# Patient Record
Sex: Female | Born: 1985
Health system: Southern US, Community
[De-identification: ages and names within clinical notes are randomized; demographics above are authoritative.]

## PROBLEM LIST (undated history)

## (undated) DIAGNOSIS — G932 Benign intracranial hypertension: Secondary | ICD-10-CM

## (undated) DIAGNOSIS — I10 Essential (primary) hypertension: Secondary | ICD-10-CM

## (undated) DIAGNOSIS — T7840XA Allergy, unspecified, initial encounter: Secondary | ICD-10-CM

## (undated) DIAGNOSIS — E669 Obesity, unspecified: Secondary | ICD-10-CM

## (undated) DIAGNOSIS — F419 Anxiety disorder, unspecified: Secondary | ICD-10-CM

## (undated) DIAGNOSIS — G43909 Migraine, unspecified, not intractable, without status migrainosus: Secondary | ICD-10-CM

## (undated) HISTORY — DX: Essential (primary) hypertension: I10

## (undated) HISTORY — DX: Benign intracranial hypertension: G93.2

## (undated) HISTORY — DX: Obesity, unspecified: E66.9

## (undated) HISTORY — PX: TONSILLECTOMY: SUR1361

## (undated) HISTORY — DX: Anxiety disorder, unspecified: F41.9

## (undated) HISTORY — DX: Allergy, unspecified, initial encounter: T78.40XA

## (undated) HISTORY — PX: WISDOM TOOTH EXTRACTION: SHX21

## (undated) HISTORY — DX: Migraine, unspecified, not intractable, without status migrainosus: G43.909

---

## 2013-01-02 ENCOUNTER — Other Ambulatory Visit (HOSPITAL_COMMUNITY)
Admission: RE | Admit: 2013-01-02 | Discharge: 2013-01-02 | Disposition: A | Discharge status: Home / Self Care | Payer: BC Managed Care – PPO | Source: Ambulatory Visit | Attending: Gynecology | Admitting: Gynecology

## 2013-01-02 ENCOUNTER — Encounter: Payer: Self-pay | Admitting: Gynecology

## 2013-01-02 ENCOUNTER — Ambulatory Visit (INDEPENDENT_AMBULATORY_CARE_PROVIDER_SITE_OTHER): Payer: BC Managed Care – PPO | Admitting: Gynecology

## 2013-01-02 VITALS — BP 134/90 | Ht 64.0 in | Wt 273.0 lb

## 2013-01-02 DIAGNOSIS — R635 Abnormal weight gain: Secondary | ICD-10-CM | POA: Insufficient documentation

## 2013-01-02 DIAGNOSIS — IMO0001 Reserved for inherently not codable concepts without codable children: Secondary | ICD-10-CM

## 2013-01-02 DIAGNOSIS — Z113 Encounter for screening for infections with a predominantly sexual mode of transmission: Secondary | ICD-10-CM

## 2013-01-02 DIAGNOSIS — Z01419 Encounter for gynecological examination (general) (routine) without abnormal findings: Secondary | ICD-10-CM

## 2013-01-02 DIAGNOSIS — Z309 Encounter for contraceptive management, unspecified: Secondary | ICD-10-CM

## 2013-01-02 LAB — WET PREP FOR TRICH, YEAST, CLUE
Trich, Wet Prep: NONE SEEN
Yeast Wet Prep HPF POC: NONE SEEN

## 2013-01-02 MED ORDER — METRONIDAZOLE 500 MG PO TABS: 500.0000 mg | ORAL_TABLET | Freq: Two times a day (BID) | ORAL | Status: DC

## 2013-01-02 MED ORDER — NORGESTIMATE-ETH ESTRADIOL 0.25-35 MG-MCG PO TABS: 1.0000 | ORAL_TABLET | Freq: Every day | ORAL | Status: DC

## 2013-01-02 NOTE — Patient Instructions (Addendum)
Oral Contraception Use Oral contraceptives (OCs) are medicines taken to prevent pregnancy. OCs work by preventing the ovaries from releasing eggs. The hormones in OCs also cause the cervical mucus to thicken, preventing the sperm from entering the uterus. The hormones also cause the uterine lining to become thin, not allowing a fertilized egg to attach to the inside of the uterus. OCs are highly effective when taken exactly as prescribed. However, OCs do not prevent sexually transmitted diseases (STDs). Safe sex practices, such as using condoms along with an OC, can help prevent STDs.  Before taking OCs, you may have a physical exam and Pap test. Your caregiver may also order blood tests if necessary. Your caregiver will make sure you are a good candidate for oral contraception. Discuss with your caregiver the possible side effects of the OC you may be prescribed. When starting an OC, it can take 2 to 3 months for the body to adjust to the changes in hormone levels in your body.  HOW TO TAKE ORAL CONTRACEPTIVES Your caregiver may advise you on how to start taking the first cycle of OCs. Otherwise, you can:  Start on day 1 of your menstrual period. You will not need any backup contraceptive protection with this start time.  Start on the first Sunday after your menstrual period or the day you get your prescription. In these cases, you will need to use backup contraceptive protection for the first 7-day cycle. After you have started taking OCs:  If you forget to take 1 pill, take it as soon as you remember. Take the next pill at the regular time.  If you miss 2 or more pills, use backup birth control until your next menstrual period starts.  If you use a 28-day pack that contains inactive pills and you miss 1 of the last 7 pills (pills with no hormones), it will not matter. Throw away the rest of the non-hormone pills and start a new pill pack. No matter which day you start the OC, you will always start  a new pack on that same day of the week. Have an extra pack of OCs and a backup contraceptive method available in case you miss some pills or lose your OC pack. HOME CARE INSTRUCTIONS   Do not smoke.  Always use a condom to protect against STDs. OCs do not protect against STDs.  Use a calendar to mark your menstrual period days.  Read the information and directions that come with your OC. Talk to your caregiver if you have questions. SEEK MEDICAL CARE IF:   You develop nausea and vomiting.  You have abnormal vaginal discharge or bleeding.  You develop a rash.  You miss your menstrual period.  You are losing your hair.  You need treatment for mood swings or depression.  You get dizzy when taking the OC.  You develop acne from taking the OC.  You become pregnant. SEEK IMMEDIATE MEDICAL CARE IF:   You develop chest pain.  You develop shortness of breath.  You have an uncontrolled or severe headache.  You develop numbness or slurred speech.  You develop visual problems.  You develop pain, redness, and swelling in the legs. Document Released: 10/14/2011 Document Revised: 01/17/2012 Document Reviewed: 10/14/2011 [facility] Patient Information 2013 [place], [place].  Bacterial Vaginosis Bacterial vaginosis (BV) is a vaginal infection where the normal balance of bacteria in the vagina is disrupted. The normal balance is then replaced by an overgrowth of certain bacteria. There are several different kinds  of bacteria that can cause BV. BV is the most common vaginal infection in women of childbearing age. CAUSES   The cause of BV is not fully understood. BV develops when there is an increase or imbalance of harmful bacteria.  Some activities or behaviors can upset the normal balance of bacteria in the vagina and put women at increased risk including:  Having a new sex partner or multiple sex partners.  Douching.  Using an intrauterine device (IUD) for contraception.  It  is not clear what role sexual activity plays in the development of BV. However, women that have never had sexual intercourse are rarely infected with BV. Women do not get BV from toilet seats, bedding, swimming pools or from touching objects around them.  SYMPTOMS   Grey vaginal discharge.  A fish-like odor with discharge, especially after sexual intercourse.  Itching or burning of the vagina and vulva.  Burning or pain with urination.  Some women have no signs or symptoms at all. DIAGNOSIS  Your caregiver must examine the vagina for signs of BV. Your caregiver will perform lab tests and look at the sample of vaginal fluid through a microscope. They will look for bacteria and abnormal cells (clue cells), a pH test higher than 4.5, and a positive amine test all associated with BV.  RISKS AND COMPLICATIONS   Pelvic inflammatory disease (PID).  Infections following gynecology surgery.  Developing HIV.  Developing herpes virus. TREATMENT  Sometimes BV will clear up without treatment. However, all women with symptoms of BV should be treated to avoid complications, especially if gynecology surgery is planned. Female partners generally do not need to be treated. However, BV may spread between female sex partners so treatment is helpful in preventing a recurrence of BV.   BV may be treated with antibiotics. The antibiotics come in either pill or vaginal cream forms. Either can be used with nonpregnant or pregnant women, but the recommended dosages differ. These antibiotics are not harmful to the baby.  BV can recur after treatment. If this happens, a second round of antibiotics will often be prescribed.  Treatment is important for pregnant women. If not treated, BV can cause a premature delivery, especially for a pregnant woman who had a premature birth in the past. All pregnant women who have symptoms of BV should be checked and treated.  For chronic reoccurrence of BV, treatment with a type  of prescribed gel vaginally twice a week is helpful. HOME CARE INSTRUCTIONS   Finish all medication as directed by your caregiver.  Do not have sex until treatment is completed.  Tell your sexual partner that you have a vaginal infection. They should see their caregiver and be treated if they have problems, such as a mild rash or itching.  Practice safe sex. Use condoms. Only have 1 sex partner. PREVENTION  Basic prevention steps can help reduce the risk of upsetting the natural balance of bacteria in the vagina and developing BV:  Do not have sexual intercourse (be abstinent).  Do not douche.  Use all of the medicine prescribed for treatment of BV, even if the signs and symptoms go away.  Tell your sex partner if you have BV. That way, they can be treated, if needed, to prevent reoccurrence. SEEK MEDICAL CARE IF:   Your symptoms are not improving after 3 days of treatment.  You have increased discharge, pain, or fever. MAKE SURE YOU:   Understand these instructions.  Will watch your condition.  Will get help  right away if you are not doing well or get worse. FOR MORE INFORMATION  Division of STD Prevention (DSTDP), Centers for Disease Control and Prevention: [employer] American Social Health Association (ASHA): www.ashastd.org  Document Released: 10/25/2005 Document Revised: 01/17/2012 Document Reviewed: 04/17/2009 [facility] Patient Information 2013 [place], [place].

## 2013-01-02 NOTE — Progress Notes (Signed)
Anita Hodges [birth year] [id number]   History:    27 y.o.  for annual gyn exam who is new to the practice and wanted discuss contraceptive options. She stated she had been on several oral contraceptive pills in the past. She states her last Pap smear was in 2012 which was normal. She denies any prior history of abnormal Pap smears. She's currently weighing 273 pounds with a BMI of 46.86 kg/meter square. She stated that her Tdap vaccine was administered in 2012.  Past medical history,surgical history, family history and social history were all reviewed and documented in the EPIC chart.  Gynecologic History Patient's last menstrual period was 12/15/2012. Contraception: none Last Pap: 2012. Results were: normal Last mammogram: Not indicated. Results were: Not indicated  Obstetric History OB History   Grav Para Term Preterm Abortions TAB SAB Ect Mult Living                   ROS: A ROS was performed and pertinent positives and negatives are included in the history.  GENERAL: No fevers or chills. HEENT: No change in vision, no earache, sore throat or sinus congestion. NECK: No pain or stiffness. CARDIOVASCULAR: No chest pain or pressure. No palpitations. PULMONARY: No shortness of breath, cough or wheeze. GASTROINTESTINAL: No abdominal pain, nausea, vomiting or diarrhea, melena or bright red blood per rectum. GENITOURINARY: No urinary frequency, urgency, hesitancy or dysuria. MUSCULOSKELETAL: No joint or muscle pain, no back pain, no recent trauma. DERMATOLOGIC: No rash, no itching, no lesions. ENDOCRINE: No polyuria, polydipsia, no heat or cold intolerance. No recent change in weight. HEMATOLOGICAL: No anemia or easy bruising or bleeding. NEUROLOGIC: No headache, seizures, numbness, tingling or weakness. PSYCHIATRIC: No depression, no loss of interest in normal activity or change in sleep pattern.     Exam: chaperone present  BP 134/90  Ht 5\' 4"  (1.626 m)  Wt 273 lb (123.832 kg)  BMI 46.84  kg/m2  LMP 12/15/2012  Body mass index is 46.84 kg/(m^2).  General appearance : Well developed well nourished female. No acute distress HEENT: Neck supple, trachea midline, no carotid bruits, no thyroidmegaly Lungs: Clear to auscultation, no rhonchi or wheezes, or rib retractions  Heart: Regular rate and rhythm, no murmurs or gallops Breast:Examined in sitting and supine position were symmetrical in appearance, no palpable masses or tenderness,  no skin retraction, no nipple inversion, no nipple discharge, no skin discoloration, no axillary or supraclavicular lymphadenopathy Abdomen: no palpable masses or tenderness, no rebound or guarding Extremities: no edema or skin discoloration or tenderness  Pelvic:  Bartholin, Urethra, Skene Glands: Within normal limits             Vagina: No gross lesions or discharge  Cervix: No gross lesions or discharge  Uterus  anteverted, normal size, shape and consistency, non-tender and mobile  Adnexa  Without masses or tenderness  Anus and perineum  normal   Rectovaginal  normal sphincter tone without palpated masses or tenderness             Hemoccult not indicated   Wet prep today demonstrated moderate clue cells moderate white blood cells and too numerous to count bacteria  GC and Chlamydia culture pending at time of this dictation  Assessment/Plan:  27 y.o. female for annual exam who wanted to have an STD screen today. The wet prep demonstrated evidence of bacterial vaginosis. She will be placed on Flagyl 500 mg one by mouth twice a day for 5 days. As part of her STD workup  the following was also were: Hepatitis B, C., RPR, and HIV. Also a CBC and hemoglobin A1c and TSH were obtained along with a urinalysis and Pap smear. She was prescribe Sprintec 28 day oral contraceptive pill. Patient denies any bleeding or clotting disorders in her or any member of her family and has had no problems with oral contraceptive pills in the past. She was reminded on the  importance of monthly self breast examination.    Ok Edwards MD, 10:09 PM 01/02/2013

## 2013-01-03 ENCOUNTER — Other Ambulatory Visit: Payer: Self-pay | Admitting: Gynecology

## 2013-01-03 ENCOUNTER — Telehealth: Payer: Self-pay | Admitting: Gynecology

## 2013-01-03 ENCOUNTER — Telehealth: Payer: Self-pay

## 2013-01-03 DIAGNOSIS — D72829 Elevated white blood cell count, unspecified: Secondary | ICD-10-CM

## 2013-01-03 DIAGNOSIS — R7989 Other specified abnormal findings of blood chemistry: Secondary | ICD-10-CM

## 2013-01-03 LAB — URINALYSIS W MICROSCOPIC + REFLEX CULTURE
Bacteria, UA: NONE SEEN
Bilirubin Urine: NEGATIVE
Casts: NONE SEEN
Glucose, UA: NEGATIVE mg/dL
Hgb urine dipstick: NEGATIVE
Ketones, ur: NEGATIVE mg/dL
Nitrite: NEGATIVE
Protein, ur: NEGATIVE mg/dL
Specific Gravity, Urine: >1.03 — ABNORMAL HIGH (ref 1.005–1.030)
Urobilinogen, UA: 0.2 mg/dL (ref 0.0–1.0)
pH: 5 (ref 5.0–8.0)

## 2013-01-03 LAB — URINE CULTURE

## 2013-01-03 LAB — CBC WITH DIFFERENTIAL/PLATELET
Basophils Absolute: 0 10*3/uL (ref 0.0–0.1)
Basophils Relative: 0 % (ref 0–1)
Eosinophils Absolute: 0.1 10*3/uL (ref 0.0–0.7)
Eosinophils Relative: 1 % (ref 0–5)
HCT: 41.4 % (ref 36.0–46.0)
Hemoglobin: 14.2 g/dL (ref 12.0–15.0)
Lymphocytes Relative: 26 % (ref 12–46)
Lymphs Abs: 3.1 10*3/uL (ref 0.7–4.0)
MCH: 27.9 pg (ref 26.0–34.0)
MCHC: 34.3 g/dL (ref 30.0–36.0)
MCV: 81.3 fL (ref 78.0–100.0)
Monocytes Absolute: 0.9 10*3/uL (ref 0.1–1.0)
Monocytes Relative: 8 % (ref 3–12)
Neutro Abs: 7.7 10*3/uL (ref 1.7–7.7)
Neutrophils Relative %: 65 % (ref 43–77)
Platelets: 434 10*3/uL — ABNORMAL HIGH (ref 150–400)
RBC: 5.09 MIL/uL (ref 3.87–5.11)
RDW: 13.8 % (ref 11.5–15.5)
WBC: 11.9 10*3/uL — ABNORMAL HIGH (ref 4.0–10.5)

## 2013-01-03 LAB — HEMOGLOBIN A1C
Hgb A1c MFr Bld: 5.6 % (ref <5.7)
Mean Plasma Glucose: 114 mg/dL (ref <117)

## 2013-01-03 LAB — TSH: TSH: 2.55 u[IU]/mL (ref 0.350–4.500)

## 2013-01-03 LAB — HEPATITIS C ANTIBODY: HCV Ab: NEGATIVE

## 2013-01-03 LAB — HIV ANTIBODY (ROUTINE TESTING W REFLEX): HIV: NONREACTIVE

## 2013-01-03 LAB — HEPATITIS B SURFACE ANTIGEN: Hepatitis B Surface Ag: NEGATIVE

## 2013-01-03 LAB — GC/CHLAMYDIA PROBE AMP
CT Probe RNA: NEGATIVE
GC Probe RNA: NEGATIVE

## 2013-01-03 LAB — RPR

## 2013-01-03 NOTE — Telephone Encounter (Signed)
I LM on pt cell phone that her [facility] ins will cover either the Paraguard or Mirena IUD & insertion at 100%,no copay. She was advised to call & let us know which IUD she wants and to call first day of her next cycle to insert.WL

## 2013-01-03 NOTE — Telephone Encounter (Signed)
error 

## 2013-01-29 ENCOUNTER — Other Ambulatory Visit: Payer: Self-pay

## 2013-01-29 ENCOUNTER — Encounter: Payer: Self-pay | Admitting: Gynecology

## 2013-01-29 ENCOUNTER — Ambulatory Visit (INDEPENDENT_AMBULATORY_CARE_PROVIDER_SITE_OTHER): Payer: BC Managed Care – PPO | Admitting: Gynecology

## 2013-01-29 VITALS — BP 134/86

## 2013-01-29 DIAGNOSIS — R635 Abnormal weight gain: Secondary | ICD-10-CM

## 2013-01-29 DIAGNOSIS — N912 Amenorrhea, unspecified: Secondary | ICD-10-CM

## 2013-01-29 DIAGNOSIS — R7989 Other specified abnormal findings of blood chemistry: Secondary | ICD-10-CM

## 2013-01-29 DIAGNOSIS — D473 Essential (hemorrhagic) thrombocythemia: Secondary | ICD-10-CM

## 2013-01-29 DIAGNOSIS — D72829 Elevated white blood cell count, unspecified: Secondary | ICD-10-CM

## 2013-01-29 DIAGNOSIS — E663 Overweight: Secondary | ICD-10-CM | POA: Insufficient documentation

## 2013-01-29 LAB — CBC WITH DIFFERENTIAL/PLATELET
Basophils Absolute: 0.1 10*3/uL (ref 0.0–0.1)
Basophils Relative: 1 % (ref 0–1)
Eosinophils Absolute: 0.1 10*3/uL (ref 0.0–0.7)
Eosinophils Relative: 1 % (ref 0–5)
HCT: 39.7 % (ref 36.0–46.0)
Hemoglobin: 13.4 g/dL (ref 12.0–15.0)
Lymphocytes Relative: 28 % (ref 12–46)
Lymphs Abs: 3.1 10*3/uL (ref 0.7–4.0)
MCH: 27.7 pg (ref 26.0–34.0)
MCHC: 33.8 g/dL (ref 30.0–36.0)
MCV: 82 fL (ref 78.0–100.0)
Monocytes Absolute: 0.6 10*3/uL (ref 0.1–1.0)
Monocytes Relative: 6 % (ref 3–12)
Neutro Abs: 7.2 10*3/uL (ref 1.7–7.7)
Neutrophils Relative %: 64 % (ref 43–77)
Platelets: 387 10*3/uL (ref 150–400)
RBC: 4.84 MIL/uL (ref 3.87–5.11)
RDW: 14.2 % (ref 11.5–15.5)
WBC: 11.1 10*3/uL — ABNORMAL HIGH (ref 4.0–10.5)

## 2013-01-29 LAB — PREGNANCY, URINE: Preg Test, Ur: NEGATIVE

## 2013-01-29 MED ORDER — PHENTERMINE HCL 37.5 MG PO CAPS: 37.5000 mg | ORAL_CAPSULE | ORAL | Status: DC

## 2013-01-29 NOTE — Progress Notes (Signed)
Patient presented to the office today stating that she has not had a menstrual cycle since February 7. Patient states she otherwise has had normal cycles. She was seen in the office for her annual exam on February 25 and was given a prescription for sprintec 28 day oral contraceptive pill to start on the second day of her menses. She has not had a menstrual cycle to start the oral contraceptive pill and stated that at home she had a urine pregnancy test one week ago was negative. Patient denies any nausea vomiting occasional breast tenderness. She has had unprotected intercourse. She denies any nipple discharge or any double vision occasional frontal headaches but she attributes to seasonal allergy.  Patient weighs 273 pounds with a BMI of 46.84 Today's blood pressure 134/86 Heart regular rate and rhythm no murmurs or gallops Neck: No carotid bruits Lungs: Clear to auscultation no rhonchi or wheezes  Urine pregnancy test was negative in the office today.  Exam: Pelvic: Bartholin urethra Skene was within normal limits Vagina: No lesions or discharge Cervix: No lesions or discharge Uterus: Anteverted normal size shape and consistency Adnexa: No palpable masses or tenderness Rectal exam: Not done  Patient stated that several years ago she was on phentermine 37.5 mg daily for 2 months and lost significant amount of weight. She does not exercise on a regular basis.  Assessment/plan: Patient morbidly obese may be contributing to her anovulatory cycle. Patient had a normal TSH at time of her annual exam as well as blood sugar but her CBC demonstrated slightly elevated white blood count and patient stated she had just got over an upper respiratory tract infection so we will be checking her CBC today. We'll check her prolactin level today. She is going to be referred to the [facility] nutrition center for nutritional guidance. I've also given the telephone number of the bariatric clinic for her to schedule an  appointment. I  do believe she would be an ideal candidate for gastric lap banding. Meanwhile shew ould like to go on phentermine. I will prescribe her 37.5 mg to take 1 by mouth daily for 3 months but she will return to the office monthly for cardiac and pulmonary auscultation. Patient was counseled as to the potential risk of pulmonary hypertension and cardiac valvular heart disease when taken for long periods of time. Patient fully understands and accepts.literature information was provided on the medication. We'll also obtain a qualitative beta hCG today. If it is negative she will take Provera 10 mg daily for the next 5-10 days to initiate her menstrual cycle. On the second day of her cycle she will start her Sprintec 28 day oral contraceptive pill. She was provided as well with literature formation on diet and exercise.

## 2013-01-29 NOTE — Patient Instructions (Addendum)
Exercise to Lose Weight Exercise and a healthy diet may help you lose weight. Your doctor may suggest specific exercises. EXERCISE IDEAS AND TIPS  Choose low-cost things you enjoy doing, such as walking, bicycling, or exercising to workout videos.   Take stairs instead of the elevator.   Walk during your lunch break.   Park your car further away from work or school.   Go to a gym or an exercise class.   Start with 5 to 10 minutes of exercise each day. Build up to 30 minutes of exercise 4 to 6 days a week.   Wear shoes with good support and comfortable clothes.   Stretch before and after working out.   Work out until you breathe harder and your heart beats faster.   Drink extra water when you exercise.   Do not do so much that you hurt yourself, feel dizzy, or get very short of breath.  Exercises that burn about 150 calories:  Running 1  miles in 15 minutes.   Playing volleyball for 45 to 60 minutes.   Washing and waxing a car for 45 to 60 minutes.   Playing touch football for 45 minutes.   Walking 1  miles in 35 minutes.   Pushing a stroller 1  miles in 30 minutes.   Playing basketball for 30 minutes.   Raking leaves for 30 minutes.   Bicycling 5 miles in 30 minutes.   Walking 2 miles in 30 minutes.   Dancing for 30 minutes.   Shoveling snow for 15 minutes.   Swimming laps for 20 minutes.   Walking up stairs for 15 minutes.   Bicycling 4 miles in 15 minutes.   Gardening for 30 to 45 minutes.   Jumping rope for 15 minutes.   Washing windows or floors for 45 to 60 minutes.  Document Released: 11/27/2010 Document Revised: 07/07/2011 Document Reviewed: 11/27/2010 [facility] Patient Information 2012 [place].                                          Patient information: High cholesterol (The Basics)  What is cholesterol? - Cholesterol is a substance that is found in the blood. Everyone has some. It is needed for good health. The problem is, people  sometimes have too much cholesterol. Compared with people with normal cholesterol, people with high cholesterol have a higher risk of heart attacks, strokes, and other health problems. The higher your cholesterol, the higher your risk of these problems. Cholesterol levels in your body are determined significantly by your diet. Cholesterol levels may also be related to heart disease. The following material helps to explain this relationship and discusses what you can do to help keep your heart healthy. Not all cholesterol is bad. Low-density lipoprotein (LDL) cholesterol is the "bad" cholesterol. It may cause fatty deposits to build up inside your arteries. High-density lipoprotein (HDL) cholesterol is "good." It helps to remove the "bad" LDL cholesterol from your blood. Cholesterol is a very important risk factor for heart disease. Other risk factors are high blood pressure, smoking, stress, heredity, and weight.  The heart muscle gets its supply of blood through the coronary arteries. If your LDL cholesterol is high and your HDL cholesterol is low, you are at risk for having fatty deposits build up in your coronary arteries. This leaves less room through which blood can flow. Without sufficient blood and  oxygen, the heart muscle cannot function properly and you may feel chest pains (angina pectoris). When a coronary artery closes up entirely, a part of the heart muscle may die, causing a heart attack (myocardial infarction).  CHECKING CHOLESTEROL When your caregiver sends your blood to a lab to be analyzed for cholesterol, a complete lipid (fat) profile may be done. With this test, the total amount of cholesterol and levels of LDL and HDL are determined. Triglycerides are a type of fat that circulates in the blood and can also be used to determine heart disease risk. Are there different types of cholesterol? - Yes, there are a few different types. If you get a cholesterol test, you may hear your doctor or  nurse talk about: Total cholesterol  LDL cholesterol - Some people call this the "bad" cholesterol. That's because having high LDL levels raises your risk of heart attacks, strokes, and other health problems.  HDL cholesterol - Some people call this the "good" cholesterol. That's because having high HDL levels lowers your risk of heart attacks, strokes, and other health problems.  Non-HDL cholesterol - Non-HDL cholesterol is your total cholesterol minus your HDL cholesterol.  Triglycerides - Triglycerides are not cholesterol. They are a type of fat. But they often get measured when cholesterol is measured. (Having high triglycerides also seems to increase the risk of heart attacks and strokes.)   Keep in mind, though, that many people who cannot meet these goals still have a low risk of heart attacks and strokes. What should I do if my doctor tells me I have high cholesterol? - Ask your doctor what your overall risk of heart attacks and strokes is. High cholesterol, by itself, is not always a reason to worry. Having high cholesterol is just one of many things that can increase your risk of heart attacks and strokes. Other factors that increase your risk include:  Cigarette smoking  High blood pressure  Having a parent, sister, or brother who got heart disease at a young age (Young, in this case, means younger than 55 for men and younger than 65 for women.)  Being a man (Women are at risk, too, but men have a higher risk.)  Older age  If you are at high risk of heart attacks and strokes, having high cholesterol is a problem. On the other hand, if you have are at low risk, having high cholesterol may not mean much. Should I take medicine to lower cholesterol? - Not everyone who has high cholesterol needs medicines. Your doctor or nurse will decide if you need them based on your age, family history, and other health concerns.  You should probably take a cholesterol-lowering medicine called a statin if  you: Already had a heart attack or stroke  Have known heart disease  Have diabetes  Have a condition called peripheral artery disease, which makes it painful to walk, and happens when the arteries in your legs get clogged with fatty deposits  Have an abdominal aortic aneurysm, which is a widening of the main artery in the belly  Most people with any of the conditions listed above should take a statin no matter what their cholesterol level is. If your doctor or nurse puts you on a statin, stay on it. The medicine may not make you feel any different. But it can help prevent heart attacks, strokes, and death.  Can I lower my cholesterol without medicines? - Yes, you can lower your cholesterol some by:  Avoiding red meat, butter,   fried foods, cheese, and other foods that have a lot of saturated fat  Losing weight (if you are overweight)  Being more active Even if these steps do little to change your cholesterol, they can improve your health in many ways.                                                   Cholesterol Control Diet  CONTROLLING CHOLESTEROL WITH DIET Although exercise and lifestyle factors are important, your diet is key. That is because certain foods are known to raise cholesterol and others to lower it. The goal is to balance foods for their effect on cholesterol and more importantly, to replace saturated and trans fat with other types of fat, such as monounsaturated fat, polyunsaturated fat, and omega-3 fatty acids. On average, a person should consume no more than 15 to 17 g of saturated fat daily. Saturated and trans fats are considered "bad" fats, and they will raise LDL cholesterol. Saturated fats are primarily found in animal products such as meats, butter, and cream. However, that does not mean you need to sacrifice all your favorite foods. Today, there are good tasting, low-fat, low-cholesterol substitutes for most of the things you like to eat. Choose low-fat or nonfat  alternatives. Choose round or loin cuts of red meat, since these types of cuts are lowest in fat and cholesterol. Chicken (without the skin), fish, veal, and ground [place] breast are excellent choices. Eliminate fatty meats, such as hot dogs and salami. Even shellfish have little or no saturated fat. Have a 3 oz (85 g) portion when you eat lean meat, poultry, or fish. Trans fats are also called "partially hydrogenated oils." They are oils that have been scientifically manipulated so that they are solid at room temperature resulting in a longer shelf life and improved taste and texture of foods in which they are added. Trans fats are found in stick margarine, some tub margarines, cookies, crackers, and baked goods.  When baking and cooking, oils are an excellent substitute for butter. The monounsaturated oils are especially beneficial since it is believed they lower LDL and raise HDL. The oils you should avoid entirely are saturated tropical oils, such as coconut and palm.  Remember to eat liberally from food groups that are naturally free of saturated and trans fat, including fish, fruit, vegetables, beans, grains (barley, rice, couscous, bulgur wheat), and pasta (without cream sauces).  IDENTIFYING FOODS THAT LOWER CHOLESTEROL  Soluble fiber may lower your cholesterol. This type of fiber is found in fruits such as apples, vegetables such as broccoli, potatoes, and carrots, legumes such as beans, peas, and lentils, and grains such as barley. Foods fortified with plant sterols (phytosterol) may also lower cholesterol. You should eat at least 2 g per day of these foods for a cholesterol lowering effect.  Read package labels to identify low-saturated fats, trans fats free, and low-fat foods at the supermarket. Select cheeses that have only 2 to 3 g saturated fat per ounce. Use a heart-healthy tub margarine that is free of trans fats or partially hydrogenated oil. When buying baked goods (cookies, crackers), avoid  partially hydrogenated oils. Breads and muffins should be made from whole grains (whole-wheat or whole oat flour, instead of "flour" or "enriched flour"). Buy non-creamy canned soups with reduced salt and no added fats.  FOOD PREPARATION TECHNIQUES  Never deep-fry. If you must fry, either stir-fry, which uses very little fat, or use non-stick cooking sprays. When possible, broil, bake, or roast meats, and steam vegetables. Instead of dressing vegetables with butter or margarine, use lemon and herbs, applesauce and cinnamon (for squash and sweet potatoes), nonfat yogurt, salsa, and low-fat dressings for salads.  LOW-SATURATED FAT / LOW-FAT FOOD SUBSTITUTES Meats / Saturated Fat (g)  Avoid: Steak, marbled (3 oz/85 g) / 11 g   Choose: Steak, lean (3 oz/85 g) / 4 g   Avoid: Hamburger (3 oz/85 g) / 7 g   Choose: Hamburger, lean (3 oz/85 g) / 5 g   Avoid: Ham (3 oz/85 g) / 6 g   Choose: Ham, lean cut (3 oz/85 g) / 2.4 g   Avoid: Chicken, with skin, dark meat (3 oz/85 g) / 4 g   Choose: Chicken, skin removed, dark meat (3 oz/85 g) / 2 g   Avoid: Chicken, with skin, light meat (3 oz/85 g) / 2.5 g   Choose: Chicken, skin removed, light meat (3 oz/85 g) / 1 g  Dairy / Saturated Fat (g)  Avoid: Whole milk (1 cup) / 5 g   Choose: Low-fat milk, 2% (1 cup) / 3 g   Choose: Low-fat milk, 1% (1 cup) / 1.5 g   Choose: Skim milk (1 cup) / 0.3 g   Avoid: Hard cheese (1 oz/28 g) / 6 g   Choose: Skim milk cheese (1 oz/28 g) / 2 to 3 g   Avoid: Cottage cheese, 4% fat (1 cup) / 6.5 g   Choose: Low-fat cottage cheese, 1% fat (1 cup) / 1.5 g   Avoid: Ice cream (1 cup) / 9 g   Choose: Sherbet (1 cup) / 2.5 g   Choose: Nonfat frozen yogurt (1 cup) / 0.3 g   Choose: Frozen fruit bar / trace   Avoid: Whipped cream (1 tbs) / 3.5 g   Choose: Nondairy whipped topping (1 tbs) / 1 g  Condiments / Saturated Fat (g)  Avoid: Mayonnaise (1 tbs) / 2 g   Choose: Low-fat mayonnaise (1 tbs) / 1 g    Avoid: Butter (1 tbs) / 7 g   Choose: Extra light margarine (1 tbs) / 1 g   Avoid: Coconut oil (1 tbs) / 11.8 g   Choose: Olive oil (1 tbs) / 1.8 g   Choose: Corn oil (1 tbs) / 1.7 g   Choose: Safflower oil (1 tbs) / 1.2 g   Choose: Sunflower oil (1 tbs) / 1.4 g   Choose: Soybean oil (1 tbs) / 2.4 g   Choose: Canola oil (1 tbs) / 1 g                                                                                      Phentermine tablets or capsules What is this medicine? PHENTERMINE (FEN ter meen) decreases your appetite. It is used with a reduced calorie diet and exercise to help you lose weight. This medicine may be used for other purposes; ask your health care provider or pharmacist if you have questions. What should I tell my health care  provider before I take this medicine? They need to know if you have any of these conditions: -agitation -glaucoma -heart disease -high blood pressure -history of substance abuse -lung disease called Primary Pulmonary Hypertension (PPH) -taken an MAOI like Carbex, Eldepryl, Marplan, Nardil, or Parnate in last 14 days -thyroid disease -an unusual or allergic reaction to phentermine, other medicines, foods, dyes, or preservatives -pregnant or trying to get pregnant -breast-feeding How should I use this medicine? Take this medicine by mouth with a glass of water. Follow the directions on the prescription label. This medicine is usually taken 30 minutes before or 1 to 2 hours after breakfast. Avoid taking this medicine in the evening. It may interfere with sleep. Take your doses at regular intervals. Do not take your medicine more often than directed. Talk to your pediatrician regarding the use of this medicine in children. Special care may be needed. Overdosage: If you think you have taken too much of this medicine contact a poison control center or emergency room at once. NOTE: This medicine is only for you. Do not share this medicine with  others. What if I miss a dose? If you miss a dose, take it as soon as you can. If it is almost time for your next dose, take only that dose. Do not take double or extra doses. What may interact with this medicine? Do not take this medicine with any of the following medications: -duloxetine -MAOIs like Carbex, Eldepryl, Marplan, Nardil, and Parnate -medicines for colds or breathing difficulties like pseudoephedrine or phenylephrine -procarbazine -sibutramine -SSRIs like citalopram, escitalopram, fluoxetine, fluvoxamine, paroxetine, and sertraline -stimulants like dexmethylphenidate, methylphenidate or modafinil -venlafaxine This medicine may also interact with the following medications: -medicines for diabetes This list may not describe all possible interactions. Give your health care provider a list of all the medicines, herbs, non-prescription drugs, or dietary supplements you use. Also tell them if you smoke, drink alcohol, or use illegal drugs. Some items may interact with your medicine. What should I watch for while using this medicine? Notify your physician immediately if you become short of breath while doing your normal activities. Do not take this medicine within 6 hours of bedtime. It can keep you from getting to sleep. Avoid drinks that contain caffeine and try to stick to a regular bedtime every night. This medicine was intended to be used in addition to a healthy diet and exercise. The best results are achieved this way. This medicine is only indicated for short-term use. Eventually your weight loss may level out. At that point, the drug will only help you maintain your new weight. Do not increase or in any way change your dose without consulting your doctor. You may get drowsy or dizzy. Do not drive, use machinery, or do anything that needs mental alertness until you know how this medicine affects you. Do not stand or sit up quickly, especially if you are an older patient. This  reduces the risk of dizzy or fainting spells. Alcohol may increase dizziness and drowsiness. Avoid alcoholic drinks. What side effects may I notice from receiving this medicine? Side effects that you should report to your doctor or health care professional as soon as possible: -chest pain, palpitations -depression or severe changes in mood -increased blood pressure -irritability -nervousness or restlessness -severe dizziness -shortness of breath -problems urinating -unusual swelling of the legs -vomiting Side effects that usually do not require medical attention (report to your doctor or health care professional if they continue or are bothersome): -blurred  vision or other eye problems -changes in sexual ability or desire -constipation or diarrhea -difficulty sleeping -dry mouth or unpleasant taste -headache -nausea This list may not describe all possible side effects. Call your doctor for medical advice about side effects. You may report side effects to FDA at 1-800-FDA-1088. Where should I keep my medicine? Keep out of the reach of children. This medicine can be abused. Keep your medicine in a safe place to protect it from theft. Do not share this medicine with anyone. Selling or giving away this medicine is dangerous and against the law. Store at room temperature between 20 and 25 degrees C (68 and 77 degrees F). Keep container tightly closed. Throw away any unused medicine after the expiration date. NOTE: This sheet is a summary. It may not cover all possible information. If you have questions about this medicine, talk to your doctor, pharmacist, or health care provider.  2012, [employer]. (12/09/2010 11:02:44 AM)

## 2013-01-30 LAB — HCG, SERUM, QUALITATIVE: Preg, Serum: NEGATIVE

## 2013-01-31 ENCOUNTER — Telehealth: Payer: Self-pay | Admitting: *Deleted

## 2013-01-31 MED ORDER — MEDROXYPROGESTERONE ACETATE 10 MG PO TABS: 10.0000 mg | ORAL_TABLET | Freq: Every day | ORAL | Status: DC

## 2013-01-31 NOTE — Telephone Encounter (Signed)
Pt said provera never was received at pharmacy. Per office note provera10 mg daily for the next 5-10 days to initiate her menstrual cycle then to start Sprintec on 2nd day of cycle. Pt qualitative beta hCG was negative informed okay to start medication per office note on 01/29/13.

## 2013-02-02 ENCOUNTER — Telehealth: Payer: Self-pay | Admitting: *Deleted

## 2013-02-02 ENCOUNTER — Encounter: Payer: Self-pay | Admitting: Gynecology

## 2013-02-02 DIAGNOSIS — E663 Overweight: Secondary | ICD-10-CM

## 2013-02-02 NOTE — Telephone Encounter (Signed)
Message copied by Aura Camps on Fri Feb 02, 2013 10:44 AM ------      Message from: Richardson Chiquito      Created: Fri Feb 02, 2013 10:26 AM       Victorino Dike can you see the pt request advice on this patient? She is asking about a referral to [facility] nutrition center. Did you get a message from JF?       Sherrilyn Rist ------

## 2013-02-02 NOTE — Telephone Encounter (Signed)
Referral placed for [facility] they will contact patient to schedule.

## 2013-02-06 NOTE — Telephone Encounter (Signed)
appt 02/08/13 @ 8:00 am

## 2013-02-08 ENCOUNTER — Encounter: Payer: Self-pay | Admitting: *Deleted

## 2013-02-08 ENCOUNTER — Encounter: Payer: BC Managed Care – PPO | Attending: Gynecology | Admitting: *Deleted

## 2013-02-08 VITALS — Ht 65.0 in | Wt 274.7 lb

## 2013-02-08 DIAGNOSIS — Z713 Dietary counseling and surveillance: Secondary | ICD-10-CM | POA: Insufficient documentation

## 2013-02-08 DIAGNOSIS — E663 Overweight: Secondary | ICD-10-CM

## 2013-02-08 DIAGNOSIS — E669 Obesity, unspecified: Secondary | ICD-10-CM | POA: Insufficient documentation

## 2013-02-08 NOTE — Progress Notes (Signed)
  Medical Nutrition Therapy:  Appt start time: 0800 end time:  0900.  Assessment:  Primary concerns today: patient here for obesity. Lives with parents, Mom shops and prepares meals. Works at [employer] from 8:30 to 5:30 Monday - Friday. Has an hour commute each way. Does clean up house, enjoys being with friends and boyfriend. Travels to [place] each weekend to see him.  MEDICATIONS: see list   DIETARY INTAKE:  Usual eating pattern includes 3 meals and 0 snacks per day.  Everyday foods include good variety of all food groups.  Avoided foods include fried foods and regular sodas now.    24-hr recall:  B ( AM): biscuit with sausage or chicken OR Special K Bar x 1, water  Snk ( AM): none  L (11 AM): brings from home; left overs OR frozen low calorie meal, water Snk ( PM): none D ( PM): meat, starch and occasionally a vegetable, bread and occasional salad with vinaigrette dressing  Snk ( PM): none Beverages: water, tea with sweetener like Splenda, diet [facility] very rarely  Usual physical activity: nothing right now.  Estimated energy needs: 1400 calories 158 g carbohydrates 105 g protein 39 g fat  Progress Towards Goal(s):  In progress.   Nutritional Diagnosis:  NI-1.5 Excessive energy intake As related to activity level.  As evidenced by BMI of 45.8.    Intervention:  Nutrition counseling for weight loss initiated. Discussed use of Carb Counting as method of increasing her awareness of appropriate food choices, reading food labels, and benefits of increased activity. Asked her to consider what ways she would prefer to increase her activity for 15 minutes every day by the next visit. She states she prefers exercising alone but doesn't have a preference of being inside or outside. . Plan:  Aim for 3 Carb Choices per meal (45 grams) +/- 1 either way  Aim for 0-1 Carbs per snack if hungry Aim for 3 Fat Servings per meal (15 grams) as tolerated  Consider reading food labels for Total  Carbohydrate and Fat Grams of foods Consider options for increasing your activity level   Handouts given during visit include: Carb Counting and Food Label handouts Meal Plan Card  APPs for nutrition and activity information  Monitoring/Evaluation:  Dietary intake, exercise, reading food labels, and body weight in 4 week(s).

## 2013-02-08 NOTE — Patient Instructions (Signed)
Plan:  Aim for 3 Carb Choices per meal (45 grams) +/- 1 either way  Aim for 0-1 Carbs per snack if hungry Aim for 3 Fat Servings per meal (15 grams) as tolerated  Consider reading food labels for Total Carbohydrate and Fat Grams of foods Consider options for increasing your activity level

## 2013-02-28 ENCOUNTER — Encounter: Payer: Self-pay | Admitting: Gynecology

## 2013-02-28 ENCOUNTER — Ambulatory Visit (INDEPENDENT_AMBULATORY_CARE_PROVIDER_SITE_OTHER): Payer: BC Managed Care – PPO | Admitting: Gynecology

## 2013-02-28 VITALS — BP 136/88 | Wt 276.0 lb

## 2013-02-28 DIAGNOSIS — I10 Essential (primary) hypertension: Secondary | ICD-10-CM

## 2013-02-28 DIAGNOSIS — E663 Overweight: Secondary | ICD-10-CM

## 2013-02-28 NOTE — Patient Instructions (Addendum)
Bariatric Surgery (Gastrointestinal Surgery for Severe Obesity) Severe obesity is a longstanding condition. It is difficult to treat through diet and exercise alone. Gastrointestinal surgery is the best option for people who are severely obese and cannot lose weight by traditional means, or who suffer from serious obesity-related health problems. The surgery promotes weight loss by decreasing the absorption of food and, in some operations, interrupting the digestive process. As in other treatments for obesity, the best results are achieved with healthy eating behaviors and regular physical activity.  People who may consider gastrointestinal surgery include those with a body mass index (BMI) above 40 ( yours is 45.93). This is about 100 pounds of overweight for men and 80 pounds for women. People with a BMI between 35 and 40 and who suffer from type 2 diabetes or life-threatening cardiopulmonary (heart and lung) problems, such as severe sleep apnea or obesity-related heart disease, may also be candidates for surgery. (To use the Body Mass Index chart. find your weight on the bottom of the graph. Go straight up from that point until you come to the line that matches your height. Then look to find your weight group). The idea of gastrointestinal surgery to control obesity grew out of results of operations for cancer or severe ulcers that removed large portions of the stomach or small intestine. Patients undergoing these procedures tended to lose weight after surgery. So some physicians began to use such operations to treat severe obesity. The first operation that was widely used for severe obesity was the intestinal bypass. This operation was first used 40 years ago. It produced weight loss by causing malabsorption. The idea was that patients could eat large amounts of food, which would be poorly digested or passed along too fast for the body to absorb many calories. The problem with this surgery was that it caused  a loss of essential nutrients. Also, its side effects were unpredictable and sometimes fatal. The original form of the intestinal bypass operation is no longer used. THE NORMAL DIGESTIVE PROCESS Normally, as food moves along the digestive tract, digestive juices and enzymes digest and absorb calories and nutrients. After we chew and swallow our food, it moves down the esophagus to the stomach. There a strong acid continues the digestive process. The stomach can hold about 3 pints of food at one time. When the stomach contents move to the first portion of the small intestine (duodenum ), bile and pancreatic juice speed up digestion. Most of the iron and calcium in the foods we eat is absorbed in the duodenum. The jejunum and ileum are the remaining two segments of the nearly 20 feet of small intestine. They complete the absorption of almost all calories and nutrients. The food particles that cannot be digested in the small intestine are stored in the large intestine until eliminated.  HOW DOES SURGERY PROMOTE WEIGHT LOSS? Gastrointestinal surgery for obesity is also called bariatric surgery. It alters the digestive process. The operations promote weight loss by closing off parts of the stomach. This will make it smaller. Operations that only reduce stomach size are known as "restrictive operations". They restrict the amount of food the stomach can hold. Some operations combine stomach restriction with a partial bypass of the small intestine. These procedures create a direct connection from the stomach to the lower segment of the small intestine. This causes bypassing portions of the digestive tract that absorb calories and nutrients. These are known as malabsorptive operations. WHAT ARE THE SURGICAL OPTIONS? There are several  types of restrictive and malabsorptive operations. Each one carries its own benefits and risks.  Restrictive Operations  Restrictive operations serve only to restrict food intake. They  do not interfere with the normal digestive process. To perform the surgery, doctors create a small pouch at the top of the stomach where food enters from the esophagus. At first, the pouch holds about 1 ounce of food. It later expands to 2-3 ounces. The lower outlet of the pouch usually has a diameter of only about  inch. This small outlet delays the emptying of food from the pouch and causes a feeling of fullness. As a result of this surgery, most people lose the ability to eat large amounts of food at one time. After an operation, the person usually can eat only  to 1 cup of food without discomfort or nausea. Also, food has to be well chewed. Restrictive operations for obesity include adjustable gastric banding (AGB) and vertical banded gastroplasty (VBG).  Adjustable gastric banding  In this procedure, a hollow band made of special material is placed around the stomach near its upper end. This creates a small pouch and a narrow passage into the larger remainder of the stomach. The band is then inflated with a salt solution. It can be tightened or loosened over time to change the size of the passage by increasing or decreasing the amount of salt solution.  The band is adjusted based on feelings of hunger and weight loss. Patients decide when they need an adjustment and come to their surgeons to evaluate this. The adjustment is done as an office visit. The band is fully reversible with a second surgery if the patient changes his/her mind. There is no cutting or re-routing of the intestine.  Vertical banded gastroplasty  VBG has been the most common restrictive operation for weight control. Both a band and staples are used to create a small stomach pouch. Vertical banded gastroplasty is based on the same principle of restriction as the band. But the stomach is surgically altered with the stapling. This treatment is not reversible.  Restrictive operations lead to weight loss in almost all patients. But they  are less successful than malabsorptive operations in achieving substantial, long-term weight loss. About 30 percent of those who undergo VBG achieve normal weight. About 80 percent achieve some degree of weight loss. Some patients regain weight. Others are unable to adjust their eating habits and fail to lose the desired weight. Successful results depend on the patient's willingness to adopt a long-term plan of healthy eating and regular physical activity.  A common risk of restrictive operations is vomiting. This is caused when the small stomach is overly stretched by food particles that have not been chewed well. Band slippage and saline leakage have been reported after AGB. Risks of VBG include wearing away of the band and breakdown of the staple line. In a small number of cases, stomach juices may leak into the abdomen. This requires an emergency operation. In less than 1 percent of all cases, infection or death from complications may occur. Malabsorptive Operations  Malabsorptive operations are the most common gastrointestinal surgeries for weight loss. They restrict both food intake and the amount of calories and nutrients the body absorbs.  Roux-en-Y gastric bypass (RGB)  This operation is the most common and successful malabsorptive surgery. First, a small stomach pouch is created to restrict food intake. Next, a Y-shaped section of the small intestine is attached to the pouch. This allows food to bypass the  lower stomach, the first segment of the small intestine (duodenum), and the first portion of the jejunum (the second segment of the small intestine). This bypass reduces the amount of calories and nutrients the body absorbs.  Biliopancreatic diversion (BPD)  In this more complicated malabsorptive operation, portions of the stomach are removed. The small pouch that remains is connected directly to the final segment of the small intestine, completely bypassing the duodenum and the jejunum. This  procedure successfully promotes weight loss. But it is less frequently used than other types of surgery because of the high risk for nutritional deficiencies. A variation of BPD includes a "duodenal switch". This leaves a larger portion of the stomach intact, including the pyloric valve. This valve regulates the release of stomach contents into the small intestine. It also keeps a small part of the duodenum in the digestive pathway.  Malabsorptive operations produce more weight loss than restrictive operations. And they are more effective in reversing the health problems associated with severe obesity. Patients who have malabsorptive operations generally lose two-thirds of their excess weight within 2 years.  In addition to the risks of restrictive surgeries, malabsorptive operations also carry greater risk for nutritional deficiencies. This is because the procedure causes food to bypass the duodenum and jejunum. That is where most iron and calcium are absorbed. Menstruating women may develop anemia because not enough vitamin B12 and iron are absorbed. Decreased absorption of calcium may also bring on osteoporosis and metabolic bone disease. Patients are required to take nutritional supplements that usually prevent these deficiencies. Patients who have the biliopancreatic diversion surgery must also take fat-soluble (dissolved by fat) vitamins A, D, E, and K supplements.  RGB and BPD operations may also cause "dumping syndrome". This means that stomach contents move too rapidly through the small intestine. Symptoms include nausea, weakness, sweating, faintness, and sometimes diarrhea after eating. The duodenal switch operation keeps the pyloric valve intact. So it may reduce the likelihood of dumping syndrome.  The more extensive the bypass, the greater the risk is for complications and nutritional deficiencies. Patients with extensive bypasses of the normal digestive process require close monitoring. They  also need life-long use of special foods, supplements, and medications. EXPLORE BENEFITS AND RISKS Surgery to produce weight loss is a serious undertaking. Anyone thinking about surgery should understand what the operation involves. Patients and physicians should carefully consider the following benefits and risks.  Benefits  Right after surgery, most patients lose weight quickly. They continue to lose for 18 to 24 months after the procedure. Most patients regain 5 to 10 percent of the weight they lost. But many maintain a long-term weight loss of about 100 pounds.  Surgery improves most obesity-related conditions. For example, in one study blood sugar levels of 83 percent of obese patients with diabetes returned to normal after surgery. Nearly all patients whose blood sugar levels did not return to normal were older. Or they had lived with diabetes for a long time. Risks  Ten to 20 percent of patients who have weight-loss surgery require follow-up operations to correct complications. Abdominal hernia was the most common complication requiring follow-up surgery. But laparoscopic techniques seem to have solved this problem. In laparoscopy, the surgeon makes one or more small incisions. Slender surgical instruments are passed them. This technique eliminates the need for a large incision. And it creates less tissue damage. Patients who are super obese (greater than 350 pounds) or have had previous abdominal surgery, may not be good candidates for laparoscopy. Less  common complications include breakdown of the staple line and stretched stomach outlets.  Some obese patients who have weight-loss surgery develop gallstones. These are clumps of cholesterol and other matter that form in the gallbladder. During quick or substantial weight loss, one's risk of developing gallstones increases. Taking supplemental bile salts for the first 6 months after surgery can prevent them.  Nearly 30 percent of patients who  have weight-loss surgery develop nutritional deficiencies. These include anemia, osteoporosis, and metabolic bone disease. These usually can be avoided if vitamin and mineral intakes are high enough.  Women of childbearing age should avoid pregnancy until their weight becomes stable. Quick weight loss and nutritional deficiencies can harm a growing fetus.  Other risks of restrictive surgeries include:  Band slippage.  Stomach prolapse.  Band erosion into the lumen of the stomach.  Port infection.  The main risk with malabsorption operations is life threatening. It is the risk of leak from any of the anastomosis. The more involved the operation, the more risk involved.  There is one other risk of having the surgery. If people do not follow a strict diet, they will stretch out their stomach pouches. Then they will not lose weight. MEDICAL COSTS Gastrointestinal surgery costs vary. They depend on the procedure. Medical insurance coverage varies by state and insurance provider. If you are considering gastrointestinal surgery, contact your r egional Medicare or Medicaid office or your insurance plan. Find out from them if the procedure is covered. IS THE SURGERY FOR YOU?  Gastrointestinal surgery may be the next step for people who remain severely obese after trying nonsurgical approaches or have an obesity-related disease. Candidates for surgery have:  A BMI of 40 or more.  A BMI of 35 or more and a life-threatening obesity-related health problem such as:  Diabetes.  Severe sleep apnea.  Heart disease.  Obesity-related physical problems that interfere with:  Employment.  Walking.  Family function. If you fit the profile for surgery, answers to these questions may help you decide whether weight-loss surgery is appropriate for you. Are you:  Unlikely to lose weight successfully without surgery?  Well informed about the surgical procedure? The effects of treatment?  Determined  to lose weight? Improve your health?  Aware of how your life may change after the operation? Adjustment to the side effects of the surgery include the need to chew well and being unable to eat large meals.  Aware of the potential for serious complications? Dietary restrictions? Occasional failures?  Committed to lifelong medical follow-up?  Restrictive operations are very successful with patients who follow a diet created by a dietician. Support groups and follow up with caregivers is important. Remember: There are no guarantees for any method to produce and maintain weight loss. This includes surgery. Success is possible only with:  Maximum cooperation.  Commitment to behavioral change.  Medical follow-up. This cooperation and commitment must be carried out for the rest of your life.  ADDITIONAL RESOURCES [employer] [address] [place], [place] [address] www.asmbs.org  Weight-control Information Network (WIN) [address] [place] [address] [employer] Document Released: 10/25/2005 Document Revised: 01/17/2012 Document Reviewed: 01/18/2007 [facility] Patient Information 2013 [place], [place].

## 2013-02-28 NOTE — Progress Notes (Signed)
Patient presented to the office today for followup one month after initiating phentermine as a result of her obesity. Patient has a BMI of 45. Patient also is a result of this has had oligomenorrhea and was recently started on Sprintec 28 day oral contraceptive pill and is having normal cycle. When she arrived her blood pressure was 136/88. Patient is dealing with seasonal allergies.  Exam:lungs clear to auscultation Rogers or wheezes Heart: Regular rate and rhythm no murmurs or gallops Neck: No carotid bruits  Assessment/plan: On repeat blood pressure patient had a systolic of 150 and diastolic of 90. I'm going to ask the patient to discontinue the phentermine. I will and secured to take her blood pressure 3 times a week for the next 2 weeks and send [place] the readings. Her blood pressure continues to be elevated we will need to discontinue her oral contraceptive pill and look at other alternatives. Once again we discussed importance of regular exercise which she has not begun. She did see the nutritionist for guidance. I've given her information and will so again today on bariatric surgery and recommend that she make an appointment to discuss with a general surgeon since she would be an ideal candidate.

## 2013-03-08 ENCOUNTER — Ambulatory Visit: Payer: BC Managed Care – PPO | Admitting: *Deleted

## 2013-03-12 ENCOUNTER — Ambulatory Visit (INDEPENDENT_AMBULATORY_CARE_PROVIDER_SITE_OTHER): Payer: BC Managed Care – PPO | Admitting: Family Medicine

## 2013-03-12 VITALS — BP 146/92 | HR 102 | Temp 98.7°F | Resp 20 | Ht 65.0 in | Wt 271.0 lb

## 2013-03-12 DIAGNOSIS — F4323 Adjustment disorder with mixed anxiety and depressed mood: Secondary | ICD-10-CM

## 2013-03-12 DIAGNOSIS — F32A Depression, unspecified: Secondary | ICD-10-CM

## 2013-03-12 DIAGNOSIS — F329 Major depressive disorder, single episode, unspecified: Secondary | ICD-10-CM

## 2013-03-12 DIAGNOSIS — F411 Generalized anxiety disorder: Secondary | ICD-10-CM

## 2013-03-12 MED ORDER — SERTRALINE HCL 50 MG PO TABS: 50.0000 mg | ORAL_TABLET | Freq: Every day | ORAL | Status: DC

## 2013-03-12 NOTE — Progress Notes (Signed)
  Subjective:    Patient ID: Mathis Bud, female    DOB: 1986-01-13, 27 y.o.   MRN: [id number]  HPI 27 year old tearful female presents with depression; possible family history on mother's side but mother doesn't say.  Not sleeping well; difficult time falling asleep, wakes up several times during night, wakes up early Started feeling this way after high school Depressed about weight; weight became a problem freshman year in college; no traumatic experiences except "bad breakup" No thoughts of suicide or hurting self Feels agitated all the time over "little stuff"; lacks motivation.  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 Lives at home with parents- ok with this.  No significant relationship at this point Review of Systems Not suicidal or to hurt himself    Objective:   Physical Exam Tearful, good eye contact, able to express herself well 53 on Anxiety Scale- moderate 43 on Depression Scale- extreme      Assessment & Plan:   Depression  Zoloft 50 qhs.   Call if not sleeping better by Thursday.  He also went into detail about diet and the importance of not eating after 8:00, not drinking sodas, and avoiding diet supplements and diet pills.  Signed, Sheila Oats.D.

## 2013-03-12 NOTE — Patient Instructions (Addendum)
Anxiety and Panic Attacks Your caregiver has informed you that you are having an anxiety or panic attack. There may be many forms of this. Most of the time these attacks come suddenly and without warning. They come at any time of day, including periods of sleep, and at any time of life. They may be strong and unexplained. Although panic attacks are very scary, they are physically harmless. Sometimes the cause of your anxiety is not known. Anxiety is a protective mechanism of the body in its fight or flight mechanism. Most of these perceived danger situations are actually nonphysical situations (such as anxiety over losing a job). CAUSES  The causes of an anxiety or panic attack are many. Panic attacks may occur in otherwise healthy people given a certain set of circumstances. There may be a genetic cause for panic attacks. Some medications may also have anxiety as a side effect. SYMPTOMS  Some of the most common feelings are:  Intense terror.  Dizziness, feeling faint.  Hot and cold flashes.  Fear of going crazy.  Feelings that nothing is real.  Sweating.  Shaking.  Chest pain or a fast heartbeat (palpitations).  Smothering, choking sensations.  Feelings of impending doom and that death is near.  Tingling of extremities, this may be from over-breathing.  Altered reality (derealization).  Being detached from yourself (depersonalization). Several symptoms can be present to make up anxiety or panic attacks. DIAGNOSIS  The evaluation by your caregiver will depend on the type of symptoms you are experiencing. The diagnosis of anxiety or panic attack is made when no physical illness can be determined to be a cause of the symptoms. TREATMENT  Treatment to prevent anxiety and panic attacks may include:  Avoidance of circumstances that cause anxiety.  Reassurance and relaxation.  Regular exercise.  Relaxation therapies, such as yoga.  Psychotherapy with a psychiatrist or  therapist.  Avoidance of caffeine, alcohol and illegal drugs.  Prescribed medication. SEEK IMMEDIATE MEDICAL CARE IF:   You experience panic attack symptoms that are different than your usual symptoms.  You have any worsening or concerning symptoms. Document Released: 10/25/2005 Document Revised: 01/17/2012 Document Reviewed: 02/26/2010 [facility] Patient Information 2013 [place], [place]. Depression, Adult Depression refers to feeling sad, low, down in the dumps, blue, gloomy, or empty. In general, there are two kinds of depression: 1. Depression that we all experience from time to time because of upsetting life experiences, including the loss of a job or the ending of a relationship (normal sadness or normal grief). This kind of depression is considered normal, is short lived, and resolves within a few days to 2 weeks. (Depression experienced after the loss of a loved one is called bereavement. Bereavement often lasts longer than 2 weeks but normally gets better with time.) 2. Clinical depression, which lasts longer than normal sadness or normal grief or interferes with your ability to function at home, at work, and in school. It also interferes with your personal relationships. It affects almost every aspect of your life. Clinical depression is an illness. Symptoms of depression also can be caused by conditions other than normal sadness and grief or clinical depression. Examples of these conditions are listed as follows:  Physical illness Some physical illnesses, including underactive thyroid gland (hypothyroidism), severe anemia, specific types of cancer, diabetes, uncontrolled seizures, heart and lung problems, strokes, and chronic pain are commonly associated with symptoms of depression.  Side effects of some prescription medicine In some people, certain types of prescription medicine can cause symptoms  of depression.  Substance abuse Abuse of alcohol and illicit drugs can cause symptoms of  depression. SYMPTOMS Symptoms of normal sadness and normal grief include the following:  Feeling sad or crying for short periods of time.  Not caring about anything (apathy).  Difficulty sleeping or sleeping too much.  No longer able to enjoy the things you used to enjoy.  Desire to be by oneself all the time (social isolation).  Lack of energy or motivation.  Difficulty concentrating or remembering.  Change in appetite or weight.  Restlessness or agitation. Symptoms of clinical depression include the same symptoms of normal sadness or normal grief and also the following symptoms:  Feeling sad or crying all the time.  Feelings of guilt or worthlessness.  Feelings of hopelessness or helplessness.  Thoughts of suicide or the desire to harm yourself (suicidal ideation).  Loss of touch with reality (psychotic symptoms). Seeing or hearing things that are not real (hallucinations) or having false beliefs about your life or the people around you (delusions and paranoia). DIAGNOSIS  The diagnosis of clinical depression usually is based on the severity and duration of the symptoms. Your caregiver also will ask you questions about your medical history and substance use to find out if physical illness, use of prescription medicine, or substance abuse is causing your depression. Your caregiver also may order blood tests. TREATMENT  Typically, normal sadness and normal grief do not require treatment. However, sometimes antidepressant medicine is prescribed for bereavement to ease the depressive symptoms until they resolve. The treatment for clinical depression depends on the severity of your symptoms but typically includes antidepressant medicine, counseling with a mental health professional, or a combination of both. Your caregiver will help to determine what treatment is best for you. Depression caused by physical illness usually goes away with appropriate medical treatment of the illness.  If prescription medicine is causing depression, talk with your caregiver about stopping the medicine, decreasing the dose, or substituting another medicine. Depression caused by abuse of alcohol or illicit drugs abuse goes away with abstinence from these substances. Some adults need professional help in order to stop drinking or using drugs. SEEK IMMEDIATE CARE IF:  You have thoughts about hurting yourself or others.  You lose touch with reality (have psychotic symptoms).  You are taking medicine for depression and have a serious side effect. FOR MORE INFORMATION National Alliance on Mental Illness: www.nami.[employer]: http://www.maynard.net/ Document Released: 10/22/2000 Document Revised: 04/25/2012 Document Reviewed: 01/24/2012 [facility] Patient Information 2013 [place], [place].

## 2013-06-07 ENCOUNTER — Ambulatory Visit (INDEPENDENT_AMBULATORY_CARE_PROVIDER_SITE_OTHER): Payer: BC Managed Care – PPO | Admitting: Emergency Medicine

## 2013-06-07 VITALS — BP 124/84 | HR 90 | Temp 98.6°F | Resp 16 | Ht 65.0 in | Wt 283.6 lb

## 2013-06-07 DIAGNOSIS — J039 Acute tonsillitis, unspecified: Secondary | ICD-10-CM

## 2013-06-07 DIAGNOSIS — R609 Edema, unspecified: Secondary | ICD-10-CM

## 2013-06-07 LAB — POCT CBC
Granulocyte percent: 68.6 %G (ref 37–80)
HCT, POC: 44.1 % (ref 37.7–47.9)
Hemoglobin: 14.3 g/dL (ref 12.2–16.2)
Lymph, poc: 3.3 (ref 0.6–3.4)
MCH, POC: 28.2 pg (ref 27–31.2)
MCHC: 32.4 g/dL (ref 31.8–35.4)
MCV: 86.9 fL (ref 80–97)
MID (cbc): 0.6 (ref 0–0.9)
MPV: 8.4 fL (ref 0–99.8)
POC Granulocyte: 8.5 — AB (ref 2–6.9)
POC LYMPH PERCENT: 26.5 %L (ref 10–50)
POC MID %: 4.9 %M (ref 0–12)
Platelet Count, POC: 442 10*3/uL — AB (ref 142–424)
RBC: 5.07 M/uL (ref 4.04–5.48)
RDW, POC: 13.3 %
WBC: 12.4 10*3/uL — AB (ref 4.6–10.2)

## 2013-06-07 MED ORDER — HYDROCHLOROTHIAZIDE 25 MG PO TABS: 25.0000 mg | ORAL_TABLET | Freq: Every day | ORAL | Status: DC

## 2013-06-07 MED ORDER — PENICILLIN V POTASSIUM 500 MG PO TABS: 500.0000 mg | ORAL_TABLET | Freq: Four times a day (QID) | ORAL | Status: DC

## 2013-06-07 NOTE — Patient Instructions (Addendum)
Edema  Edema is an abnormal build-up of fluids in tissues. Because this is partly dependent on gravity (water flows to the lowest place), it is more common in the legs and thighs (lower extremities). It is also common in the looser tissues, like around the eyes. Painless swelling of the feet and ankles is common and increases as a person ages. It may affect both legs and may include the calves or even thighs. When squeezed, the fluid may move out of the affected area and may leave a dent for a few moments.  CAUSES    Prolonged standing or sitting in one place for extended periods of time. Movement helps pump tissue fluid into the veins, and absence of movement prevents this, resulting in edema.   Varicose veins. The valves in the veins do not work as well as they should. This causes fluid to leak into the tissues.   Fluid and salt overload.   Injury, burn, or surgery to the leg, ankle, or foot, may damage veins and allow fluid to leak out.   Sunburn damages vessels. Leaky vessels allow fluid to go out into the sunburned tissues.   Allergies (from insect bites or stings, medications or chemicals) cause swelling by allowing vessels to become leaky.   Protein in the blood helps keep fluid in your vessels. Low protein, as in malnutrition, allows fluid to leak out.   Hormonal changes, including pregnancy and menstruation, cause fluid retention. This fluid may leak out of vessels and cause edema.   Medications that cause fluid retention. Examples are sex hormones, blood pressure medications, steroid treatment, or anti-depressants.   Some illnesses cause edema, especially heart failure, kidney disease, or liver disease.   Surgery that cuts veins or lymph nodes, such as surgery done for the heart or for breast cancer, may result in edema.  DIAGNOSIS   Your caregiver is usually easily able to determine what is causing your swelling (edema) by simply asking what is wrong (getting a history) and examining you (doing  a physical). Sometimes x-rays, EKG (electrocardiogram or heart tracing), and blood work may be done to evaluate for underlying medical illness.  TREATMENT   General treatment includes:   Leg elevation (or elevation of the affected body part).   Restriction of fluid intake.   Prevention of fluid overload.   Compression of the affected body part. Compression with elastic bandages or support stockings squeezes the tissues, preventing fluid from entering and forcing it back into the blood vessels.   Diuretics (also called water pills or fluid pills) pull fluid out of your body in the form of increased urination. These are effective in reducing the swelling, but can have side effects and must be used only under your caregiver's supervision. Diuretics are appropriate only for some types of edema.  The specific treatment can be directed at any underlying causes discovered. Heart, liver, or kidney disease should be treated appropriately.  HOME CARE INSTRUCTIONS    Elevate the legs (or affected body part) above the level of the heart, while lying down.   Avoid sitting or standing still for prolonged periods of time.   Avoid putting anything directly under the knees when lying down, and do not wear constricting clothing or garters on the upper legs.   Exercising the legs causes the fluid to work back into the veins and lymphatic channels. This may help the swelling go down.   The pressure applied by elastic bandages or support stockings can help reduce ankle swelling.     A low-salt diet may help reduce fluid retention and decrease the ankle swelling.   Take any medications exactly as prescribed.  SEEK MEDICAL CARE IF:   Your edema is not responding to recommended treatments.  SEEK IMMEDIATE MEDICAL CARE IF:    You develop shortness of breath or chest pain.   You cannot breathe when you lay down; or if, while lying down, you have to get up and go to the window to get your breath.   You are having increasing  swelling without relief from treatment.   You develop a fever over 102 F (38.9 C).   You develop pain or redness in the areas that are swollen.   Tell your caregiver right away if you have gained 3 lb/1.4 kg in 1 day or 5 lb/2.3 kg in a week.  MAKE SURE YOU:    Understand these instructions.   Will watch your condition.   Will get help right away if you are not doing well or get worse.  Document Released: 10/25/2005 Document Revised: 04/25/2012 Document Reviewed: 06/12/2008  [employer] Patient Information 2014 [employer].

## 2013-06-07 NOTE — Progress Notes (Signed)
[facility] [address] [place] [address] [phone]  Date:  06/07/2013   Name:  Anita Hodges   DOB:  Feb 19, 1986   MRN:  [id number]  PCP:  No PCP Per Patient    Chief Complaint: Sore Throat and Cough   History of Present Illness:  Anita Hodges is a 27 y.o. very pleasant female patient who presents with the following:  Ill since Sunday with a sore throat and tonsils swollen.  Says difficulty talking. Says that she has difficulty swallowing due to pain.  No fever or chills.  Occasional cough scant sputum.  Pain with coughing.  No improvement with over the counter medications or other home remedies.  Has swelling in both feet for months intermittently now worse.  denies other complaint or health concern today.   Patient Active Problem List   Diagnosis Date Noted  . Overweight 01/29/2013    Past Medical History  Diagnosis Date  . Obesity   . Hypertension     Past Surgical History  Procedure Laterality Date  . Wisdom tooth extraction      History  Substance Use Topics  . Smoking status: Never Smoker   . Smokeless tobacco: Never Used  . Alcohol Use: Yes     Comment: OCC    Family History  Problem Relation Age of Onset  . Hypertension Mother   . Diabetes Father   . Cancer Maternal Grandfather     ?  Marland Kitchen Cancer Paternal Grandmother     ?    No Known Allergies  Medication list has been reviewed and updated.  Current Outpatient Prescriptions on File Prior to Visit  Medication Sig Dispense Refill  . sertraline (ZOLOFT) 50 MG tablet Take 1 tablet (50 mg total) by mouth daily.  30 tablet  3  . medroxyPROGESTERone (PROVERA) 10 MG tablet Take 1 tablet (10 mg total) by mouth daily.  10 tablet  0  . norgestimate-ethinyl estradiol (ORTHO-CYCLEN,SPRINTEC,PREVIFEM) 0.25-35 MG-MCG tablet Take 1 tablet by mouth daily.  1 Package  11   No current facility-administered medications on file prior to visit.    Review of Systems:  As per HPI,  otherwise negative.    Physical Examination: Filed Vitals:   06/07/13 1841  BP: 124/84  Pulse: 90  Temp: 98.6 F (37 C)  Resp: 16   Filed Vitals:   06/07/13 1841  Height: 5\' 5"  (1.651 m)  Weight: 283 lb 9.6 oz (128.64 kg)   Body mass index is 47.19 kg/(m^2). Ideal Body Weight: Weight in (lb) to have BMI = 25: 149.9  GEN: WDWN, NAD, Non-toxic, A & O x 3 HEENT: Atraumatic, Normocephalic. Neck supple. No masses, anterior cervical LAD.  Tonsils enormous with exudate. Ears and Nose: No external deformity. CV: RRR, No M/G/R. No JVD. No thrill. No extra heart sounds. PULM: CTA B, no wheezes, crackles, rhonchi. No retractions. No resp. distress. No accessory muscle use. ABD: S, NT, ND, +BS. No rebound. No HSM. EXTR: No c/c.  +3 edema both lowers to proximal lower leg. No calf tenderness NEURO Normal gait.  PSYCH: Normally interactive. Conversant. Not depressed or anxious appearing.  Calm demeanor.    Assessment and Plan: Tonsillitis Pen v k Peripheral edema Labs HCTZ   Signed,  Phillips Odor, MD

## 2013-06-08 LAB — COMPREHENSIVE METABOLIC PANEL
ALT: 13 U/L (ref 0–35)
AST: 14 U/L (ref 0–37)
Albumin: 4.2 g/dL (ref 3.5–5.2)
Alkaline Phosphatase: 86 U/L (ref 39–117)
BUN: 11 mg/dL (ref 6–23)
CO2: 30 mEq/L (ref 19–32)
Calcium: 9.3 mg/dL (ref 8.4–10.5)
Chloride: 99 mEq/L (ref 96–112)
Creat: 0.82 mg/dL (ref 0.50–1.10)
Glucose, Bld: 89 mg/dL (ref 70–99)
Potassium: 4.3 mEq/L (ref 3.5–5.3)
Sodium: 135 mEq/L (ref 135–145)
Total Bilirubin: 0.4 mg/dL (ref 0.3–1.2)
Total Protein: 7.3 g/dL (ref 6.0–8.3)

## 2013-06-08 LAB — TSH: TSH: 2.738 u[IU]/mL (ref 0.350–4.500)

## 2013-07-10 ENCOUNTER — Telehealth: Payer: Self-pay

## 2013-07-10 NOTE — Telephone Encounter (Signed)
PT STATES HER THROAT IS STILL BOTHERING HER AND WOULD LIKE TO BE REFERRED TO AN ENT PLEASE CALL [phone]

## 2013-07-11 NOTE — Telephone Encounter (Signed)
Tell her a referral would be inappropriate at this point. She should come in and have a mono done.

## 2013-07-13 NOTE — Telephone Encounter (Signed)
Pt states that she has scheduled her own referral and disregard the previous request.

## 2013-09-13 ENCOUNTER — Other Ambulatory Visit: Payer: Self-pay

## 2014-01-12 ENCOUNTER — Ambulatory Visit (INDEPENDENT_AMBULATORY_CARE_PROVIDER_SITE_OTHER): Payer: BC Managed Care – PPO | Admitting: Family Medicine

## 2014-01-12 VITALS — BP 122/70 | HR 92 | Temp 98.2°F | Resp 18 | Ht 65.0 in | Wt 311.8 lb

## 2014-01-12 DIAGNOSIS — R600 Localized edema: Secondary | ICD-10-CM

## 2014-01-12 DIAGNOSIS — R41840 Attention and concentration deficit: Secondary | ICD-10-CM

## 2014-01-12 DIAGNOSIS — R635 Abnormal weight gain: Secondary | ICD-10-CM

## 2014-01-12 DIAGNOSIS — F988 Other specified behavioral and emotional disorders with onset usually occurring in childhood and adolescence: Secondary | ICD-10-CM

## 2014-01-12 LAB — POCT GLYCOSYLATED HEMOGLOBIN (HGB A1C): Hemoglobin A1C: 5

## 2014-01-12 NOTE — Progress Notes (Signed)
Chief Complaint:  Chief Complaint  Patient presents with  . Edema    x7 months, B- Leg, ankle, feet "painful"  . ADD evaluation    x3 months? worsening    HPI: Anita Hodges is a 28 y.o. female who is here for :  1. Bilateral leg edema, she is having more leg swelling and feet swelling for over 6 months, She used to be more active in her job but now she sits a lot for her job 8-5:30 , comutes 1 hour each way, and is not active and feels like the swelling has gotten worse. She denies having any heart or kidney problems. She has gained weight because she has not been as active in her job. She also does not eat a very healthy diet, likes greasy foods. She is here with her boyfriend who confirms this. She denies any thyroid issues. Chart review shows that she has gained about 28.6 lbs n the last 8 months, she saw a nutritionist in 02/2013 but never went back because did not feel it was helpful. The nutritionist asked her to increase her exercise and also to carb count. She wants to know if she can get a diet pill. She was given a  water pill for this HCTZ and that did not work. Sometimes her feet hurt because of the swelling.   2. She wants to know if she can get ADD medicine, her boyfriend told her she does not focus very well. 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 She is not focused at job and her boss sometimes ask here to do things and she would forget, in high school she had to doodle in order to not be distracted. No one in her family has ADD/ADHD   Past Medical History  Diagnosis Date  . Obesity   . Hypertension   . Allergy   . Anxiety    Past Surgical History  Procedure Laterality Date  . Wisdom tooth extraction    . Tonsillectomy     History   Social History  . Marital Status: Single    Spouse Name: N/A    Number of Children: N/A  . Years of Education: N/A   Social History Main Topics  .  Smoking status: Never Smoker   . Smokeless tobacco: Never Used  . Alcohol Use: No     Comment: OCC  . Drug Use: No  . Sexual Activity: Yes    Birth Control/ Protection: None, Pill   Other Topics Concern  . None   Social History Narrative  . None   Family History  Problem Relation Age of Onset  . Hypertension Mother   . Diabetes Father   . Cancer Maternal Grandfather     ?  Marland Kitchen Cancer Paternal Grandmother     ?   No Known Allergies Prior to Admission medications   Medication Sig Start Date End Date Taking? Authorizing Provider  hydrochlorothiazide (HYDRODIURIL) 25 MG tablet Take 1 tablet (25 mg total) by mouth daily. 06/07/13  Yes Ellison Carwin, MD  medroxyPROGESTERone (PROVERA) 10 MG tablet Take 1 tablet (10 mg total) by mouth daily. 01/31/13   Terrance Mass, MD  norgestimate-ethinyl estradiol (ORTHO-CYCLEN,SPRINTEC,PREVIFEM) 0.25-35 MG-MCG tablet Take 1 tablet by mouth daily. 01/02/13   Terrance Mass, MD  penicillin v potassium (VEETID) 500 MG tablet Take 1 tablet (500 mg total) by  mouth 4 (four) times daily. 06/07/13   Ellison Carwin, MD  sertraline (ZOLOFT) 50 MG tablet Take 1 tablet (50 mg total) by mouth daily. 03/12/13   Robyn Haber, MD     ROS: The patient denies fevers, chills, night sweats, unintentional weight loss, chest pain, palpitations, wheezing, dyspnea on exertion, nausea, vomiting, abdominal pain, dysuria, hematuria, melena, numbness, weakness, or tingling.   All other systems have been reviewed and were otherwise negative with the exception of those mentioned in the HPI and as above.    PHYSICAL EXAM: Filed Vitals:   01/12/14 1703  BP: 122/70  Pulse: 92  Temp: 98.2 F (36.8 C)  Resp: 18   Filed Vitals:   01/12/14 1703  Height: 5\' 5"  (1.651 m)  Weight: 311 lb 12.8 oz (141.432 kg)   Body mass index is 51.89 kg/(m^2).  General: Alert, no acute distress. Morbidly obese female HEENT:  Normocephalic, atraumatic, oropharynx patent. EOMI,  PERRLA Cardiovascular:  Regular rate and rhythm, no rubs murmurs or gallops.  No Carotid bruits, radial pulse intact. No pedal edema.  Respiratory: Clear to auscultation bilaterally.  No wheezes, rales, or rhonchi.  No cyanosis, no use of accessory musculature GI: No organomegaly, abdomen is soft and non-tender, positive bowel sounds.  No masses. Skin: No rashes. Neurologic: Facial musculature symmetric. Psychiatric: Patient is appropriate throughout our interaction until she became emotional and kept complaining about wasting her time waiting for [place] to eval her when she was not going to get a diet pill Lymphatic: No cervical lymphadenopathy Musculoskeletal: Gait intact.   LABS: Results for orders placed in visit on 01/12/14  TSH      Result Value Ref Range   TSH 2.720  0.350 - 4.500 uIU/mL  COMPREHENSIVE METABOLIC PANEL      Result Value Ref Range   Sodium 140  135 - 145 mEq/L   Potassium 4.0  3.5 - 5.3 mEq/L   Chloride 104  96 - 112 mEq/L   CO2 25  19 - 32 mEq/L   Glucose, Bld 74  70 - 99 mg/dL   BUN 10  6 - 23 mg/dL   Creat 0.66  0.50 - 1.10 mg/dL   Total Bilirubin 0.5  0.2 - 1.2 mg/dL   Alkaline Phosphatase 86  39 - 117 U/L   AST 16  0 - 37 U/L   ALT 18  0 - 35 U/L   Total Protein 7.1  6.0 - 8.3 g/dL   Albumin 4.1  3.5 - 5.2 g/dL   Calcium 9.1  8.4 - 10.5 mg/dL  LIPID PANEL      Result Value Ref Range   Cholesterol 132  0 - 200 mg/dL   Triglycerides 54  <150 mg/dL   HDL 36 (*) >39 mg/dL   Total CHOL/HDL Ratio 3.7     VLDL 11  0 - 40 mg/dL   LDL Cholesterol 85  0 - 99 mg/dL  POCT GLYCOSYLATED HEMOGLOBIN (HGB A1C)      Result Value Ref Range   Hemoglobin A1C 5.0       EKG/XRAY:   Primary read interpreted by Dr. Marin Comment at [facility].   ASSESSMENT/PLAN: Encounter Diagnoses  Name Primary?  . Edema, lower extremity Yes  . Weight gain   . Difficulty concentrating      28 year old morbidly obese female who is here for chronic bilateral lower extremity edema and feet for  the last 7 months , a  quick fix to her weight issues that  includes  a "diet pill " and also a pill to help her focus. She was complaining that she wasted her time waiting to be evaluated since I was not going to give her a diet pill, she did not understand why we could not prescribe her a diet pill when we do everything else here, it did not make sense even when an OB/GYn can rx her the pill. Since she did not seem to be happy with the fact that I was not going to prescribe her a diet pill, I explained that the risks outweigh the benefits in my opinion and I do not feel comfortable rx these meds and I do not feel that they are safe. I gave her some options while in the office - 1. Seeing another provider in our office 2. Getting referred to a bariatric clinic ie [facility] where they do nonsurgical options or   [facility] where they do rx diet pills 3. Seeing the nutritionist again 4. Leaving and not getting charged 4. Go see her ob/gyn who rx the phentermine as she claims  She asked me to see if another provider who would rx diet pills for her, I asked Drs Nyoka Cowden and Linna Darner and also Christell Faith PA-C who all stated that they did not. I then pursued to ask her if she wanted to just leave and we will not charge her for this visit, she declined. I think much of the swelling in her leg is the sedentary lifestyle she leads and her recent 28 lb weight gain in 8 months, I encouraged her to get a stationary bike or do water aerobics. Her boyfriend states that she does not like getting into a swimsuit.  We discussed what options we can do. I can get basic blood work to see if she has any major medical illnesses that is causing her leg swelling, weight gain including thyroid , kidney, metabolic issues. According to my assistant she was in tears after I left. IF her labs are abnormal then we will treat In the meantime I think after our discussion she may be willing to be referred to [facility] or [place] or [facility]. I will call her with labs and ask her what she wants to do  She would qualify since her BMI is 51 Additionally I will send her to get tested for ADD/ADHD Labs pending F/u prn   Gross sideeffects, risk and benefits, and alternatives of medications d/w patient. Patient is aware that all medications have potential sideeffects and we are unable to predict every sideeffect or drug-drug interaction that may occur.  Lachelle Rissler, [place], DO 01/14/2014 5:32 PM  Spoke to patient about normal labs. She will get back to me about where she wants to be referred to once she reviews the services offered online. I gave her the names of 3 choices: [facility], [facility], [facility] She will call back with her choice

## 2014-01-13 LAB — COMPREHENSIVE METABOLIC PANEL
ALT: 18 U/L (ref 0–35)
AST: 16 U/L (ref 0–37)
Albumin: 4.1 g/dL (ref 3.5–5.2)
Alkaline Phosphatase: 86 U/L (ref 39–117)
BUN: 10 mg/dL (ref 6–23)
CO2: 25 mEq/L (ref 19–32)
Calcium: 9.1 mg/dL (ref 8.4–10.5)
Chloride: 104 mEq/L (ref 96–112)
Creat: 0.66 mg/dL (ref 0.50–1.10)
Glucose, Bld: 74 mg/dL (ref 70–99)
Potassium: 4 mEq/L (ref 3.5–5.3)
Sodium: 140 mEq/L (ref 135–145)
Total Bilirubin: 0.5 mg/dL (ref 0.2–1.2)
Total Protein: 7.1 g/dL (ref 6.0–8.3)

## 2014-01-13 LAB — LIPID PANEL
Cholesterol: 132 mg/dL (ref 0–200)
HDL: 36 mg/dL — ABNORMAL LOW (ref >39)
LDL Cholesterol: 85 mg/dL (ref 0–99)
Total CHOL/HDL Ratio: 3.7 Ratio
Triglycerides: 54 mg/dL (ref <150)
VLDL: 11 mg/dL (ref 0–40)

## 2014-01-13 LAB — TSH: TSH: 2.72 u[IU]/mL (ref 0.350–4.500)

## 2014-01-15 ENCOUNTER — Telehealth: Payer: Self-pay

## 2014-01-15 NOTE — Telephone Encounter (Signed)
Dr. Marin Comment - please advise on changing pt's diuretic

## 2014-01-15 NOTE — Telephone Encounter (Signed)
Patient wants to know if there is a better water pill.   She is bloated.   [facility]   [phone]

## 2014-01-15 NOTE — Telephone Encounter (Signed)
Pt is already on HCTZ. Do you recommend an increase?

## 2014-01-16 NOTE — Telephone Encounter (Signed)
Pt is needing to talk with dr Marin Comment about the counseling referral the office at [facility] does not take patient insurance and would like to know where she suggest next   Best number [phone]

## 2014-01-16 NOTE — Telephone Encounter (Signed)
Dr. Marin Comment, Please advise.

## 2014-01-21 ENCOUNTER — Encounter: Payer: Self-pay | Admitting: Emergency Medicine

## 2014-01-21 ENCOUNTER — Telehealth: Payer: Self-pay | Admitting: *Deleted

## 2014-01-21 NOTE — Telephone Encounter (Signed)
Message copied by Doylene Canning on Mon Jan 21, 2014  8:47 AM ------      Message from: Rikki Spearing P      Created: Thu Jan 17, 2014 10:45 AM       Can you call her and give her a list of names and numbers of psychologists or psychiatrist who does ADD/ADHD testing in town, and who takes insurance. It is listed in the behavioral health section of our referrals booklet at the TL desk. Thanks TLE ------

## 2014-01-22 MED ORDER — FUROSEMIDE 20 MG PO TABS: ORAL_TABLET | ORAL | Status: DC

## 2014-01-22 NOTE — Telephone Encounter (Signed)
Dr. Marin Comment, Can you please review her chart and the emails? Pt has been experiencing the same edema since you saw her 01/12/14. She is questioning an alternative "water pill" from the one prescribed.

## 2014-01-22 NOTE — Telephone Encounter (Signed)
Advised pt of Dr. Gus Puma message. Pt is going to call her insurance company to find out where she can be sent for the ADD evaluation. Pt will call us with that information for a referral. Advised pt to RTC in 25 days for blood work and follow up. Pt understands.

## 2014-01-22 NOTE — Telephone Encounter (Signed)
Lm for rtn call to go over Dr. Gus Puma note.

## 2014-03-21 ENCOUNTER — Other Ambulatory Visit: Payer: Self-pay

## 2014-03-21 NOTE — Telephone Encounter (Signed)
Patient refused to come into the walk in center for her office visit. Stated she did not want to wait. She stated she was told to come in for an office visit in order to get a refill on Lasix 20 mg. She request for Dr. Joseph Art to be her primary care doctor. She request to set an appointment with him. I transferred her over to the appointment center to set an appointment with Dr. Joseph Art. Patient still request for an refill on Lasix 20 mg. [place], [place]

## 2014-03-21 NOTE — Telephone Encounter (Signed)
Pt has appt sch w/Dr L for 04/04/14. Dr Marin Comment, do you want to RF Lasix until then?

## 2014-03-25 MED ORDER — FUROSEMIDE 20 MG PO TABS: ORAL_TABLET | ORAL | Status: DC

## 2014-04-04 ENCOUNTER — Encounter: Payer: Self-pay | Admitting: Family Medicine

## 2014-04-04 ENCOUNTER — Ambulatory Visit (INDEPENDENT_AMBULATORY_CARE_PROVIDER_SITE_OTHER): Payer: BC Managed Care – PPO | Admitting: Family Medicine

## 2014-04-04 VITALS — BP 140/88 | HR 83 | Temp 98.3°F | Resp 16 | Ht 65.0 in | Wt 307.0 lb

## 2014-04-04 DIAGNOSIS — R600 Localized edema: Secondary | ICD-10-CM

## 2014-04-04 DIAGNOSIS — R609 Edema, unspecified: Secondary | ICD-10-CM

## 2014-04-04 MED ORDER — PHENTERMINE HCL 37.5 MG PO CAPS: 37.5000 mg | ORAL_CAPSULE | ORAL | Status: DC

## 2014-04-04 MED ORDER — FUROSEMIDE 20 MG PO TABS: ORAL_TABLET | ORAL | Status: DC

## 2014-04-04 NOTE — Progress Notes (Signed)
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 She comes in because of concerns about her weight. She's very unhappy about her weight gain which she attributes to recent stress. It began with a tonsillectomy last September and now with problems at work as well she is been stressed and gained 30 pounds.  Patient cries openly as she describes it as dull she feels about the weight gain. She says she has some hip pain which prevents her from walking much. She's embarrassed to get into pool.  Patient has been working in [place] and has an hour commute to and from [place], [place]. This leaves her very little time for any other activities and she often eats after 8:00 at night.  Patient continues to have swelling in her feet but says that the Lasix does help some.  Objective: Patient is cheerful alert and articulate Results for orders placed in visit on 01/12/14  TSH      Result Value Ref Range   TSH 2.720  0.350 - 4.500 uIU/mL  COMPREHENSIVE METABOLIC PANEL      Result Value Ref Range   Sodium 140  135 - 145 mEq/L   Potassium 4.0  3.5 - 5.3 mEq/L   Chloride 104  96 - 112 mEq/L   CO2 25  19 - 32 mEq/L   Glucose, Bld 74  70 - 99 mg/dL   BUN 10  6 - 23 mg/dL   Creat 0.66  0.50 - 1.10 mg/dL   Total Bilirubin 0.5  0.2 - 1.2 mg/dL   Alkaline Phosphatase 86  39 - 117 U/L   AST 16  0 - 37 U/L   ALT 18  0 - 35 U/L   Total Protein 7.1  6.0 - 8.3 g/dL   Albumin 4.1  3.5 - 5.2 g/dL   Calcium 9.1  8.4 - 10.5 mg/dL  LIPID PANEL      Result Value Ref Range   Cholesterol 132  0 - 200 mg/dL   Triglycerides 54  <150 mg/dL   HDL 36 (*) >39 mg/dL   Total CHOL/HDL Ratio 3.7     VLDL 11  0 - 40 mg/dL   LDL Cholesterol 85  0 - 99 mg/dL  POCT GLYCOSYLATED HEMOGLOBIN (HGB A1C)      Result Value Ref Range   Hemoglobin A1C 5.0     We spent 25 minutes face-to-face discussing the weight problem.  Plan: Will give patient one month of phentermine to significant jump start the weight loss  process. I suggested she get some counseling regarding her poor self-image. In addition I suggested she try going to one of the local pools or people don't or and where other people are just as heavy.  Followup one month  Signed, Carola Frost.D.

## 2014-04-23 ENCOUNTER — Other Ambulatory Visit: Payer: Self-pay

## 2014-04-23 DIAGNOSIS — R600 Localized edema: Secondary | ICD-10-CM

## 2014-04-23 NOTE — Telephone Encounter (Signed)
Pt cancelled appt b/c she states she lost her rx for weight loss from Dr L and the appt was based on weight loss. Asks for rx. Will call back to reschedule based on taking rx.  Best: [phone] Pharmacy: [facility] [place] [facility]

## 2014-04-24 NOTE — Telephone Encounter (Signed)
Dr L, OK to RF pt's phentermine?

## 2014-04-25 ENCOUNTER — Ambulatory Visit: Payer: BC Managed Care – PPO | Admitting: Family Medicine

## 2014-04-25 NOTE — Telephone Encounter (Signed)
LMOM that pt needs to RTC bf any RFs.

## 2014-06-03 ENCOUNTER — Telehealth: Payer: Self-pay

## 2014-06-03 ENCOUNTER — Telehealth: Payer: Self-pay | Admitting: Cardiovascular Disease

## 2014-06-03 NOTE — Telephone Encounter (Signed)
Pt would like to have a referral to a cardiologist, she states that she has an enlarged heart and has been having trouble with swelling even while on the water pill prescribed to her by Dr.Lauenstein. She states that there is a new cardiologist in [place] that she would like to see, however she does not know the name of the practice at the moment, she states that it is apart of [facility]. Best# [phone]

## 2014-06-03 NOTE — Telephone Encounter (Signed)
Please use same protocol as before for new patient appointments.

## 2014-06-03 NOTE — Telephone Encounter (Signed)
Anita Hodges called today stating that she would like a 2nd opinion of why she continues to have swelling in her feet, legs and ankles. She has never seen our doctors but was told that She might to get a Cardiology work up. No other symptoms noted per patient. She works across the street from our office. She requested to see [facility] vs other practices Outside the [facility] system.

## 2014-06-04 NOTE — Telephone Encounter (Signed)
Left detailed message on machine for pt to rtc for eval as we have not seen her for this problem recently, and this is not is her problem list.

## 2014-06-05 ENCOUNTER — Ambulatory Visit (INDEPENDENT_AMBULATORY_CARE_PROVIDER_SITE_OTHER): Payer: BC Managed Care – PPO | Admitting: Family Medicine

## 2014-06-05 ENCOUNTER — Ambulatory Visit (INDEPENDENT_AMBULATORY_CARE_PROVIDER_SITE_OTHER): Payer: BC Managed Care – PPO

## 2014-06-05 VITALS — BP 128/80 | HR 89 | Temp 98.3°F | Resp 18 | Ht 65.0 in | Wt 321.0 lb

## 2014-06-05 DIAGNOSIS — J019 Acute sinusitis, unspecified: Secondary | ICD-10-CM

## 2014-06-05 DIAGNOSIS — I517 Cardiomegaly: Secondary | ICD-10-CM

## 2014-06-05 DIAGNOSIS — R079 Chest pain, unspecified: Secondary | ICD-10-CM

## 2014-06-05 DIAGNOSIS — R609 Edema, unspecified: Secondary | ICD-10-CM

## 2014-06-05 DIAGNOSIS — N926 Irregular menstruation, unspecified: Secondary | ICD-10-CM

## 2014-06-05 DIAGNOSIS — R600 Localized edema: Secondary | ICD-10-CM

## 2014-06-05 DIAGNOSIS — J3081 Allergic rhinitis due to animal (cat) (dog) hair and dander: Secondary | ICD-10-CM

## 2014-06-05 MED ORDER — FUROSEMIDE 20 MG PO TABS: ORAL_TABLET | ORAL | Status: DC

## 2014-06-05 MED ORDER — MONTELUKAST SODIUM 10 MG PO TABS: 10.0000 mg | ORAL_TABLET | Freq: Every day | ORAL | Status: DC

## 2014-06-05 MED ORDER — AMOXICILLIN 875 MG PO TABS: 875.0000 mg | ORAL_TABLET | Freq: Two times a day (BID) | ORAL | Status: DC

## 2014-06-05 NOTE — Progress Notes (Addendum)
Subjective:    Patient ID: Anita Hodges, female    DOB: 12-03-1985, 28 y.o.   MRN: [id number]  Sinus Problem Associated symptoms include congestion and sinus pressure. Pertinent negatives include no chills or coughing.  Chest Pain  Pertinent negatives include no cough or fever.   Chief Complaint  Patient presents with  . Edema    in feet and legs over a year   . Sinus Problem    sneezing mostly has been going on for while now   . Chest Pain    comes and goes x3 weeks or less   . rx refills    lasix but states medication does not seem to be working    This chart was scribed for Robyn Haber, MD by Thea Alken, ED Scribe. This patient was seen in room 12 and the patient's care was started at 7:21 PM.  HPI Comments: Anita Hodges is a 28 y.o. female who presents to the [facility] with multiple complaints.   1) pt is c/o worsening bilateral foot swelling that began 1 year ago. Pt reports taking lasix without relief. She reports this medication causes frequent urination. Pt reports she only took phentermine 2 days before losing the medication. Pt reports spotting and irregular periods within the last year or 2. Pt has a gynecologist but has not been seen since last year. Pt denies constant standing.   2) pt also complains of constant sinus pressure with associated congestion. Pt has pressure behind bilateral eyes. Pt reports that she has 2 dogs that sleep with her during the night. Pt denies cough, fever and chills.  3) pt has sporadic, spontaneous, sharp CP that she's had for a few weeks. Pt reports possible anxiety.  Patient works as a Secretary/administrator.  Past Medical History  Diagnosis Date  . Obesity   . Hypertension   . Allergy   . Anxiety    Past Surgical History  Procedure Laterality Date  . Wisdom tooth extraction    . Tonsillectomy     Prior to Admission medications   Medication Sig Start Date End Date Taking? Authorizing Provider    furosemide (LASIX) 20 MG tablet Take 1 pill PO daily prn for leg swelling. 04/04/14  Yes Robyn Haber, MD  phentermine 37.5 MG capsule Take 1 capsule (37.5 mg total) by mouth every morning. 04/04/14   Robyn Haber, MD   Review of Systems  Constitutional: Negative for fever and chills.  HENT: Positive for congestion and sinus pressure.   Respiratory: Positive for wheezing. Negative for cough.   Cardiovascular: Positive for chest pain.  Genitourinary: Positive for menstrual problem.   Objective:   Physical Exam Filed Vitals:   06/05/14 1912  BP: 128/80  Pulse: 89  Temp: 98.3 F (36.8 C)  TempSrc: Oral  Resp: 18  Height: 5\' 5"  (1.651 m)  Weight: 321 lb (145.605 kg)  SpO2: 98%   Filed Weights   06/05/14 1912  Weight: 321 lb (145.605 kg)   patient appears to be a morbidly obese 28 year old woman appearing stated age in no acute distress HEENT: Unremarkable Neck: Supple no adenopathy or thyromegaly Chest: Clear Heart: Regular no murmur Extremities: Diffuse nonpitting edema bilaterally with good pedal pulses and tenderness in both feet. Color is normal with no rashes EKG:  NSR [facility] reading (PRIMARY) by  Dr. Joseph Art:  CXR->.poor inspiration, suggestive of cardiomegaly   Assessment & Plan:    Left sided chest pain - Plan: EKG 12-Lead,  DG Chest 2 View, Ambulatory referral to Cardiology  Bilateral edema of lower extremity - Plan: Ambulatory referral to Endocrinology  Irregular periods  Morbid obesity - Plan: furosemide (LASIX) 20 MG tablet, DISCONTINUED: furosemide (LASIX) 20 MG tablet  Pedal edema - Plan: furosemide (LASIX) 20 MG tablet, DISCONTINUED: furosemide (LASIX) 20 MG tablet  Cardiomegaly - Plan: Ambulatory referral to Cardiology  Acute sinusitis, recurrence not specified, unspecified location - Plan: amoxicillin (AMOXIL) 875 MG tablet  Allergic rhinitis due to animal hair and dander - Plan: montelukast (SINGULAIR) 10 MG tablet  Signed, Robyn Haber,  MD

## 2014-06-05 NOTE — Telephone Encounter (Signed)
lmom for pt to cb

## 2014-06-07 ENCOUNTER — Ambulatory Visit: Payer: BC Managed Care – PPO | Admitting: Cardiovascular Disease

## 2014-06-11 ENCOUNTER — Ambulatory Visit (INDEPENDENT_AMBULATORY_CARE_PROVIDER_SITE_OTHER): Payer: BC Managed Care – PPO | Admitting: Cardiology

## 2014-06-11 VITALS — BP 136/86 | HR 85 | Wt 320.0 lb

## 2014-06-11 DIAGNOSIS — R0989 Other specified symptoms and signs involving the circulatory and respiratory systems: Secondary | ICD-10-CM

## 2014-06-11 DIAGNOSIS — R0609 Other forms of dyspnea: Secondary | ICD-10-CM

## 2014-06-11 DIAGNOSIS — R609 Edema, unspecified: Secondary | ICD-10-CM

## 2014-06-11 DIAGNOSIS — R0600 Dyspnea, unspecified: Secondary | ICD-10-CM

## 2014-06-11 DIAGNOSIS — R0789 Other chest pain: Secondary | ICD-10-CM

## 2014-06-11 DIAGNOSIS — R600 Localized edema: Secondary | ICD-10-CM

## 2014-06-11 MED ORDER — FUROSEMIDE 40 MG PO TABS: 40.0000 mg | ORAL_TABLET | Freq: Every day | ORAL | Status: DC

## 2014-06-11 NOTE — Progress Notes (Signed)
Clinical Summary Anita Hodges is a 28 y.o.female seen today as a new patient for the following medical conditions.  1. LE edema - increased LE x 1 year. Increased SOB and DOE over that time. DOE at short distances, though also limited by chronic ankle pain. - no orthopnea, no PND - reports recent significant weight gain - 01/2014 labs: TSH 2.72, Cr 0.66, BUN 10 06/05/14 CXR: no acute findings. EKG NSR no ischemic changes - started on lasix previously with originally some improvement, but swelling has once again progressed  2. Chest pain - started a few weeks ago. Sharp shooting pain left chest, 8/10. No other associated symptoms. Worst with deep breath. Pain lasted for just a few minutes. Typically brought on by stress. Last episode yesterday during stress at work.   CAD risk factors: None  Past Medical History  Diagnosis Date  . Obesity   . Hypertension   . Allergy   . Anxiety      Allergies  Allergen Reactions  . Codeine Other (See Comments)    DIZZINESS AND COLD (SICK)     Current Outpatient Prescriptions  Medication Sig Dispense Refill  . amoxicillin (AMOXIL) 875 MG tablet Take 1 tablet (875 mg total) by mouth 2 (two) times daily.  20 tablet  0  . furosemide (LASIX) 20 MG tablet Take 1 pill PO daily prn for leg swelling.  30 tablet  5  . montelukast (SINGULAIR) 10 MG tablet Take 1 tablet (10 mg total) by mouth at bedtime.  30 tablet  3   No current facility-administered medications for this visit.     Past Surgical History  Procedure Laterality Date  . Wisdom tooth extraction    . Tonsillectomy       Allergies  Allergen Reactions  . Codeine Other (See Comments)    DIZZINESS AND COLD (SICK)      Family History  Problem Relation Age of Onset  . Hypertension Mother   . Diabetes Father   . Cancer Maternal Grandfather     ?  Marland Kitchen Cancer Paternal Grandmother     ?     Social History Anita Hodges reports that she has never smoked. She has never used  smokeless tobacco. Anita Hodges reports that she does not drink alcohol.   Review of Systems CONSTITUTIONAL: No weight loss, fever, chills, weakness or fatigue.  HEENT: Eyes: No visual loss, blurred vision, double vision or yellow sclerae.No hearing loss, sneezing, congestion, runny nose or sore throat.  SKIN: No rash or itching.  CARDIOVASCULAR: per HPI RESPIRATORY: No shortness of breath, cough or sputum.  GASTROINTESTINAL: No anorexia, nausea, vomiting or diarrhea. No abdominal pain or blood.  GENITOURINARY: No burning on urination, no polyuria NEUROLOGICAL: No headache, dizziness, syncope, paralysis, ataxia, numbness or tingling in the extremities. No change in bowel or bladder control.  MUSCULOSKELETAL: Leg swelling LYMPHATICS: No enlarged nodes. No history of splenectomy.  PSYCHIATRIC: No history of depression or anxiety.  ENDOCRINOLOGIC: No reports of sweating, cold or heat intolerance. No polyuria or polydipsia.  Marland Kitchen   Physical Examination p 85 bp 136/86 Wt 320 lbs Gen: resting comfortably, no acute distress HEENT: no scleral icterus, pupils equal round and reactive, no palptable cervical adenopathy,  CV: RRR, no m/r/g, no JVD, no carotid bruits Resp: Clear to auscultation bilaterally GI: abdomen is soft, non-tender, non-distended, normal bowel sounds, no hepatosplenomegaly MSK: extremities are warm, no edema.  Skin: warm, no rash Neuro:  no focal deficits Psych: appropriate affect  Diagnostic Studies  EKG 06/06/14: NSR, no ischemic changes   Assessment and Plan   1. Lower extremity edema - unclear cause. Could be potential cardiac component given her worsening DOE over the time period. Also could be related to her obesity - in setting of progressive DOE and LE edema, will obtain echo. Due to her body habitus will order with contrast - increase lasix to 40mg  daily  2. Chest pain - atypical, mainly associated with stress. She has no CAD risk factors - follow up echo  results.      Arnoldo Lenis, M.D., F.A.C.C.

## 2014-06-11 NOTE — Patient Instructions (Addendum)
Your physician has requested that you have an echocardiogram with contrast. Echocardiography is a painless test that uses sound waves to create images of your heart. It provides your doctor with information about the size and shape of your heart and how well your heart's chambers and valves are working. This procedure takes approximately one hour. There are no restrictions for this procedure. Office will contact with results via phone or letter.   Increase Lasix to 40mg  daily - new sent to pharm Lab for BMET  Continue all other medications.   Follow up pending test results

## 2014-06-12 DIAGNOSIS — R079 Chest pain, unspecified: Secondary | ICD-10-CM | POA: Insufficient documentation

## 2014-06-12 DIAGNOSIS — R600 Localized edema: Secondary | ICD-10-CM | POA: Insufficient documentation

## 2014-06-18 ENCOUNTER — Ambulatory Visit (HOSPITAL_COMMUNITY)
Admission: RE | Admit: 2014-06-18 | Discharge: 2014-06-18 | Disposition: A | Discharge status: Home / Self Care | Payer: BC Managed Care – PPO | Source: Ambulatory Visit | Attending: Cardiology | Admitting: Cardiology

## 2014-06-18 DIAGNOSIS — R0989 Other specified symptoms and signs involving the circulatory and respiratory systems: Secondary | ICD-10-CM | POA: Insufficient documentation

## 2014-06-18 DIAGNOSIS — I10 Essential (primary) hypertension: Secondary | ICD-10-CM | POA: Insufficient documentation

## 2014-06-18 DIAGNOSIS — R0600 Dyspnea, unspecified: Secondary | ICD-10-CM

## 2014-06-18 DIAGNOSIS — R609 Edema, unspecified: Secondary | ICD-10-CM | POA: Insufficient documentation

## 2014-06-18 DIAGNOSIS — R0609 Other forms of dyspnea: Secondary | ICD-10-CM | POA: Insufficient documentation

## 2014-06-18 NOTE — Progress Notes (Signed)
  Echocardiogram 2D Echocardiogram has been performed.  Sig[place], [place] 06/18/2014, 9:02 AM

## 2014-06-20 ENCOUNTER — Telehealth: Payer: Self-pay | Admitting: *Deleted

## 2014-06-20 NOTE — Telephone Encounter (Signed)
Patient informed of results. Pt does not feel the need to schedule a follow up at this time.

## 2014-06-20 NOTE — Telephone Encounter (Signed)
Left message to return call 

## 2014-06-20 NOTE — Telephone Encounter (Signed)
Message copied by Orion Modest on Thu Jun 20, 2014 10:31 AM ------      Message from: [place] F      Created: Thu Jun 20, 2014 10:28 AM       Her echo was normal. Her heart function is strong and normal, does not appear to be causing her swelling or her chest pain. She needs to follow up with her primary for consideration of other causes. Can follow up with [place] in 6 months.            Zandra Abts MD ------

## 2014-06-20 NOTE — Telephone Encounter (Signed)
Message copied by Orion Modest on Thu Jun 20, 2014  1:07 PM ------      Message from: [place] F      Created: Thu Jun 20, 2014 10:28 AM       Her echo was normal. Her heart function is strong and normal, does not appear to be causing her swelling or her chest pain. She needs to follow up with her primary for consideration of other causes. Can follow up with [place] in 6 months.            Zandra Abts MD ------

## 2014-07-10 ENCOUNTER — Ambulatory Visit: Payer: BC Managed Care – PPO | Admitting: Cardiology

## 2014-08-06 ENCOUNTER — Encounter: Payer: Self-pay | Admitting: Cardiology

## 2014-12-09 ENCOUNTER — Encounter: Payer: Self-pay | Admitting: Surgery

## 2014-12-09 ENCOUNTER — Encounter (HOSPITAL_COMMUNITY): Payer: Self-pay

## 2014-12-12 ENCOUNTER — Other Ambulatory Visit: Payer: Self-pay | Admitting: *Deleted

## 2014-12-12 DIAGNOSIS — I83893 Varicose veins of bilateral lower extremities with other complications: Secondary | ICD-10-CM

## 2014-12-13 ENCOUNTER — Encounter: Payer: Self-pay | Admitting: Surgery

## 2014-12-16 ENCOUNTER — Encounter: Payer: Self-pay | Admitting: Surgery

## 2014-12-16 ENCOUNTER — Ambulatory Visit (INDEPENDENT_AMBULATORY_CARE_PROVIDER_SITE_OTHER): Payer: BLUE CROSS/BLUE SHIELD | Admitting: Surgery

## 2014-12-16 ENCOUNTER — Ambulatory Visit (HOSPITAL_COMMUNITY)
Admission: RE | Admit: 2014-12-16 | Discharge: 2014-12-16 | Disposition: A | Discharge status: Home / Self Care | Payer: BLUE CROSS/BLUE SHIELD | Source: Ambulatory Visit | Attending: Surgery | Admitting: Surgery

## 2014-12-16 VITALS — BP 136/91 | HR 82 | Temp 98.4°F | Resp 24 | Ht 65.0 in | Wt 338.0 lb

## 2014-12-16 DIAGNOSIS — I890 Lymphedema, not elsewhere classified: Secondary | ICD-10-CM

## 2014-12-16 DIAGNOSIS — I83893 Varicose veins of bilateral lower extremities with other complications: Secondary | ICD-10-CM | POA: Diagnosis present

## 2014-12-16 NOTE — Progress Notes (Signed)
Patient name: Anita Hodges MRN: [id number] DOB: [birth year] Sex: female   Referred by: Self  Reason for referral:  Chief Complaint  Patient presents with  . New Evaluation    BLE pain, heaviness increasing over the past 18 months. No injury.       HISTORY OF PRESENT ILLNESS: This is a very pleasant 29 year old female who comes in today for evaluation of leg swelling.  She states this has been going on for approximately 1.5 years and has been getting worse.  Both legs are affected equally.  She complains of pain in her thighs as well as in the back of her legs.  She has been evaluated from a cardiac standpoint and told that this workup was normal.  She has also attempted the use of fluid pills which did not help her swelling.  Her swelling has limited her activity level and hence she has begun increasing weight.  She denies any open wounds.  Past Medical History  Diagnosis Date  . Obesity   . Hypertension   . Allergy   . Anxiety     Past Surgical History  Procedure Laterality Date  . Wisdom tooth extraction    . Tonsillectomy      History   Social History  . Marital Status: Single    Spouse Name: N/A    Number of Children: N/A  . Years of Education: N/A   Occupational History  . Not on file.   Social History Main Topics  . Smoking status: Never Smoker   . Smokeless tobacco: Never Used  . Alcohol Use: No     Comment: OCC  . Drug Use: No  . Sexual Activity: Yes    Birth Control/ Protection: None, Pill   Other Topics Concern  . Not on file   Social History Narrative    Family History  Problem Relation Age of Onset  . Hypertension Mother   . Diabetes Father   . Cancer Maternal Grandfather     ?  Marland Kitchen Cancer Paternal Grandmother     ?    Allergies as of 12/16/2014 - Review Complete 12/16/2014  Allergen Reaction Noted  . Codeine Other (See Comments) 04/04/2014    Current Outpatient Prescriptions on File Prior to Visit  Medication Sig Dispense  Refill  . montelukast (SINGULAIR) 10 MG tablet Take 1 tablet (10 mg total) by mouth at bedtime. 30 tablet 3  . furosemide (LASIX) 40 MG tablet Take 1 tablet (40 mg total) by mouth daily. (Patient not taking: Reported on 12/16/2014) 30 tablet 6   No current facility-administered medications on file prior to visit.     REVIEW OF SYSTEMS: Cardiovascular: No chest pain, chest pressure, palpitations, orthopnea, or dyspnea on exertion. Positive for pain in legs when walking and swelling. Pulmonary: No productive cough, asthma or wheezing. Neurologic: positive for leg weakness. No dizziness. Hematologic: No bleeding problems or clotting disorders. Musculoskeletal: No joint pain or joint swelling. Gastrointestinal: No blood in stool or hematemesis Genitourinary: No dysuria or hematuria. Psychiatric:: No history of major depression. Integumentary: No rashes or ulcers. Constitutional: No fever or chills.  PHYSICAL EXAMINATION: General: The patient appears their stated age.  Vital signs are BP 136/91 mmHg  Pulse 82  Temp(Src) 98.4 F (36.9 C) (Oral)  Resp 24  Ht 5\' 5"  (1.651 m)  Wt 338 lb (153.316 kg)  BMI 56.25 kg/m2  SpO2 100%  LMP 12/15/2014 (Exact Date) HEENT:  No gross abnormalities Pulmonary: Respirations are non-labored Abdomen:  Soft and non-tender  Musculoskeletal: There are no major deformities.   Neurologic: No focal weakness or paresthesias are detected, Skin: There are no ulcer or rashes noted. Psychiatric: The patient has normal affect. Cardiovascular: There is a regular rate and rhythm without significant murmur appreciated.nonpitting lower extremity edema.  Palpable pedal pulses  Diagnostic Studies: Venous reflux testing was performed today.  Reflux is identified within the right common femoral vein and right saphenofemoral junction as well as the left common femoral vein   Assessment:  lymphedema Plan: Venous testing today did not reveal venous reflux that would  explain her symptoms.  I suspect that she is suffering from lymphedema.  I discussed the treatment strategies for this.  She is getting fitted for compression stockings.  I encouraged leg elevation as well as referral to a lymphedema therapist.  She will contact me on an as-needed basis.     Eldridge Abrahams, M.D. [facility] Office: [phone] Pager:  [phone]

## 2015-01-17 ENCOUNTER — Telehealth: Payer: Self-pay | Admitting: *Deleted

## 2015-01-17 ENCOUNTER — Encounter: Payer: Self-pay | Admitting: Gynecology

## 2015-01-17 NOTE — Telephone Encounter (Signed)
Pt was called her annual exam was rescheduled last minute. When apts called her she stated she was having some residual UTI sx's from being treated last weekend and through early part of week. We discussed her symptoms and both felt she was ok to wait unitl Monday morning apt. She will call the dr on call if needs something before then. KW CMA

## 2015-01-20 ENCOUNTER — Other Ambulatory Visit (HOSPITAL_COMMUNITY)
Admission: RE | Admit: 2015-01-20 | Discharge: 2015-01-20 | Disposition: A | Discharge status: Home / Self Care | Payer: BLUE CROSS/BLUE SHIELD | Source: Ambulatory Visit | Attending: Gynecology | Admitting: Gynecology

## 2015-01-20 ENCOUNTER — Ambulatory Visit (INDEPENDENT_AMBULATORY_CARE_PROVIDER_SITE_OTHER): Payer: BLUE CROSS/BLUE SHIELD | Admitting: Gynecology

## 2015-01-20 ENCOUNTER — Other Ambulatory Visit: Payer: Self-pay | Admitting: Gynecology

## 2015-01-20 ENCOUNTER — Encounter: Payer: Self-pay | Admitting: Gynecology

## 2015-01-20 VITALS — BP 130/86 | Ht 65.0 in | Wt 346.0 lb

## 2015-01-20 DIAGNOSIS — N938 Other specified abnormal uterine and vaginal bleeding: Secondary | ICD-10-CM | POA: Diagnosis not present

## 2015-01-20 DIAGNOSIS — R635 Abnormal weight gain: Secondary | ICD-10-CM | POA: Diagnosis not present

## 2015-01-20 DIAGNOSIS — Z113 Encounter for screening for infections with a predominantly sexual mode of transmission: Secondary | ICD-10-CM

## 2015-01-20 DIAGNOSIS — Z01419 Encounter for gynecological examination (general) (routine) without abnormal findings: Secondary | ICD-10-CM | POA: Diagnosis not present

## 2015-01-20 DIAGNOSIS — N920 Excessive and frequent menstruation with regular cycle: Secondary | ICD-10-CM

## 2015-01-20 DIAGNOSIS — N939 Abnormal uterine and vaginal bleeding, unspecified: Secondary | ICD-10-CM

## 2015-01-20 LAB — COMPREHENSIVE METABOLIC PANEL
ALT: 17 U/L (ref 0–35)
AST: 15 U/L (ref 0–37)
Albumin: 3.8 g/dL (ref 3.5–5.2)
Alkaline Phosphatase: 91 U/L (ref 39–117)
BUN: 9 mg/dL (ref 6–23)
CO2: 25 mEq/L (ref 19–32)
Calcium: 9 mg/dL (ref 8.4–10.5)
Chloride: 103 mEq/L (ref 96–112)
Creat: 0.63 mg/dL (ref 0.50–1.10)
Glucose, Bld: 95 mg/dL (ref 70–99)
Potassium: 4.6 mEq/L (ref 3.5–5.3)
Sodium: 139 mEq/L (ref 135–145)
Total Bilirubin: 0.3 mg/dL (ref 0.2–1.2)
Total Protein: 6.3 g/dL (ref 6.0–8.3)

## 2015-01-20 LAB — CBC WITH DIFFERENTIAL/PLATELET
Basophils Absolute: 0 10*3/uL (ref 0.0–0.1)
Basophils Relative: 0 % (ref 0–1)
Eosinophils Absolute: 0.1 10*3/uL (ref 0.0–0.7)
Eosinophils Relative: 1 % (ref 0–5)
HCT: 38.4 % (ref 36.0–46.0)
Hemoglobin: 12.6 g/dL (ref 12.0–15.0)
Lymphocytes Relative: 26 % (ref 12–46)
Lymphs Abs: 2.7 10*3/uL (ref 0.7–4.0)
MCH: 26.6 pg (ref 26.0–34.0)
MCHC: 32.8 g/dL (ref 30.0–36.0)
MCV: 81.2 fL (ref 78.0–100.0)
MPV: 8.9 fL (ref 8.6–12.4)
Monocytes Absolute: 0.5 10*3/uL (ref 0.1–1.0)
Monocytes Relative: 5 % (ref 3–12)
Neutro Abs: 7.1 10*3/uL (ref 1.7–7.7)
Neutrophils Relative %: 68 % (ref 43–77)
Platelets: 383 10*3/uL (ref 150–400)
RBC: 4.73 MIL/uL (ref 3.87–5.11)
RDW: 14.6 % (ref 11.5–15.5)
WBC: 10.4 10*3/uL (ref 4.0–10.5)

## 2015-01-20 LAB — HEPATITIS C ANTIBODY: HCV Ab: NEGATIVE

## 2015-01-20 LAB — LIPID PANEL
Cholesterol: 138 mg/dL (ref 0–200)
HDL: 38 mg/dL — ABNORMAL LOW (ref >=46)
LDL Cholesterol: 89 mg/dL (ref 0–99)
Total CHOL/HDL Ratio: 3.6 Ratio
Triglycerides: 54 mg/dL (ref <150)
VLDL: 11 mg/dL (ref 0–40)

## 2015-01-20 LAB — RPR

## 2015-01-20 LAB — TSH: TSH: 6.304 u[IU]/mL — ABNORMAL HIGH (ref 0.350–4.500)

## 2015-01-20 LAB — HEPATITIS B SURFACE ANTIGEN: Hepatitis B Surface Ag: NEGATIVE

## 2015-01-20 NOTE — Progress Notes (Signed)
Anita Hodges [birth year] [id number]   History:    29 y.o.  for annual gyn exam who has not been seen in the office in over 2 years. Patient reports irregular cycle she is very infrequently having intercourse and not using form of contraception. Several years ago she was started oral contraceptive pill to regulate her cycles. She states her cycles will start for lasting for 3-4 days stop for 2 days and then very heavy for 3 or 4 days afterwards sometimes she may have 2 periods per month. She denies any nipple discharge. No unusual headaches and no visual disturbances. Patient's father is prediabetic and her mother has history of hypertension. Patient declined flu vaccine. Patient with no past history of any abnormal Pap smears. She has had a new partner since last time she was in the office   Past medical history,surgical history, family history and social history were all reviewed and documented in the EPIC chart.  Gynecologic History Patient's last menstrual period was 12/23/2014. Contraception: none Last Pap: 2014. Results were: normal Last mammogram: Not indicated. Results were: Not indicated  Obstetric History OB History  Gravida Para Term Preterm AB SAB TAB Ectopic Multiple Living  0 0 0 0 0 0 0 0 0 0          ROS: A ROS was performed and pertinent positives and negatives are included in the history.  GENERAL: No fevers or chills. HEENT: No change in vision, no earache, sore throat or sinus congestion. NECK: No pain or stiffness. CARDIOVASCULAR: No chest pain or pressure. No palpitations. PULMONARY: No shortness of breath, cough or wheeze. GASTROINTESTINAL: No abdominal pain, nausea, vomiting or diarrhea, melena or bright red blood per rectum. GENITOURINARY: No urinary frequency, urgency, hesitancy or dysuria. MUSCULOSKELETAL: No joint or muscle pain, no back pain, no recent trauma. DERMATOLOGIC: No rash, no itching, no lesions. ENDOCRINE: No polyuria, polydipsia, no heat or cold  intolerance. No recent change in weight. HEMATOLOGICAL: No anemia or easy bruising or bleeding. NEUROLOGIC: No headache, seizures, numbness, tingling or weakness. PSYCHIATRIC: No depression, no loss of interest in normal activity or change in sleep pattern.     Exam: chaperone present  BP 130/86 mmHg  Ht 5\' 5"  (1.651 m)  Wt 346 lb (156.945 kg)  BMI 57.58 kg/m2  LMP 12/23/2014  Body mass index is 57.58 kg/(m^2).  General appearance : Well developed well nourished female. No acute distress HEENT: Eyes: no retinal hemorrhage or exudates,  Neck supple, trachea midline, no carotid bruits, no thyroidmegaly Lungs: Clear to auscultation, no rhonchi or wheezes, or rib retractions  Heart: Regular rate and rhythm, no murmurs or gallops Breast:Examined in sitting and supine position were symmetrical in appearance, no palpable masses or tenderness,  no skin retraction, no nipple inversion, no nipple discharge, no skin discoloration, no axillary or supraclavicular lymphadenopathy Abdomen: no palpable masses or tenderness, no rebound or guarding Extremities: no edema or skin discoloration or tenderness  Pelvic:  Bartholin, Urethra, Skene Glands: Within normal limits             Vagina: No gross lesions or discharge  Cervix: No gross lesions or discharge, friable  Uterus  anteverted, normal size, shape and consistency, non-tender and mobile  Adnexa  Without masses or tenderness  Anus and perineum  normal   Rectovaginal  normal sphincter tone without palpated masses or tenderness             Hemoccult not indicated     Assessment/Plan:  28  y.o. female for annual exam who is morbidly obese. She has no primary care physician. She would like to have her blood work done today. Screening labs as follows: CBC, compresses a metabolic panel, TSH, fasting lipid profile, urinalysis. A Pap smear was done today. STD screening consisting of: GC and Chlamydia culture, HIV, RPR, hepatitis B and C. Patient will be  scheduled for sonohysterogram in the next few weeks here in the office for better assessment of her uterine cavity secondary to her dysfunctional uterine bleeding. We'll need to discuss referring her to the bariatric surgery for consideration of weight reduction surgery as well as referral to a nutritionist.   Terrance Mass MD, 8:45 AM 01/20/2015

## 2015-01-20 NOTE — Addendum Note (Signed)
Addended by: Thurnell Garbe A on: 01/20/2015 08:54 AM   Modules accepted: Orders, SmartSet

## 2015-01-21 ENCOUNTER — Emergency Department (HOSPITAL_COMMUNITY)
Admission: EM | Admit: 2015-01-21 | Discharge: 2015-01-21 | Disposition: A | Discharge status: Home / Self Care | Payer: BLUE CROSS/BLUE SHIELD | Attending: Emergency Medicine | Admitting: Emergency Medicine

## 2015-01-21 ENCOUNTER — Other Ambulatory Visit: Payer: Self-pay | Admitting: Gynecology

## 2015-01-21 ENCOUNTER — Emergency Department (HOSPITAL_COMMUNITY): Payer: BLUE CROSS/BLUE SHIELD

## 2015-01-21 ENCOUNTER — Encounter (HOSPITAL_COMMUNITY): Payer: Self-pay | Admitting: *Deleted

## 2015-01-21 DIAGNOSIS — I10 Essential (primary) hypertension: Secondary | ICD-10-CM | POA: Diagnosis not present

## 2015-01-21 DIAGNOSIS — E669 Obesity, unspecified: Secondary | ICD-10-CM | POA: Diagnosis not present

## 2015-01-21 DIAGNOSIS — Z3202 Encounter for pregnancy test, result negative: Secondary | ICD-10-CM | POA: Insufficient documentation

## 2015-01-21 DIAGNOSIS — Z8659 Personal history of other mental and behavioral disorders: Secondary | ICD-10-CM | POA: Diagnosis not present

## 2015-01-21 DIAGNOSIS — Z79899 Other long term (current) drug therapy: Secondary | ICD-10-CM | POA: Insufficient documentation

## 2015-01-21 DIAGNOSIS — R109 Unspecified abdominal pain: Secondary | ICD-10-CM | POA: Diagnosis present

## 2015-01-21 DIAGNOSIS — R7989 Other specified abnormal findings of blood chemistry: Secondary | ICD-10-CM

## 2015-01-21 DIAGNOSIS — N390 Urinary tract infection, site not specified: Secondary | ICD-10-CM | POA: Diagnosis not present

## 2015-01-21 LAB — CBC WITH DIFFERENTIAL/PLATELET
Basophils Absolute: 0 10*3/uL (ref 0.0–0.1)
Basophils Relative: 0 % (ref 0–1)
Eosinophils Absolute: 0.2 10*3/uL (ref 0.0–0.7)
Eosinophils Relative: 2 % (ref 0–5)
HCT: 39.8 % (ref 36.0–46.0)
Hemoglobin: 13 g/dL (ref 12.0–15.0)
Lymphocytes Relative: 29 % (ref 12–46)
Lymphs Abs: 3.2 10*3/uL (ref 0.7–4.0)
MCH: 26.8 pg (ref 26.0–34.0)
MCHC: 32.7 g/dL (ref 30.0–36.0)
MCV: 82.1 fL (ref 78.0–100.0)
Monocytes Absolute: 0.7 10*3/uL (ref 0.1–1.0)
Monocytes Relative: 6 % (ref 3–12)
Neutro Abs: 6.7 10*3/uL (ref 1.7–7.7)
Neutrophils Relative %: 63 % (ref 43–77)
Platelets: 389 10*3/uL (ref 150–400)
RBC: 4.85 MIL/uL (ref 3.87–5.11)
RDW: 14.2 % (ref 11.5–15.5)
WBC: 10.7 10*3/uL — ABNORMAL HIGH (ref 4.0–10.5)

## 2015-01-21 LAB — URINE MICROSCOPIC-ADD ON

## 2015-01-21 LAB — URINALYSIS W MICROSCOPIC + REFLEX CULTURE
Bilirubin Urine: NEGATIVE
Casts: NONE SEEN
Glucose, UA: NEGATIVE mg/dL
Hgb urine dipstick: NEGATIVE
Ketones, ur: NEGATIVE mg/dL
Nitrite: NEGATIVE
Protein, ur: NEGATIVE mg/dL
Specific Gravity, Urine: 1.022 (ref 1.005–1.030)
Urobilinogen, UA: 0.2 mg/dL (ref 0.0–1.0)
pH: 5 (ref 5.0–8.0)

## 2015-01-21 LAB — PREGNANCY, URINE: Preg Test, Ur: NEGATIVE

## 2015-01-21 LAB — URINALYSIS, ROUTINE W REFLEX MICROSCOPIC
Bilirubin Urine: NEGATIVE
Glucose, UA: NEGATIVE mg/dL
Hgb urine dipstick: NEGATIVE
Ketones, ur: NEGATIVE mg/dL
Nitrite: NEGATIVE
Protein, ur: NEGATIVE mg/dL
Specific Gravity, Urine: 1.019 (ref 1.005–1.030)
Urobilinogen, UA: 0.2 mg/dL (ref 0.0–1.0)
pH: 5.5 (ref 5.0–8.0)

## 2015-01-21 LAB — COMPREHENSIVE METABOLIC PANEL
ALT: 23 U/L (ref 0–35)
AST: 22 U/L (ref 0–37)
Albumin: 3.5 g/dL (ref 3.5–5.2)
Alkaline Phosphatase: 95 U/L (ref 39–117)
Anion gap: 5 (ref 5–15)
BUN: 8 mg/dL (ref 6–23)
CO2: 27 mmol/L (ref 19–32)
Calcium: 9 mg/dL (ref 8.4–10.5)
Chloride: 107 mmol/L (ref 96–112)
Creatinine, Ser: 0.64 mg/dL (ref 0.50–1.10)
GFR calc Af Amer: >90 mL/min (ref >90)
GFR calc non Af Amer: >90 mL/min (ref >90)
Glucose, Bld: 96 mg/dL (ref 70–99)
Potassium: 3.7 mmol/L (ref 3.5–5.1)
Sodium: 139 mmol/L (ref 135–145)
Total Bilirubin: 0.4 mg/dL (ref 0.3–1.2)
Total Protein: 6.7 g/dL (ref 6.0–8.3)

## 2015-01-21 LAB — CYTOLOGY - PAP

## 2015-01-21 LAB — LIPASE, BLOOD: Lipase: 37 U/L (ref 11–59)

## 2015-01-21 LAB — HIV ANTIBODY (ROUTINE TESTING W REFLEX): HIV 1&2 Ab, 4th Generation: NONREACTIVE

## 2015-01-21 MED ORDER — CEPHALEXIN 500 MG PO CAPS: 500.0000 mg | ORAL_CAPSULE | Freq: Three times a day (TID) | ORAL | Status: DC

## 2015-01-21 MED ORDER — KETOROLAC TROMETHAMINE 60 MG/2ML IM SOLN
60.0000 mg | Freq: Once | INTRAMUSCULAR | Status: AC
  Administered 2015-01-21: 60 mg via INTRAMUSCULAR
  Filled 2015-01-21: qty 2

## 2015-01-21 NOTE — ED Notes (Signed)
Pt states that she has rt sided abdominal pain and tenderness to palpation on rt flank. Pt states that she was seen at [facility] and told that she had a UTI. Pt completed antibiotics and continues to have pain.

## 2015-01-21 NOTE — Discharge Instructions (Signed)
Take the prescribed medication as directed.  Your urine has been sent for culture and you'll be notified if bacteria is resistant to the antibiotic and medication will be changed. Follow-up with primary care physician. Return to the ED for new or worsening symptoms.

## 2015-01-21 NOTE — ED Notes (Signed)
Patient returned from CT

## 2015-01-21 NOTE — ED Provider Notes (Signed)
CSN: [id number]     Arrival date & time 01/21/15  1013 History   First MD Initiated Contact with Patient 01/21/15 1515     Chief Complaint  Patient presents with  . Abdominal Pain     (Consider location/radiation/quality/duration/timing/severity/associated sxs/prior Treatment) The history is provided by the patient and medical records.    29 y.o F presenting today with right-sided abdominal and right-sided flank pain x 2 weeks. She was seen at [facility] ED at symptom onset 2 weeks ago where she was treated for a UTI with Levaquin. She finished the antibiotic on March 10th but continues to have abdominal and back pain. She states that two weeks ago she had fever, chills, nausea, and vomiting but that has since resolved and she is currently only having nausea. Her husband states that she has had a decreased appetite with no associated weight loss. She is able to tolerate food and fluids. Laying down makes the pain worse. Eating does not affect the pain. No known relieving factors. Rates the pain as 9/10.  Denies hematemesis, diarrhea, constipation, cough, chest pain, shortness of breath, dysuria, urinary frequency, abnormal vaginal discharge, hematuria, or history of kidney stones. She was seen by her gynecologist yesterday where a pelvic exam was done. No recent abdominal surgeries.   Past Medical History  Diagnosis Date  . Obesity   . Hypertension   . Allergy   . Anxiety    Past Surgical History  Procedure Laterality Date  . Wisdom tooth extraction    . Tonsillectomy     Family History  Problem Relation Age of Onset  . Hypertension Mother   . Diabetes Father   . Cancer Maternal Grandfather     ?  Marland Kitchen Cancer Paternal Grandmother     ?   History  Substance Use Topics  . Smoking status: Never Smoker   . Smokeless tobacco: Never Used  . Alcohol Use: No   OB History    Gravida Para Term Preterm AB TAB SAB Ectopic Multiple Living   0 0 0 0 0 0 0 0 0 0      Review of  Systems  Gastrointestinal: Positive for abdominal pain.  Genitourinary: Positive for flank pain.  All other systems reviewed and are negative.     Allergies  Codeine  Home Medications   Prior to Admission medications   Medication Sig Start Date End Date Taking? Authorizing Provider  furosemide (LASIX) 40 MG tablet Take 1 tablet (40 mg total) by mouth daily. Patient not taking: Reported on 12/16/2014 06/11/14   Arnoldo Lenis, MD  oxyCODONE-acetaminophen (PERCOCET) 7.5-325 MG per tablet Take 1 tablet by mouth every 4 (four) hours as needed for pain.    Historical Provider, MD   BP 168/89 mmHg  Pulse 98  Temp(Src) 98.2 F (36.8 C) (Oral)  Resp 16  SpO2 100%  LMP 12/23/2014   Physical Exam  Constitutional: She is oriented to person, place, and time. She appears well-developed and well-nourished.  HENT:  Head: Normocephalic and atraumatic.  Cardiovascular: Normal rate, regular rhythm and normal heart sounds.  Exam reveals no gallop and no friction rub.   No murmur heard. Pulmonary/Chest: Effort normal and breath sounds normal.  Abdominal: Soft. She exhibits no distension and no mass. There is tenderness. There is CVA tenderness. There is no rebound and no guarding.  Obese abdomen, tenderness of right CVA and lateral abdomen, no peritonitis  Musculoskeletal: She exhibits tenderness.  Neurological: She is alert and oriented to person, place,  and time.  Skin: Skin is warm and dry.    ED Course  Procedures (including critical care time) Labs Review Labs Reviewed  CBC WITH DIFFERENTIAL/PLATELET - Abnormal; Notable for the following:    WBC 10.7 (*)    All other components within normal limits  URINALYSIS, ROUTINE W REFLEX MICROSCOPIC - Abnormal; Notable for the following:    APPearance CLOUDY (*)    Leukocytes, UA MODERATE (*)    All other components within normal limits  URINE MICROSCOPIC-ADD ON - Abnormal; Notable for the following:    Squamous Epithelial / LPF MANY (*)     Bacteria, UA MANY (*)    All other components within normal limits  COMPREHENSIVE METABOLIC PANEL  LIPASE, BLOOD  PREGNANCY, URINE    Imaging Review Ct Renal Stone Study  01/21/2015   CLINICAL DATA:  29 year old female with right-sided abdominal pain and tenderness to palpation the right flank. Recent history of urinary tract infection, treated with antibiotics, with no resolution of pain.  EXAM: CT ABDOMEN AND PELVIS WITHOUT CONTRAST  TECHNIQUE: Multidetector CT imaging of the abdomen and pelvis was performed following the standard protocol without IV contrast.  COMPARISON:  No priors.  FINDINGS: Lower chest: Small amount of scarring or subsegmental atelectasis in the periphery of the left lower lobe.  Hepatobiliary: No discrete cystic or solid hepatic lesions are identified on today's noncontrast CT examination. The appearance of the gallbladder is normal.  Pancreas: Unremarkable.  Spleen: Unremarkable.  Adrenals/Urinary Tract: There are no abnormal calcifications within the collecting system of either kidney, along the course of either ureter, or within the lumen of the urinary bladder. No hydroureteronephrosis or perinephric stranding to suggest urinary tract obstruction at this time. The unenhanced appearance of the kidneys is unremarkable bilaterally. The unenhanced appearance of the urinary bladder is normal. Bilateral adrenal glands are normal in appearance.  Stomach/Bowel: The unenhanced appearance of the stomach is normal. No pathologic dilatation of small bowel or colon. Normal appendix.  Vascular/Lymphatic: No significant atherosclerotic disease or definite aneurysm identified in the abdominal or pelvic vasculature. No lymphadenopathy noted in the abdomen or pelvis on today's noncontrast CT examination.  Reproductive: Uterus and ovaries are unremarkable in appearance.  Other: No significant volume of ascites.  No pneumoperitoneum.  Musculoskeletal: There are no aggressive appearing lytic or  blastic lesions noted in the visualized portions of the skeleton.  IMPRESSION: 1. No explanation for the patient's right-sided abdominal and flank pain. Specifically, no urinary tract calculi and no findings of urinary tract obstruction are noted at this time. 2. Additionally, the appendix is normal.   Electronically Signed   By: Vinnie Langton M.D.   On: 01/21/2015 18:43     EKG Interpretation None      MDM   Final diagnoses:  Right flank pain  UTI (lower urinary tract infection)   29 year old female with right-sided abdominal pain and flank pain for the past 2 weeks. She was already treated for UTI with Levaquin but continues having symptoms. Evaluated by GYN yesterday with pelvic, normal exam.  On exam, patient is afebrile and nontoxic in appearance. She has noted tenderness of her right flank as well as right lateral abdomen. She has no peritoneal signs.  Labwork is overall reassuring. Urine pregnancy test negative. UA remains infectious, culture pending. CT scan was obtained as she has not had any prior imaging without any noted acute findings, specifically no evidence of renal stone, masses, abscess, or findings concerning for pyelonephritis.  Patient's pain was treated  with toradol here in ED, she is feeling better.  Will treat with keflex pending urine culture. Patient to FU with PCP.  Discussed plan with patient, he/she acknowledged understanding and agreed with plan of care.  Return precautions given for new or worsening symptoms.  Larene Pickett, PA-C 01/21/15 2012  Alfonzo Beers, MD 01/21/15 2027

## 2015-01-22 ENCOUNTER — Telehealth: Payer: Self-pay | Admitting: *Deleted

## 2015-01-22 ENCOUNTER — Telehealth: Payer: Self-pay

## 2015-01-22 LAB — URINE CULTURE: Colony Count: >=100000

## 2015-01-22 LAB — GC/CHLAMYDIA PROBE AMP
CT Probe RNA: NEGATIVE
GC Probe RNA: NEGATIVE

## 2015-01-22 NOTE — Telephone Encounter (Signed)
I called and left detailed msg on voice mail per DPR access provided by patient. I explained why she still needs SH[facility] and to keep her scheduled appt on 02/06/15.

## 2015-01-22 NOTE — Telephone Encounter (Signed)
Pt called stating that she thought she was supposed to receive a pain prescription.  NCM advised her to go to her PCP or Urgent Care facility to obtain pain medication.

## 2015-01-22 NOTE — Telephone Encounter (Signed)
Patient said she was in Monday and you recommended SHGM.  She was in [facility] ER yesterday and they did a CT scan.  She asked if you could reference that and not have to do the [facility]?

## 2015-01-22 NOTE — Telephone Encounter (Signed)
Please inform her that I reviewed the CT scan there were trying to see if she had kidney stones. They did report a normal uterus and ovaries. But this does not tell me inside of the uterus Y she's been having this dysfunctional uterine bleeding and that is the reason why the sonohysterogram and endometrial biopsy

## 2015-02-06 ENCOUNTER — Encounter: Payer: Self-pay | Admitting: Gynecology

## 2015-02-06 ENCOUNTER — Ambulatory Visit (INDEPENDENT_AMBULATORY_CARE_PROVIDER_SITE_OTHER): Payer: BLUE CROSS/BLUE SHIELD

## 2015-02-06 ENCOUNTER — Ambulatory Visit (INDEPENDENT_AMBULATORY_CARE_PROVIDER_SITE_OTHER): Payer: BLUE CROSS/BLUE SHIELD | Admitting: Gynecology

## 2015-02-06 ENCOUNTER — Other Ambulatory Visit: Payer: Self-pay | Admitting: Gynecology

## 2015-02-06 DIAGNOSIS — N938 Other specified abnormal uterine and vaginal bleeding: Secondary | ICD-10-CM | POA: Diagnosis not present

## 2015-02-06 DIAGNOSIS — R198 Other specified symptoms and signs involving the digestive system and abdomen: Secondary | ICD-10-CM

## 2015-02-06 DIAGNOSIS — N920 Excessive and frequent menstruation with regular cycle: Secondary | ICD-10-CM

## 2015-02-06 DIAGNOSIS — N939 Abnormal uterine and vaginal bleeding, unspecified: Secondary | ICD-10-CM

## 2015-02-06 DIAGNOSIS — R1900 Intra-abdominal and pelvic swelling, mass and lump, unspecified site: Secondary | ICD-10-CM

## 2015-02-06 DIAGNOSIS — N915 Oligomenorrhea, unspecified: Secondary | ICD-10-CM

## 2015-02-06 DIAGNOSIS — R946 Abnormal results of thyroid function studies: Secondary | ICD-10-CM

## 2015-02-06 DIAGNOSIS — E038 Other specified hypothyroidism: Secondary | ICD-10-CM

## 2015-02-06 DIAGNOSIS — R7989 Other specified abnormal findings of blood chemistry: Secondary | ICD-10-CM

## 2015-02-06 MED ORDER — MEDROXYPROGESTERONE ACETATE 10 MG PO TABS: ORAL_TABLET | ORAL | Status: DC

## 2015-02-06 NOTE — Progress Notes (Signed)
   Patient presented to the office today for [facility] hysterogram as a result of patient's history of irregular menstrual cycles. She was seen for her annual exam on March 14 when she brought this up for discussion. History is as follows:   Patient reports irregular cycle she is very infrequently having intercourse and not using form of contraception. Several years ago she was started oral contraceptive pill to regulate her cycles. She states her cycles will start for lasting for 3-4 days stop for 2 days and then very heavy for 3 or 4 days afterwards sometimes she may have 2 periods per month. She denies any nipple discharge. No unusual headaches and no visual disturbances. Patient's father is prediabetic and her mother has history of hypertension.  Patient on the last office visit had a CBC, comprehensive metabolic panel, TSH, fasting lipid profile, urinalysis, Pap smear along with GC and Chlamydia culture. STD screen was negative. Her blood work was all normal with the exception of her TSH that was elevated at 6.304.  Ultrasound/sono hysterogram today: Uterus measured 8.8 x 4.6 x 3.4 cm with endometrial stripe 10.6 mm. Patient reported last menstrual period 12/23/2014. Right ovary normal. Left ovary normal. No apparent adnexal masses. The cervix was then cleansed with Betadine solution and a sterile catheter was introduced into the uterine cavity. Normal saline was instilled and there was no intracavitary defect noted. Endometrial stripe was 8.2 mm excluding fluid.  Assessment/plan: Oligomenorrhea/DUB probably due to her morbid obesity and chronic anovulation. Also was noted that she may be hypothyroid with an elevated TSH. A full thyroid panel will be drawn today. If indeed she is hypothyroid she will be started on medication accordingly with follow-up TSH to assess euthyroid. She will be prescribed Provera 10 mg to take 1 by mouth daily for 10 days. She may repeat this every 30 days if she does  not have a spontaneous menses. If she is not using for protection during intercourse she should do a urine principal test first. We are going to check also prolactin level today. She will be referred to the bariatric surgery clinic as well as to the nutritionist.

## 2015-02-06 NOTE — Patient Instructions (Signed)
Hypothyroidism The thyroid is a large gland located in the lower front of your neck. The thyroid gland helps control metabolism. Metabolism is how your body handles food. It controls metabolism with the hormone thyroxine. When this gland is underactive (hypothyroid), it produces too little hormone.  CAUSES These include:   Absence or destruction of thyroid tissue.  Goiter due to iodine deficiency.  Goiter due to medications.  Congenital defects (since birth).  Problems with the pituitary. This causes a lack of TSH (thyroid stimulating hormone). This hormone tells the thyroid to turn out more hormone. SYMPTOMS  Lethargy (feeling as though you have no energy)  Cold intolerance  Weight gain (in spite of normal food intake)  Dry skin  Coarse hair  Menstrual irregularity (if severe, may lead to infertility)  Slowing of thought processes Cardiac problems are also caused by insufficient amounts of thyroid hormone. Hypothyroidism in the newborn is cretinism, and is an extreme form. It is important that this form be treated adequately and immediately or it will lead rapidly to retarded physical and mental development. DIAGNOSIS  To prove hypothyroidism, your caregiver may do blood tests and ultrasound tests. Sometimes the signs are hidden. It may be necessary for your caregiver to watch this illness with blood tests either before or after diagnosis and treatment. TREATMENT  Low levels of thyroid hormone are increased by using synthetic thyroid hormone. This is a safe, effective treatment. It usually takes about four weeks to gain the full effects of the medication. After you have the full effect of the medication, it will generally take another four weeks for problems to leave. Your caregiver may start you on low doses. If you have had heart problems the dose may be gradually increased. It is generally not an emergency to get rapidly to normal. HOME CARE INSTRUCTIONS   Take your  medications as your caregiver suggests. Let your caregiver know of any medications you are taking or start taking. Your caregiver will help you with dosage schedules.  As your condition improves, your dosage needs may increase. It will be necessary to have continuing blood tests as suggested by your caregiver.  Report all suspected medication side effects to your caregiver. SEEK MEDICAL CARE IF: Seek medical care if you develop:  Sweating.  Tremulousness (tremors).  Anxiety.  Rapid weight loss.  Heat intolerance.  Emotional swings.  Diarrhea.  Weakness. SEEK IMMEDIATE MEDICAL CARE IF:  You develop chest pain, an irregular heart beat (palpitations), or a rapid heart beat. MAKE SURE YOU:   Understand these instructions.  Will watch your condition.  Will get help right away if you are not doing well or get worse. Document Released: 10/25/2005 Document Revised: 01/17/2012 Document Reviewed: 06/14/2008 Exit[employer] Patient Information 2015 [employer]. This information is not intended to replace advice given to you by your health care provider. Make sure you discuss any questions you have with your health care provider.  

## 2015-02-07 LAB — THYROID PANEL WITH TSH
Free Thyroxine Index: 1.8 (ref 1.4–3.8)
T3 Uptake: 25 % (ref 22–35)
T4, Total: 7.1 ug/dL (ref 4.5–12.0)
TSH: 3.647 u[IU]/mL (ref 0.350–4.500)

## 2015-02-07 LAB — PROLACTIN: Prolactin: 14 ng/mL

## 2015-06-17 ENCOUNTER — Telehealth (HOSPITAL_COMMUNITY): Payer: Self-pay | Admitting: *Deleted

## 2015-07-01 ENCOUNTER — Telehealth: Payer: Self-pay | Admitting: *Deleted

## 2015-07-01 MED ORDER — NITROFURANTOIN MONOHYD MACRO 100 MG PO CAPS: 100.0000 mg | ORAL_CAPSULE | Freq: Two times a day (BID) | ORAL | Status: DC

## 2015-07-01 NOTE — Telephone Encounter (Signed)
Pt aware, Rx sent. 

## 2015-07-01 NOTE — Telephone Encounter (Signed)
This is not considered a normal medical protocol she should be seen. This just one  time only . This needs to be stressed on all patient's that they need to come and not to call and request prescription when they have a medical problem. Call in [place] one by mouth twice a day for 7 days. If no improvement she needs to be seen somewhere.

## 2015-07-01 NOTE — Telephone Encounter (Signed)
Pt lives in [place] 1 hour away c/o UTI, sharp burning with urination, urine dark in color. Pt is requesting Rx for UTI, I explained to pt OV best for treatment, pt said she lives to far away, declines to go to urgent care and no PCP. Pt aware you are in surgery, pt is willing to wait for response. Please advise

## 2016-03-11 ENCOUNTER — Encounter: Payer: Self-pay | Admitting: Gynecology

## 2016-03-11 ENCOUNTER — Ambulatory Visit (INDEPENDENT_AMBULATORY_CARE_PROVIDER_SITE_OTHER): Payer: BLUE CROSS/BLUE SHIELD | Admitting: Gynecology

## 2016-03-11 VITALS — BP 128/78

## 2016-03-11 DIAGNOSIS — Z113 Encounter for screening for infections with a predominantly sexual mode of transmission: Secondary | ICD-10-CM | POA: Diagnosis not present

## 2016-03-11 DIAGNOSIS — T192XXA Foreign body in vulva and vagina, initial encounter: Secondary | ICD-10-CM | POA: Diagnosis not present

## 2016-03-11 DIAGNOSIS — Z7251 High risk heterosexual behavior: Secondary | ICD-10-CM | POA: Diagnosis not present

## 2016-03-11 MED ORDER — LEVONORGESTREL 0.75 MG PO TABS: 0.7500 mg | ORAL_TABLET | Freq: Two times a day (BID) | ORAL | Status: DC

## 2016-03-11 NOTE — Patient Instructions (Addendum)
Levonorgestrel emergency contraceptive kit What is this medicine? LEVONORGESTREL (LEE voe nor jes trel) is an emergency contraceptive (birth control pill). It prevents pregnancy if taken within the 72 hours after unprotected sex. This medicine will not work if you are already pregnant. This medicine may be used for other purposes; ask your health care provider or pharmacist if you have questions. What should I tell my health care provider before I take this medicine? They need to know if you have or ever had any of these conditions: -blood sugar problems, like diabetes -cancer of the breast, cervix, ovary, uterus, vagina, or unusual vaginal bleeding -fibroids -liver disease -menstrual problems -migraine headaches -an unusual or allergic reaction to levonorgestrel, other medicines, foods, dyes, or preservatives -pregnant or trying to get pregnant -breast-feeding How should I use this medicine? Take this medicine by mouth. Your doctor may want you to use a quick-response pregnancy test prior to using the tablets. Take your medicine as soon as you can after having unprotected sex, preferably in the first 24 hours, but no later than 72 hours (3 days) after the event. Follow the dose instructions of your health care provider exactly. Do not take any extra pills. Extra pills will not decrease your risk of pregnancy, but may increase your risk of side effects. A patient package insert for the product will be given with each prescription and refill. Read this sheet carefully each time. The sheet may change frequently. Contact your pediatrician regarding the use of this medicine in children. Special care may be needed. This medicine has been used in female children who have started having menstrual periods. Overdosage: If you think you have taken too much of this medicine contact a poison control center or emergency room at once. NOTE: This medicine is only for you. Do not share this medicine with  others. What if I miss a dose? If you miss a dose or vomit within 1 hour of taking your dose, you MUST contact your health care professional for instructions. What may interact with this medicine? Do not take this medicine with any of the following medications: -amprenavir -bosentan -fosamprenavir This medicine may also interact with the following medications: -aprepitant -barbiturate medicines for inducing sleep or treating seizures -bexarotene -griseofulvin -medicines to treat seizures like carbamazepine, ethotoin, felbamate, oxcarbazepine, phenytoin, topiramate -modafinil -pioglitazone -rifabutin -rifampin -rifapentine -some medicines to treat HIV infection like atazanavir, indinavir, lopinavir, nelfinavir, tipranavir, ritonavir -St. John's wort -warfarin This list may not describe all possible interactions. Give your health care provider a list of all the medicines, herbs, non-prescription drugs, or dietary supplements you use. Also tell them if you smoke, drink alcohol, or use illegal drugs. Some items may interact with your medicine. What should I watch for while using this medicine? Emergency birth control is not to be used routinely to prevent pregnancy. Discuss birth control options with your health care provider. Make a follow-up appointment to see your health care provider in 3 to 4 weeks after using this medicine. It is common to have spotting after using this medicine. If you miss your next period, the possibility of pregnancy must be considered. See your health care professional as soon as you can and get a pregnancy test. Smoking increases the risk of getting a blood clot or having a stroke while you are taking birth control pills, especially if you are more than 31 years old. You are strongly advised not to smoke. This medicine does not protect you against HIV infection (AIDS) or any other sexually  transmitted diseases. What side effects may I notice from receiving this  medicine? Side effects that you should report to your doctor or health care professional as soon as possible: -Severe side effects are not common. However, the potential for severe side effects may exist and you may want to discuss these with your health care provider. Side effects that usually do not require medical attention (report to your doctor or health care professional if they continue or are bothersome): -abdominal pain or cramping -breast tenderness -dizziness -nausea -spotting This list may not describe all possible side effects. Call your doctor for medical advice about side effects. You may report side effects to FDA at 1-800-FDA-1088. Where should I keep my medicine? Keep out of the reach of children. Store at room temperature between 15 and 30 degrees C (59 and 86 degrees F). Throw away any unused medicine after the expiration date. NOTE: This sheet is a summary. It may not cover all possible information. If you have questions about this medicine, talk to your doctor, pharmacist, or health care provider.    2016, Elsevier/Gold Standard. (2008-10-10 13:51:24)  Etonogestrel implant What is this medicine? ETONOGESTREL (et oh noe JES trel) is a contraceptive (birth control) device. It is used to prevent pregnancy. It can be used for up to 3 years. This medicine may be used for other purposes; ask your health care provider or pharmacist if you have questions. What should I tell my health care provider before I take this medicine? They need to know if you have any of these conditions: -abnormal vaginal bleeding -blood vessel disease or blood clots -cancer of the breast, cervix, or liver -depression -diabetes -gallbladder disease -headaches -heart disease or recent heart attack -high blood pressure -high cholesterol -kidney disease -liver disease -renal disease -seizures -tobacco smoker -an unusual or allergic reaction to etonogestrel, other hormones, anesthetics or  antiseptics, medicines, foods, dyes, or preservatives -pregnant or trying to get pregnant -breast-feeding How should I use this medicine? This device is inserted just under the skin on the inner side of your upper arm by a health care professional. Talk to your pediatrician regarding the use of this medicine in children. Special care may be needed. Overdosage: If you think you have taken too much of this medicine contact a poison control center or emergency room at once. NOTE: This medicine is only for you. Do not share this medicine with others. What if I miss a dose? This does not apply. What may interact with this medicine? Do not take this medicine with any of the following medications: -amprenavir -bosentan -fosamprenavir This medicine may also interact with the following medications: -barbiturate medicines for inducing sleep or treating seizures -certain medicines for fungal infections like ketoconazole and itraconazole -griseofulvin -medicines to treat seizures like carbamazepine, felbamate, oxcarbazepine, phenytoin, topiramate -modafinil -phenylbutazone -rifampin -some medicines to treat HIV infection like atazanavir, indinavir, lopinavir, nelfinavir, tipranavir, ritonavir -St. John's wort This list may not describe all possible interactions. Give your health care provider a list of all the medicines, herbs, non-prescription drugs, or dietary supplements you use. Also tell them if you smoke, drink alcohol, or use illegal drugs. Some items may interact with your medicine. What should I watch for while using this medicine? This product does not protect you against HIV infection (AIDS) or other sexually transmitted diseases. You should be able to feel the implant by pressing your fingertips over the skin where it was inserted. Contact your doctor if you cannot feel the implant, and use a  non-hormonal birth control method (such as condoms) until your doctor confirms that the implant  is in place. If you feel that the implant may have broken or become bent while in your arm, contact your healthcare provider. What side effects may I notice from receiving this medicine? Side effects that you should report to your doctor or health care professional as soon as possible: -allergic reactions like skin rash, itching or hives, swelling of the face, lips, or tongue -breast lumps -changes in emotions or moods -depressed mood -heavy or prolonged menstrual bleeding -pain, irritation, swelling, or bruising at the insertion site -scar at site of insertion -signs of infection at the insertion site such as fever, and skin redness, pain or discharge -signs of pregnancy -signs and symptoms of a blood clot such as breathing problems; changes in vision; chest pain; severe, sudden headache; pain, swelling, warmth in the leg; trouble speaking; sudden numbness or weakness of the face, arm or leg -signs and symptoms of liver injury like dark yellow or brown urine; general ill feeling or flu-like symptoms; light-colored stools; loss of appetite; nausea; right upper belly pain; unusually weak or tired; yellowing of the eyes or skin -unusual vaginal bleeding, discharge -signs and symptoms of a stroke like changes in vision; confusion; trouble speaking or understanding; severe headaches; sudden numbness or weakness of the face, arm or leg; trouble walking; dizziness; loss of balance or coordination Side effects that usually do not require medical attention (Report these to your doctor or health care professional if they continue or are bothersome.): -acne -back pain -breast pain -changes in weight -dizziness -general ill feeling or flu-like symptoms -headache -irregular menstrual bleeding -nausea -sore throat -vaginal irritation or inflammation This list may not describe all possible side effects. Call your doctor for medical advice about side effects. You may report side effects to FDA at  1-800-FDA-1088. Where should I keep my medicine? This drug is given in a hospital or clinic and will not be stored at home. NOTE: This sheet is a summary. It may not cover all possible information. If you have questions about this medicine, talk to your doctor, pharmacist, or health care provider.    2016, Elsevier/Gold Standard. (2014-08-09 14:07:06)

## 2016-03-11 NOTE — Progress Notes (Signed)
HPI: 30 year old who presented to the office today with several concerns. The first one being that she had intercourse last night and could not retrieve the condom. She also wanted to have an STD screen. Review of her record indicated that she has a history of oligomenorrhea. She had been prescribed in the past Provera 10 mg to take every 30 days if she did not have a spontaneous menses. She had an ultrasound and sonohysterogram last year which demonstrated the following: Uterus measured 8.8 x 4.6 x 3.4 cm with endometrial stripe 10.6 mm. Patient reported last menstrual period 12/23/2014. Right ovary normal. Left ovary normal. No apparent adnexal masses. The cervix was then cleansed with Betadine solution and a sterile catheter was introduced into the uterine cavity. Normal saline was instilled and there was no intracavitary defect noted. Endometrial stripe was 8.2 mm excluding fluid.  ROS: A ROS was performed and pertinent positives and negatives are included in the history.  GENERAL: No fevers or chills. HEENT: No change in vision, no earache, sore throat or sinus congestion. NECK: No pain or stiffness. CARDIOVASCULAR: No chest pain or pressure. No palpitations. PULMONARY: No shortness of breath, cough or wheeze. GASTROINTESTINAL: No abdominal pain, nausea, vomiting or diarrhea, melena or bright red blood per rectum. GENITOURINARY: No urinary frequency, urgency, hesitancy or dysuria. MUSCULOSKELETAL: No joint or muscle pain, no back pain, no recent trauma. DERMATOLOGIC: No rash, no itching, no lesions. ENDOCRINE: No polyuria, polydipsia, no heat or cold intolerance. No recent change in weight. HEMATOLOGICAL: No anemia or easy bruising or bleeding. NEUROLOGIC: No headache, seizures, numbness, tingling or weakness. PSYCHIATRIC: No depression, no loss of interest in normal activity or change in sleep pattern.   PE: Obese white female with above-mentioned complaint Pelvic: Bartholin urethra Skene was within  normal limits A speculum was introduced in the vagina and a red condom was noted and retrieved. Cervix: GC and Chlamydia culture was obtained Bimanual exam not done Rectal exam: Not done   Assessment Plan: #1 form body retrieve the vagina condom. Patient currently on day 13 of her cycle she will be prescribed Plan B for emergency contraception. #2 STD screening consisting of GC and Chlamydia culture along with HIV, RPR, hepatitis B and C #3 patient was given literature information and counseling on the [place] contraception which she is interested in having placed. She will return back during her menses to have the Nexplanon inserted.    Greater than 50% of time was spent in counseling and coordinating care of this patient. Time of consultation: 15   Minutes.

## 2016-03-12 ENCOUNTER — Telehealth: Payer: Self-pay

## 2016-03-12 LAB — HEPATITIS B SURFACE ANTIGEN: Hepatitis B Surface Ag: NEGATIVE

## 2016-03-12 LAB — RPR

## 2016-03-12 LAB — GC/CHLAMYDIA PROBE AMP
CT Probe RNA: NOT DETECTED
GC Probe RNA: NOT DETECTED

## 2016-03-12 LAB — HIV ANTIBODY (ROUTINE TESTING W REFLEX): HIV 1&2 Ab, 4th Generation: NONREACTIVE

## 2016-03-12 LAB — HEPATITIS C ANTIBODY: HCV Ab: NEGATIVE

## 2016-03-12 MED ORDER — LEVONORGESTREL 1.5 MG PO TABS: ORAL_TABLET | ORAL | Status: DC

## 2016-03-12 NOTE — Telephone Encounter (Signed)
Note faxed from pharmacy that "onley the Plan B One Step 1.5 mg is available in the market.". Dr. Moshe Salisbury wrote back on note to prescribed the Plan B 1.5 #2  q 12 hours. Rx sent.

## 2016-03-16 ENCOUNTER — Telehealth: Payer: Self-pay | Admitting: Gynecology

## 2016-03-16 NOTE — Telephone Encounter (Signed)
03/16/16-Pt was advised today that her [facility] ins will cover the Nexplanon for contraception at 100%, no copay. Reminded need to be inserted while on cycle with JF. Per Roy@BC -[phone]

## 2016-04-22 ENCOUNTER — Encounter: Payer: BLUE CROSS/BLUE SHIELD | Admitting: Gynecology

## 2016-05-03 ENCOUNTER — Encounter: Payer: Self-pay | Admitting: Gynecology

## 2016-05-03 ENCOUNTER — Ambulatory Visit (INDEPENDENT_AMBULATORY_CARE_PROVIDER_SITE_OTHER): Payer: BLUE CROSS/BLUE SHIELD | Admitting: Gynecology

## 2016-05-03 VITALS — BP 136/80 | Ht 65.0 in | Wt 346.0 lb

## 2016-05-03 DIAGNOSIS — Z30017 Encounter for initial prescription of implantable subdermal contraceptive: Secondary | ICD-10-CM | POA: Diagnosis not present

## 2016-05-03 DIAGNOSIS — Z975 Presence of (intrauterine) contraceptive device: Secondary | ICD-10-CM | POA: Insufficient documentation

## 2016-05-03 NOTE — Progress Notes (Signed)
   Patient is a 30 year old presented to the office today for placement of Nexplanon for contraception. She was seen in the office on May 4 who was requesting an STD screen and had unprotected intercourse and had been given Plan B. Her STD screen demonstrated that she had a negative HIV, RPR, hepatitis B and C. She presented in provided with literature information on the Nexplanon. She's currently menstruating.                                              Nexplanon Procedure Note (insertion)     The patient was laying on her back with her nondominant left arm flexed at the elbow and externally rotated. The insertion site was identified as the underside of the nondominant upper arm approximately 8 cm from the medial epicondyle of the humerus. 2 marks were made with a sterile marker: The first marked the spot where the Nexplanon  implant was to be inserted, and a second, marked a spot a few centimeters proximal to the first marke to guide the direction of the insertion. The area was cleansed with Betadine solution. The area was anesthetized with 1% lidocaine  (1 cc)  at the area the injection site and underneath the skin along the planned insertion tunnel. The preloaded disposable Nexplanon was removed from its sterile casing.  The applicator was held above the needle at the textured surface area. The transparent protector was removed. With a freehand, the skin was stretched around the insertion site with a thumb and index finger. The skin was then punctured with the tip of the needle angled at 30. The Nexplanon applicator was lowered to a horizontal position. While lifting the skin with the tip of the needle the needle was then slid to its full length. The applicator was kept in sitting position with a needle inserted to its full length. The purple slider was unlocked by pushing it slightly downward. The slider was fully moved back until it stopped. This allowed the implant to be in the final subdermal  position and the needle was locked inside the body of the applicator. The applicator was then removed. A Steri-Strip was made over the incision and a Kerlix wrap was placed which patient is to remove tomorrow. No complications patient tolerated procedure well and was released home with instructions.   [facility] HMD5:11 PMTD@

## 2016-05-03 NOTE — Patient Instructions (Signed)
Etonogestrel implant What is this medicine? ETONOGESTREL (et oh noe JES trel) is a contraceptive (birth control) device. It is used to prevent pregnancy. It can be used for up to 3 years. This medicine may be used for other purposes; ask your health care provider or pharmacist if you have questions. What should I tell my health care provider before I take this medicine? They need to know if you have any of these conditions: -abnormal vaginal bleeding -blood vessel disease or blood clots -cancer of the breast, cervix, or liver -depression -diabetes -gallbladder disease -headaches -heart disease or recent heart attack -high blood pressure -high cholesterol -kidney disease -liver disease -renal disease -seizures -tobacco smoker -an unusual or allergic reaction to etonogestrel, other hormones, anesthetics or antiseptics, medicines, foods, dyes, or preservatives -pregnant or trying to get pregnant -breast-feeding How should I use this medicine? This device is inserted just under the skin on the inner side of your upper arm by a health care professional. Talk to your pediatrician regarding the use of this medicine in children. Special care may be needed. Overdosage: If you think you have taken too much of this medicine contact a poison control center or emergency room at once. NOTE: This medicine is only for you. Do not share this medicine with others. What if I miss a dose? This does not apply. What may interact with this medicine? Do not take this medicine with any of the following medications: -amprenavir -bosentan -fosamprenavir This medicine may also interact with the following medications: -barbiturate medicines for inducing sleep or treating seizures -certain medicines for fungal infections like ketoconazole and itraconazole -griseofulvin -medicines to treat seizures like carbamazepine, felbamate, oxcarbazepine, phenytoin,  topiramate -modafinil -phenylbutazone -rifampin -some medicines to treat HIV infection like atazanavir, indinavir, lopinavir, nelfinavir, tipranavir, ritonavir -St. John's wort This list may not describe all possible interactions. Give your health care provider a list of all the medicines, herbs, non-prescription drugs, or dietary supplements you use. Also tell them if you smoke, drink alcohol, or use illegal drugs. Some items may interact with your medicine. What should I watch for while using this medicine? This product does not protect you against HIV infection (AIDS) or other sexually transmitted diseases. You should be able to feel the implant by pressing your fingertips over the skin where it was inserted. Contact your doctor if you cannot feel the implant, and use a non-hormonal birth control method (such as condoms) until your doctor confirms that the implant is in place. If you feel that the implant may have broken or become bent while in your arm, contact your healthcare provider. What side effects may I notice from receiving this medicine? Side effects that you should report to your doctor or health care professional as soon as possible: -allergic reactions like skin rash, itching or hives, swelling of the face, lips, or tongue -breast lumps -changes in emotions or moods -depressed mood -heavy or prolonged menstrual bleeding -pain, irritation, swelling, or bruising at the insertion site -scar at site of insertion -signs of infection at the insertion site such as fever, and skin redness, pain or discharge -signs of pregnancy -signs and symptoms of a blood clot such as breathing problems; changes in vision; chest pain; severe, sudden headache; pain, swelling, warmth in the leg; trouble speaking; sudden numbness or weakness of the face, arm or leg -signs and symptoms of liver injury like dark yellow or brown urine; general ill feeling or flu-like symptoms; light-colored stools; loss of  appetite; nausea; right upper belly   pain; unusually weak or tired; yellowing of the eyes or skin -unusual vaginal bleeding, discharge -signs and symptoms of a stroke like changes in vision; confusion; trouble speaking or understanding; severe headaches; sudden numbness or weakness of the face, arm or leg; trouble walking; dizziness; loss of balance or coordination Side effects that usually do not require medical attention (Report these to your doctor or health care professional if they continue or are bothersome.): -acne -back pain -breast pain -changes in weight -dizziness -general ill feeling or flu-like symptoms -headache -irregular menstrual bleeding -nausea -sore throat -vaginal irritation or inflammation This list may not describe all possible side effects. Call your doctor for medical advice about side effects. You may report side effects to FDA at 1-800-FDA-1088. Where should I keep my medicine? This drug is given in a hospital or clinic and will not be stored at home. NOTE: This sheet is a summary. It may not cover all possible information. If you have questions about this medicine, talk to your doctor, pharmacist, or health care provider.    2016, Elsevier/Gold Standard. (2014-08-09 14:07:06)  

## 2016-05-04 ENCOUNTER — Encounter: Payer: Self-pay | Admitting: Anesthesiology

## 2016-05-18 ENCOUNTER — Ambulatory Visit (INDEPENDENT_AMBULATORY_CARE_PROVIDER_SITE_OTHER): Payer: BLUE CROSS/BLUE SHIELD | Admitting: Gynecology

## 2016-05-18 ENCOUNTER — Encounter: Payer: Self-pay | Admitting: Gynecology

## 2016-05-18 VITALS — BP 134/86 | Ht 65.0 in | Wt 294.0 lb

## 2016-05-18 DIAGNOSIS — Z01419 Encounter for gynecological examination (general) (routine) without abnormal findings: Secondary | ICD-10-CM | POA: Diagnosis not present

## 2016-05-18 NOTE — Progress Notes (Signed)
[employer] Aug 25, 1986 [id number]   History:    30 y.o.  for annual gyn exam with no complaints today. In June of this year she had the Nexplanon placed on her left arm and has done well. Patient in the past never received the HPV vaccine series. Patient with no past history of any abnormal Pap smear. Last year she was weighing 346 pounds and has lost 52 pounds and currently weighing 292 pounds. She will return back next week for fasting blood work.  Past medical history,surgical history, family history and social history were all reviewed and documented in the EPIC chart.  Gynecologic History Patient's last menstrual period was 04/29/2016. Contraception: Nexplanon Last Pap: 2016. Results were: normal Last mammogram: Not indicated. Results were: Not indicated  Obstetric History OB History  Gravida Para Term Preterm AB SAB TAB Ectopic Multiple Living  0 0 0 0 0 0 0 0 0 0          ROS: A ROS was performed and pertinent positives and negatives are included in the history.  GENERAL: No fevers or chills. HEENT: No change in vision, no earache, sore throat or sinus congestion. NECK: No pain or stiffness. CARDIOVASCULAR: No chest pain or pressure. No palpitations. PULMONARY: No shortness of breath, cough or wheeze. GASTROINTESTINAL: No abdominal pain, nausea, vomiting or diarrhea, melena or bright red blood per rectum. GENITOURINARY: No urinary frequency, urgency, hesitancy or dysuria. MUSCULOSKELETAL: No joint or muscle pain, no back pain, no recent trauma. DERMATOLOGIC: No rash, no itching, no lesions. ENDOCRINE: No polyuria, polydipsia, no heat or cold intolerance. No recent change in weight. HEMATOLOGICAL: No anemia or easy bruising or bleeding. NEUROLOGIC: No headache, seizures, numbness, tingling or weakness. PSYCHIATRIC: No depression, no loss of interest in normal activity or change in sleep pattern.     Exam: chaperone present  BP 134/86 mmHg  Ht 5\' 5"  (1.651 m)  Wt 294 lb  (133.358 kg)  BMI 48.92 kg/m2  LMP 04/29/2016  Body mass index is 48.92 kg/(m^2).  General appearance : Well developed well nourished female. No acute distress HEENT: Eyes: no retinal hemorrhage or exudates,  Neck supple, trachea midline, no carotid bruits, no thyroidmegaly Lungs: Clear to auscultation, no rhonchi or wheezes, or rib retractions  Heart: Regular rate and rhythm, no murmurs or gallops Breast:Examined in sitting and supine position were symmetrical in appearance, no palpable masses or tenderness,  no skin retraction, no nipple inversion, no nipple discharge, no skin discoloration, no axillary or supraclavicular lymphadenopathy Abdomen: no palpable masses or tenderness, no rebound or guarding Extremities: no edema or skin discoloration or tenderness  Pelvic:  Bartholin, Urethra, Skene Glands: Within normal limits             Vagina: No gross lesions or discharge  Cervix: No gross lesions or discharge  Uterus  anteverted, normal size, shape and consistency, non-tender and mobile  Adnexa  Without masses or tenderness  Anus and perineum  normal   Rectovaginal  normal sphincter tone without palpated masses or tenderness             Hemoccult not indicated     Assessment/Plan:  30 y.o. female for annual exam doing well since she went on the ketogenic diet has lost 52 pounds. She is also exercising as well. She will return back to the office sometime next week in a fasting state for the following screening blood work: Comprehensive metabolic panel, fasting lipid profile, TSH, CBC, and urinalysis.   Terrance Mass  MD, 4:02 PM 05/18/2016

## 2016-05-25 ENCOUNTER — Other Ambulatory Visit: Payer: BLUE CROSS/BLUE SHIELD

## 2016-05-26 ENCOUNTER — Other Ambulatory Visit: Payer: BLUE CROSS/BLUE SHIELD

## 2016-05-26 DIAGNOSIS — Z01419 Encounter for gynecological examination (general) (routine) without abnormal findings: Secondary | ICD-10-CM

## 2016-05-26 LAB — CBC WITH DIFFERENTIAL/PLATELET
Basophils Absolute: 0 cells/uL (ref 0–200)
Basophils Relative: 0 %
Eosinophils Absolute: 264 cells/uL (ref 15–500)
Eosinophils Relative: 3 %
HCT: 43.4 % (ref 35.0–45.0)
Hemoglobin: 14.6 g/dL (ref 11.7–15.5)
Lymphocytes Relative: 31 %
Lymphs Abs: 2728 cells/uL (ref 850–3900)
MCH: 28.1 pg (ref 27.0–33.0)
MCHC: 33.6 g/dL (ref 32.0–36.0)
MCV: 83.6 fL (ref 80.0–100.0)
MPV: 9.7 fL (ref 7.5–12.5)
Monocytes Absolute: 528 cells/uL (ref 200–950)
Monocytes Relative: 6 %
Neutro Abs: 5280 cells/uL (ref 1500–7800)
Neutrophils Relative %: 60 %
Platelets: 362 10*3/uL (ref 140–400)
RBC: 5.19 MIL/uL — ABNORMAL HIGH (ref 3.80–5.10)
RDW: 14.3 % (ref 11.0–15.0)
WBC: 8.8 10*3/uL (ref 3.8–10.8)

## 2016-05-26 LAB — TSH: TSH: 1.69 mIU/L

## 2016-05-27 LAB — COMPREHENSIVE METABOLIC PANEL
ALT: 21 U/L (ref 6–29)
AST: 17 U/L (ref 10–30)
Albumin: 4.3 g/dL (ref 3.6–5.1)
Alkaline Phosphatase: 82 U/L (ref 33–115)
BUN: 11 mg/dL (ref 7–25)
CO2: 15 mmol/L — ABNORMAL LOW (ref 20–31)
Calcium: 9.3 mg/dL (ref 8.6–10.2)
Chloride: 106 mmol/L (ref 98–110)
Creat: 0.82 mg/dL (ref 0.50–1.10)
Glucose, Bld: 84 mg/dL (ref 65–99)
Potassium: 4.9 mmol/L (ref 3.5–5.3)
Sodium: 138 mmol/L (ref 135–146)
Total Bilirubin: 1 mg/dL (ref 0.2–1.2)
Total Protein: 7.2 g/dL (ref 6.1–8.1)

## 2016-05-27 LAB — URINALYSIS W MICROSCOPIC + REFLEX CULTURE
Bilirubin Urine: NEGATIVE
Crystals: NONE SEEN [HPF]
Glucose, UA: NEGATIVE
Ketones, ur: NEGATIVE
Nitrite: NEGATIVE
Specific Gravity, Urine: 1.026 (ref 1.001–1.035)
Yeast: NONE SEEN [HPF]
pH: 6 (ref 5.0–8.0)

## 2016-05-27 LAB — LIPID PANEL
Cholesterol: 145 mg/dL (ref 125–200)
HDL: 35 mg/dL — ABNORMAL LOW (ref >=46)
LDL Cholesterol: 95 mg/dL (ref <130)
Total CHOL/HDL Ratio: 4.1 Ratio (ref <=5.0)
Triglycerides: 77 mg/dL (ref <150)
VLDL: 15 mg/dL (ref <30)

## 2016-05-28 ENCOUNTER — Other Ambulatory Visit: Payer: Self-pay | Admitting: Gynecology

## 2016-05-28 DIAGNOSIS — R3129 Other microscopic hematuria: Secondary | ICD-10-CM

## 2016-05-28 LAB — URINE CULTURE: Organism ID, Bacteria: 10000

## 2016-06-11 ENCOUNTER — Telehealth: Payer: Self-pay | Admitting: *Deleted

## 2016-06-11 NOTE — Telephone Encounter (Signed)
Office visit

## 2016-06-11 NOTE — Telephone Encounter (Signed)
Pt had Nexplanon placed in June, called today c/o spotting/ bleeding x 1 month now. Pt said the bleeding varies period flow (wearing at thick panty liner) to spotting, was told to come back to repeat urine due to blood in her urine but she hasn't stopped bleeding to do so. States the bleeding is effecting her diet, trying to lost more weight. She asked if this is normal? When should bleeding regulate? Please advise

## 2016-06-11 NOTE — Telephone Encounter (Signed)
Left on pt voicemail the below.

## 2016-06-16 ENCOUNTER — Ambulatory Visit (INDEPENDENT_AMBULATORY_CARE_PROVIDER_SITE_OTHER): Payer: BLUE CROSS/BLUE SHIELD | Admitting: Gynecology

## 2016-06-16 ENCOUNTER — Encounter: Payer: Self-pay | Admitting: Gynecology

## 2016-06-16 VITALS — BP 130/80 | Ht 65.0 in | Wt 294.0 lb

## 2016-06-16 DIAGNOSIS — N921 Excessive and frequent menstruation with irregular cycle: Secondary | ICD-10-CM

## 2016-06-16 DIAGNOSIS — Z975 Presence of (intrauterine) contraceptive device: Secondary | ICD-10-CM | POA: Diagnosis not present

## 2016-06-16 MED ORDER — ESTRADIOL 0.5 MG PO TABS: 0.5000 mg | ORAL_TABLET | Freq: Every day | ORAL | 0 refills | Status: DC

## 2016-06-16 NOTE — Patient Instructions (Signed)
Levonorgestrel intrauterine device (IUD) What is this medicine? LEVONORGESTREL IUD (LEE voe nor jes trel) is a contraceptive (birth control) device. The device is placed inside the uterus by a healthcare professional. It is used to prevent pregnancy and can also be used to treat heavy bleeding that occurs during your period. Depending on the device, it can be used for 3 to 5 years. This medicine may be used for other purposes; ask your health care provider or pharmacist if you have questions. What should I tell my health care provider before I take this medicine? They need to know if you have any of these conditions: -abnormal Pap smear -cancer of the breast, uterus, or cervix -diabetes -endometritis -genital or pelvic infection now or in the past -have more than one sexual partner or your partner has more than one partner -heart disease -history of an ectopic or tubal pregnancy -immune system problems -IUD in place -liver disease or tumor -problems with blood clots or take blood-thinners -use intravenous drugs -uterus of unusual shape -vaginal bleeding that has not been explained -an unusual or allergic reaction to levonorgestrel, other hormones, silicone, or polyethylene, medicines, foods, dyes, or preservatives -pregnant or trying to get pregnant -breast-feeding How should I use this medicine? This device is placed inside the uterus by a health care professional. Talk to your pediatrician regarding the use of this medicine in children. Special care may be needed. Overdosage: If you think you have taken too much of this medicine contact a poison control center or emergency room at once. NOTE: This medicine is only for you. Do not share this medicine with others. What if I miss a dose? This does not apply. What may interact with this medicine? Do not take this medicine with any of the following medications: -amprenavir -bosentan -fosamprenavir This medicine may also interact with  the following medications: -aprepitant -barbiturate medicines for inducing sleep or treating seizures -bexarotene -griseofulvin -medicines to treat seizures like carbamazepine, ethotoin, felbamate, oxcarbazepine, phenytoin, topiramate -modafinil -pioglitazone -rifabutin -rifampin -rifapentine -some medicines to treat HIV infection like atazanavir, indinavir, lopinavir, nelfinavir, tipranavir, ritonavir -St. John's wort -warfarin This list may not describe all possible interactions. Give your health care provider a list of all the medicines, herbs, non-prescription drugs, or dietary supplements you use. Also tell them if you smoke, drink alcohol, or use illegal drugs. Some items may interact with your medicine. What should I watch for while using this medicine? Visit your doctor or health care professional for regular check ups. See your doctor if you or your partner has sexual contact with others, becomes HIV positive, or gets a sexual transmitted disease. This product does not protect you against HIV infection (AIDS) or other sexually transmitted diseases. You can check the placement of the IUD yourself by reaching up to the top of your vagina with clean fingers to feel the threads. Do not pull on the threads. It is a good habit to check placement after each menstrual period. Call your doctor right away if you feel more of the IUD than just the threads or if you cannot feel the threads at all. The IUD may come out by itself. You may become pregnant if the device comes out. If you notice that the IUD has come out use a backup birth control method like condoms and call your health care provider. Using tampons will not change the position of the IUD and are okay to use during your period. What side effects may I notice from receiving this medicine?   Side effects that you should report to your doctor or health care professional as soon as possible: -allergic reactions like skin rash, itching or  hives, swelling of the face, lips, or tongue -fever, flu-like symptoms -genital sores -high blood pressure -no menstrual period for 6 weeks during use -pain, swelling, warmth in the leg -pelvic pain or tenderness -severe or sudden headache -signs of pregnancy -stomach cramping -sudden shortness of breath -trouble with balance, talking, or walking -unusual vaginal bleeding, discharge -yellowing of the eyes or skin Side effects that usually do not require medical attention (report to your doctor or health care professional if they continue or are bothersome): -acne -breast pain -change in sex drive or performance -changes in weight -cramping, dizziness, or faintness while the device is being inserted -headache -irregular menstrual bleeding within first 3 to 6 months of use -nausea This list may not describe all possible side effects. Call your doctor for medical advice about side effects. You may report side effects to FDA at 1-800-FDA-1088. Where should I keep my medicine? This does not apply. NOTE: This sheet is a summary. It may not cover all possible information. If you have questions about this medicine, talk to your doctor, pharmacist, or health care provider.    2016, Elsevier/Gold Standard. (2011-11-25 13:54:04)  

## 2016-06-16 NOTE — Progress Notes (Signed)
   Patient is a 30 year old presented to the office today stating this is June that she had the Nexplanon placement or left arm she has had breakthrough bleeding some days more heavier some days just light spotting. She was seen for her annual gynecological examination July of this year and did not have any major complaints at that time. She does weigh 346 pounds and states that she lost 52 pounds and is currently weighing 292 pounds. Her blood work July 19 of this year demonstrated a normal comprehensive metabolic panel, lipid profile, and CBC, as well as TSH.  Exam: Weight 294 pounds   blood pressure 130/80 Gen. appearance: Patient no acute distress Abdomen: Soft nontender no rebound or guarding Pelvic: Bartholin urethra Skene was within normal limits Vagina: No lesions or discharge Cervix: No lesions or discharge Uterus: Anteverted normal size shape and consistency Adnexa: No palpable mass or tenderness  Nexplanon was palpated on the upper medial side of left arm.  Assessment/plan morbidly obese Caucasian female with Nexplanon placed in June 2017 with breakthrough bleeding. We are going to place her on Estrace 0.5 mg take 1 by mouth daily for 21 days to build up her endometrium and then monitor her cycles. I've also asked her to take ibuprofen 600 mg 3 times a day with meals for 5-7 days. I've also given her literature information the Mirena IUD and she continues to have breakthrough bleeding with the Nexplanon. The risks benefits and pros and cons of the above treatment plan was discussed and all questions were answered. Greater than 50% time was spent counseling quantity care of for this patient.

## 2016-07-14 ENCOUNTER — Telehealth: Payer: Self-pay

## 2016-07-14 NOTE — Telephone Encounter (Signed)
Patient informed. She wants to schedule to have Nexplanon removed. She said she is tired of bleeding. She wants [place] to check benefits for Mirena and she wants to read about it. We will call her with benefits.  Rosemarie Ax will call her to schedule removal of Nexplanon.

## 2016-07-14 NOTE — Telephone Encounter (Signed)
She was recently seen on do believe I prescribed her Estrace 0.5 mg take 1 by mouth daily for 21 days. If she continues to bleed and she needs to come in to the office we'll take the Nexplanon out and we had talked about placing a Mirena IUD

## 2016-07-14 NOTE — Telephone Encounter (Signed)
Spotting daily with Nexplanon continues. Has to wear a pantyliner every day. Some days a little more than others but spotting.

## 2016-07-15 ENCOUNTER — Ambulatory Visit (INDEPENDENT_AMBULATORY_CARE_PROVIDER_SITE_OTHER): Payer: BLUE CROSS/BLUE SHIELD | Admitting: Gynecology

## 2016-07-15 ENCOUNTER — Encounter: Payer: Self-pay | Admitting: Gynecology

## 2016-07-15 ENCOUNTER — Encounter: Payer: Self-pay | Admitting: *Deleted

## 2016-07-15 VITALS — BP 130/78 | Ht 65.0 in | Wt 294.0 lb

## 2016-07-15 DIAGNOSIS — Z3046 Encounter for surveillance of implantable subdermal contraceptive: Secondary | ICD-10-CM

## 2016-07-15 DIAGNOSIS — Z3049 Encounter for surveillance of other contraceptives: Secondary | ICD-10-CM

## 2016-07-15 DIAGNOSIS — N938 Other specified abnormal uterine and vaginal bleeding: Secondary | ICD-10-CM

## 2016-07-15 NOTE — Progress Notes (Signed)
I called [employer] and spoke to [place] for benefits for Mirena and insertion.  These are covered at 100%. Pt informed. KW CMA

## 2016-07-15 NOTE — Progress Notes (Signed)
   Patient presented to the office today once again because of her breakthrough bleeding on the Nexplanon. She was seen in the office on August 9 and the following history:  "Patient stated that since this June when she had the Nexplanon placed on her left arm has had breakthrough bleeding some days more heavier some days just light spotting. She was seen for her annual gynecological examination July of this year and did not have any major complaints at that time. She does weigh 346 pounds and states that she lost 52 pounds and is currently weighing 292 pounds. Her blood work July 19 of this year demonstrated a normal comprehensive metabolic panel, lipid profile, and CBC, as well as TSH."  She was placed on Estrace 0.5 mg daily and effort to stabilize her endometrium for 21 days S continue to bleed and wanted to have the Nexplanon removed and have the Mirena IUD placed the following week. She had also been given ibuprofen 600 mg to take 1 by mouth 3 times a day for 5-7 days as well but this has not helped.                                                              Nexplanon procedure note (removal)  The patient presented to the office today requesting for removal of her Nexplanon that was placed in the year June 2017 on her left arm.   On examination the nexplanon implant was palpated and the distal end  (end  closest to the elbow) was marked. The area was sterilized with Betadine solution. 1% lidocaine was used for local anesthesia and approximately 1 cc  was injected into the site that was marked where  the incision was to be made. The local anesthetic was injected under the implant in an effort to keep it  close to the skin surface. Slight pressure pushing downward was made at the proximal end  of the implant in an effort to stabilize it. A bulge appeared indicating the distal end of the implant. Starting at the distal tip of the implant, a small longitudinal incision of 2 mm was made towards the  elbow. By gently pushing the implant toward the incision the tip became visible. Grasping the implant with a curved forcep facilitated in gently removing the implant. Full confirmation of the entire implant which is 4 cm long was inspected and was intact and was shown to the patient and discarded. After removing the implant, the incision was reapproximated with 3 interrupted sutures of 4-0 Vicryl suture. Patient will be instructed to remove the pressure bandage in 24 hours and return to the office next week to remove the suture and for placement of Mirena IUD. Literature information was provided.

## 2016-07-19 ENCOUNTER — Ambulatory Visit (INDEPENDENT_AMBULATORY_CARE_PROVIDER_SITE_OTHER): Payer: BLUE CROSS/BLUE SHIELD | Admitting: Gynecology

## 2016-07-19 ENCOUNTER — Encounter: Payer: Self-pay | Admitting: Gynecology

## 2016-07-19 VITALS — BP 132/86

## 2016-07-19 DIAGNOSIS — Z975 Presence of (intrauterine) contraceptive device: Secondary | ICD-10-CM | POA: Insufficient documentation

## 2016-07-19 DIAGNOSIS — N921 Excessive and frequent menstruation with irregular cycle: Secondary | ICD-10-CM | POA: Diagnosis not present

## 2016-07-19 DIAGNOSIS — Z30430 Encounter for insertion of intrauterine contraceptive device: Secondary | ICD-10-CM | POA: Diagnosis not present

## 2016-07-19 DIAGNOSIS — R1030 Lower abdominal pain, unspecified: Secondary | ICD-10-CM

## 2016-07-19 MED ORDER — MEGESTROL ACETATE 40 MG PO TABS: 40.0000 mg | ORAL_TABLET | Freq: Two times a day (BID) | ORAL | 1 refills | Status: DC

## 2016-07-19 MED ORDER — KETOROLAC TROMETHAMINE 30 MG/ML IJ SOLN
30.0000 mg | Freq: Once | INTRAMUSCULAR | Status: AC
  Administered 2016-07-19: 30 mg via INTRAMUSCULAR

## 2016-07-19 NOTE — Patient Instructions (Signed)
IUD PLACEMENT POST-PROCEDURE INSTRUCTIONS  1. You may take Ibuprofen, Aleve or Tylenol for pain if needed.  Cramping should resolve within in 24 hours.  2. You may have a small amount of spotting.  You should wear a mini pad for the next few days.  3. You may have intercourse after 24 hours.  If you using this for birth control, it is effective immediately.  4. You need to call if you have any pelvic pain, fever, heavy bleeding or foul smelling vaginal discharge.  Irregular bleeding is common the first several months after having an IUD placed. You do not need to call for this reason unless you are concerned.  5. Shower or bathe as normal  You should have a follow-up appointment in 4-8 weeks for a re-check to make sure you are not having any problems.Levonorgestrel intrauterine device (IUD) What is this medicine? LEVONORGESTREL IUD (LEE voe nor jes trel) is a contraceptive (birth control) device. The device is placed inside the uterus by a healthcare professional. It is used to prevent pregnancy and can also be used to treat heavy bleeding that occurs during your period. Depending on the device, it can be used for 3 to 5 years. This medicine may be used for other purposes; ask your health care provider or pharmacist if you have questions. What should I tell my health care provider before I take this medicine? They need to know if you have any of these conditions: -abnormal Pap smear -cancer of the breast, uterus, or cervix -diabetes -endometritis -genital or pelvic infection now or in the past -have more than one sexual partner or your partner has more than one partner -heart disease -history of an ectopic or tubal pregnancy -immune system problems -IUD in place -liver disease or tumor -problems with blood clots or take blood-thinners -use intravenous drugs -uterus of unusual shape -vaginal bleeding that has not been explained -an unusual or allergic reaction to levonorgestrel, other  hormones, silicone, or polyethylene, medicines, foods, dyes, or preservatives -pregnant or trying to get pregnant -breast-feeding How should I use this medicine? This device is placed inside the uterus by a health care professional. Talk to your pediatrician regarding the use of this medicine in children. Special care may be needed. Overdosage: If you think you have taken too much of this medicine contact a poison control center or emergency room at once. NOTE: This medicine is only for you. Do not share this medicine with others. What if I miss a dose? This does not apply. What may interact with this medicine? Do not take this medicine with any of the following medications: -amprenavir -bosentan -fosamprenavir This medicine may also interact with the following medications: -aprepitant -barbiturate medicines for inducing sleep or treating seizures -bexarotene -griseofulvin -medicines to treat seizures like carbamazepine, ethotoin, felbamate, oxcarbazepine, phenytoin, topiramate -modafinil -pioglitazone -rifabutin -rifampin -rifapentine -some medicines to treat HIV infection like atazanavir, indinavir, lopinavir, nelfinavir, tipranavir, ritonavir -St. John's wort -warfarin This list may not describe all possible interactions. Give your health care provider a list of all the medicines, herbs, non-prescription drugs, or dietary supplements you use. Also tell them if you smoke, drink alcohol, or use illegal drugs. Some items may interact with your medicine. What should I watch for while using this medicine? Visit your doctor or health care professional for regular check ups. See your doctor if you or your partner has sexual contact with others, becomes HIV positive, or gets a sexual transmitted disease. This product does not protect you against  HIV infection (AIDS) or other sexually transmitted diseases. You can check the placement of the IUD yourself by reaching up to the top of your  vagina with clean fingers to feel the threads. Do not pull on the threads. It is a good habit to check placement after each menstrual period. Call your doctor right away if you feel more of the IUD than just the threads or if you cannot feel the threads at all. The IUD may come out by itself. You may become pregnant if the device comes out. If you notice that the IUD has come out use a backup birth control method like condoms and call your health care provider. Using tampons will not change the position of the IUD and are okay to use during your period. What side effects may I notice from receiving this medicine? Side effects that you should report to your doctor or health care professional as soon as possible: -allergic reactions like skin rash, itching or hives, swelling of the face, lips, or tongue -fever, flu-like symptoms -genital sores -high blood pressure -no menstrual period for 6 weeks during use -pain, swelling, warmth in the leg -pelvic pain or tenderness -severe or sudden headache -signs of pregnancy -stomach cramping -sudden shortness of breath -trouble with balance, talking, or walking -unusual vaginal bleeding, discharge -yellowing of the eyes or skin Side effects that usually do not require medical attention (report to your doctor or health care professional if they continue or are bothersome): -acne -breast pain -change in sex drive or performance -changes in weight -cramping, dizziness, or faintness while the device is being inserted -headache -irregular menstrual bleeding within first 3 to 6 months of use -nausea This list may not describe all possible side effects. Call your doctor for medical advice about side effects. You may report side effects to FDA at 1-800-FDA-1088. Where should I keep my medicine? This does not apply. NOTE: This sheet is a summary. It may not cover all possible information. If you have questions about this medicine, talk to your doctor,  pharmacist, or health care provider.    2016, Elsevier/Gold Standard. (2011-11-25 13:54:04)

## 2016-07-19 NOTE — Progress Notes (Signed)
   Patient is a 30 year old that presented to the office today for placement of Mirena IUD. Patient is morbidly obese with a weight of 346 pounds and has had issues with heavy periods and bleeding in between cycles. Her history as follows:  She was seen in the office in September 7 complaining of breakthrough bleeding while on the Nexplanon and had the device removed at that office visit.  The note from office visit on September 7 reason as follows: "Patient stated that since this June when she had the Nexplanon placed on her left arm has had breakthrough bleeding some days more heavier some days just light spotting. She was seen for her annual gynecological examination July of this year and did not have any major complaints at that time. She does weigh 346 pounds and states that she lost 52 pounds and is currently weighing 292 pounds. Her blood work July 19 of this year demonstrated a normal comprehensive metabolic panel, lipid profile, and CBC, as well as TSH."  She was placed on Estrace 0.5 mg daily and effort to stabilize her endometrium for 21 days S continue to bleed and wanted to have the Nexplanon removed and have the Mirena IUD placed the following week. She had also been given ibuprofen 600 mg to take 1 by mouth 3 times a day for 5-7 days as well but this has not helped.   The sutures were removed from her arm where the Nexplanon was removed on September 7. She was counseled on the Mirena IUD risk benefits and pros and cons. Literature of previous been provided.                                                                    IUD procedure note       Patient presented to the office today for placement of Mirena IUD. The patient had previously been provided with literature information on this method of contraception. The risks benefits and pros and cons were discussed and all her questions were answered. She is fully aware that this form of contraception is 99% effective and is good for 5  years.  Pelvic exam: Bartholin urethra Skene glands: Within normal limits Vagina: No lesions or discharge Cervix: No lesions or discharge Uterus: Anteverted position Adnexa: No masses or tenderness Rectal exam: Not done  The cervix was cleansed with Betadine solution. A single-tooth tenaculum was placed on the anterior cervical lip. The uterus sounded to 7 centimeter. The IUD was shown to the patient and inserted in a sterile fashion. The IUD string was trimmed. The single-tooth tenaculum was removed. Patient was instructed to return back to the office in one month for follow up.      Because of the cramping patient was experiencing before and during the procedure she was given Toradol 30 mg IM

## 2016-07-19 NOTE — Addendum Note (Signed)
Addended by: Thurnell Garbe A on: 07/19/2016 04:59 PM   Modules accepted: Orders

## 2016-07-20 ENCOUNTER — Encounter: Payer: Self-pay | Admitting: Anesthesiology

## 2016-07-21 NOTE — Telephone Encounter (Signed)
Erroneous

## 2016-08-31 ENCOUNTER — Encounter: Payer: Self-pay | Admitting: Gynecology

## 2016-08-31 ENCOUNTER — Ambulatory Visit (INDEPENDENT_AMBULATORY_CARE_PROVIDER_SITE_OTHER): Payer: BLUE CROSS/BLUE SHIELD | Admitting: Gynecology

## 2016-08-31 VITALS — BP 136/84

## 2016-08-31 DIAGNOSIS — Z30431 Encounter for routine checking of intrauterine contraceptive device: Secondary | ICD-10-CM

## 2016-08-31 DIAGNOSIS — J069 Acute upper respiratory infection, unspecified: Secondary | ICD-10-CM | POA: Diagnosis not present

## 2016-08-31 NOTE — Progress Notes (Signed)
   Patient 30 year old who presented to the office for her 1 month follow-up after placing a Mirena IUD. Patient previously had a Nexplanon on her left arm but continue to complain of breakthrough bleeding and increased appetite she has done well with a Mirena IUD and was asymptomatic today.  Exam: Abdomen: Soft nontender no rebound or guarding Pelvic: Bartholin urethra Skene glands within normal limits Vagina: No lesions or discharge Cervix: IUD string visualized Uterus: Anteverted normal size shape and consistency Adnexa: No palpable mass or tenderness Rectal exam: Not done  Assessment/plan: Patient status post placement Mirena IUD one month ago doing well. Patient otherwise scheduled to return to the office next year for annual exam or when necessary.

## 2017-03-23 ENCOUNTER — Encounter: Payer: Self-pay | Admitting: Gynecology

## 2017-07-25 ENCOUNTER — Encounter: Payer: Self-pay | Admitting: Obstetrics & Gynecology

## 2017-07-25 ENCOUNTER — Ambulatory Visit (INDEPENDENT_AMBULATORY_CARE_PROVIDER_SITE_OTHER): Payer: BLUE CROSS/BLUE SHIELD | Admitting: Obstetrics & Gynecology

## 2017-07-25 VITALS — BP 126/84 | Ht 64.5 in | Wt 302.0 lb

## 2017-07-25 DIAGNOSIS — Z01419 Encounter for gynecological examination (general) (routine) without abnormal findings: Secondary | ICD-10-CM

## 2017-07-25 DIAGNOSIS — Z1151 Encounter for screening for human papillomavirus (HPV): Secondary | ICD-10-CM | POA: Diagnosis not present

## 2017-07-25 DIAGNOSIS — IMO0001 Reserved for inherently not codable concepts without codable children: Secondary | ICD-10-CM

## 2017-07-25 DIAGNOSIS — Z6841 Body Mass Index (BMI) 40.0 and over, adult: Secondary | ICD-10-CM

## 2017-07-25 DIAGNOSIS — Z113 Encounter for screening for infections with a predominantly sexual mode of transmission: Secondary | ICD-10-CM

## 2017-07-25 DIAGNOSIS — Z975 Presence of (intrauterine) contraceptive device: Secondary | ICD-10-CM

## 2017-07-25 DIAGNOSIS — E6609 Other obesity due to excess calories: Secondary | ICD-10-CM

## 2017-07-25 MED ORDER — PHENTERMINE HCL 37.5 MG PO CAPS: 37.5000 mg | ORAL_CAPSULE | ORAL | 2 refills | Status: DC

## 2017-07-25 NOTE — Progress Notes (Signed)
Anita Hodges 08/31/1986 [id number]   History:    31 y.o. G0 Single  RP:  Established patient presenting for annual gyn exam   HPI:  Well with Mirena IUD x 07/2016.  Light periods, no pelvic pain.  Not currently sexually active.  Obesity with BMI at 51.04.  Tearful about lack of success in loosing weight.  Says that she has tried many times, but tends to fall off the diet and gains weight again.  Breasts wnl.  Mictions/BMs wnl.  Past medical history,surgical history, family history and social history were all reviewed and documented in the EPIC chart.  Gynecologic History Patient's last menstrual period was 07/03/2017. Contraception: IUD Last Pap: 2017. Results were: normal Last mammogram: Never.  Obstetric History OB History  Gravida Para Term Preterm AB Living  0 0 0 0 0 0  SAB TAB Ectopic Multiple Live Births  0 0 0 0           ROS: A ROS was performed and pertinent positives and negatives are included in the history.  GENERAL: No fevers or chills. HEENT: No change in vision, no earache, sore throat or sinus congestion. NECK: No pain or stiffness. CARDIOVASCULAR: No chest pain or pressure. No palpitations. PULMONARY: No shortness of breath, cough or wheeze. GASTROINTESTINAL: No abdominal pain, nausea, vomiting or diarrhea, melena or bright red blood per rectum. GENITOURINARY: No urinary frequency, urgency, hesitancy or dysuria. MUSCULOSKELETAL: No joint or muscle pain, no back pain, no recent trauma. DERMATOLOGIC: No rash, no itching, no lesions. ENDOCRINE: No polyuria, polydipsia, no heat or cold intolerance. No recent change in weight. HEMATOLOGICAL: No anemia or easy bruising or bleeding. NEUROLOGIC: No headache, seizures, numbness, tingling or weakness. PSYCHIATRIC: No depression, no loss of interest in normal activity or change in sleep pattern.     Exam:   BP 126/84   Ht 5' 4.5" (1.638 m)   Wt (!) 302 lb (137 kg)   LMP 07/03/2017 Comment: MIRENA   BMI 51.04 kg/m    Body mass index is 51.04 kg/m.  General appearance : Well developed well nourished female. No acute distress HEENT: Eyes: no retinal hemorrhage or exudates,  Neck supple, trachea midline, no carotid bruits, no thyroidmegaly Lungs: Clear to auscultation, no rhonchi or wheezes, or rib retractions  Heart: Regular rate and rhythm, no murmurs or gallops Breast:Examined in sitting and supine position were symmetrical in appearance, no palpable masses or tenderness,  no skin retraction, no nipple inversion, no nipple discharge, no skin discoloration, no axillary or supraclavicular lymphadenopathy Abdomen: no palpable masses or tenderness, no rebound or guarding Extremities: no edema or skin discoloration or tenderness  Pelvic: Vulva normal   Bartholin, Urethra, Skene Glands: Within normal limits             Vagina: No gross lesions or discharge  Cervix: No gross lesions or discharge.  IUD strings visible.  Pap reflex/Gono-Chlam done.  Uterus  AV, normal size, shape and consistency, non-tender and mobile  Adnexa  Without masses or tenderness  Anus and perineum  normal    Assessment/Plan:  31 y.o. female for annual exam   1. Encounter for routine gynecological examination with Papanicolaou smear of cervix Normal gyn exam.  Pap/HPV done.  Breasts wnl.  2. IUD contraception Well tolerated, in good position.  3. Screen for STD (sexually transmitted disease) Recommend condoms -Gono-Chlam on Pap - HIV antibody - RPR - Hepatitis B Surface AntiGEN - Hepatitis C Antibody  4. Class 3 obesity due  to excess calories without serious comorbidity with body mass index (BMI) of 50.0 to 59.9 in adult [facility]) Weight loss counseling done.  Low Calorie/Carb diet like [employer] recommended.  Regular physical activity including Cardio every day and Weight lifting every 2 days discussed.  Start on Phentermine to suppress appetite.  Psychotherapy referral suggested.  F/U in 3 months with  me.  Counseling on above issues >50% x 10 minutes  Princess Bruins MD, 12:51 PM 07/25/2017

## 2017-07-25 NOTE — Addendum Note (Signed)
Addended by: Thurnell Garbe A on: 07/25/2017 03:28 PM   Modules accepted: Orders

## 2017-07-25 NOTE — Patient Instructions (Signed)
1. Encounter for routine gynecological examination with Papanicolaou smear of cervix Normal gyn exam.  Pap/HPV done.  Breasts wnl.  2. IUD contraception Well tolerated, in good position.  3. Screen for STD (sexually transmitted disease) Recommend condoms -Gono-Chlam on Pap - HIV antibody - RPR - Hepatitis B Surface AntiGEN - Hepatitis C Antibody  4. Class 3 obesity due to excess calories without serious comorbidity with body mass index (BMI) of 50.0 to 59.9 in adult ([place]) Weight loss counseling done.  Low Calorie/Carb diet like [employer] recommended.  Regular physical activity including Cardio every day and Weight lifting every 2 days discussed.  Start on Phentermine to suppress appetite.  Psychotherapy referral suggested.  F/U in 3 months with me.   Anita Hodges, it was a pleasure to meet you today!  I will inform you of your results as soon as possible.  Health Maintenance, Female Adopting a healthy lifestyle and getting preventive care can go a long way to promote health and wellness. Talk with your health care provider about what schedule of regular examinations is right for you. This is a good chance for you to check in with your provider about disease prevention and staying healthy. In between checkups, there are plenty of things you can do on your own. Experts have done a lot of research about which lifestyle changes and preventive measures are most likely to keep you healthy. Ask your health care provider for more information. Weight and diet Eat a healthy diet  Be sure to include plenty of vegetables, fruits, low-fat dairy products, and lean protein.  Do not eat a lot of foods high in solid fats, added sugars, or salt.  Get regular exercise. This is one of the most important things you can do for your health. ? Most adults should exercise for at least 150 minutes each week. The exercise should increase your heart rate and make you sweat (moderate-intensity exercise). ? Most  adults should also do strengthening exercises at least twice a week. This is in addition to the moderate-intensity exercise.  Maintain a healthy weight  Body mass index (BMI) is a measurement that can be used to identify possible weight problems. It estimates body fat based on height and weight. Your health care provider can help determine your BMI and help you achieve or maintain a healthy weight.  For females 3 years of age and older: ? A BMI below 18.5 is considered underweight. ? A BMI of 18.5 to 24.9 is normal. ? A BMI of 25 to 29.9 is considered overweight. ? A BMI of 30 and above is considered obese.  Watch levels of cholesterol and blood lipids  You should start having your blood tested for lipids and cholesterol at 32 years of age, then have this test every 5 years.  You may need to have your cholesterol levels checked more often if: ? Your lipid or cholesterol levels are high. ? You are older than 33 years of age. ? You are at high risk for heart disease.  Cancer screening Lung Cancer  Lung cancer screening is recommended for adults 69-4 years old who are at high risk for lung cancer because of a history of smoking.  A yearly low-dose CT scan of the lungs is recommended for people who: ? Currently smoke. ? Have quit within the past 15 years. ? Have at least a 30-pack-year history of smoking. A pack year is smoking an average of one pack of cigarettes a day for 1 year.  Yearly screening should continue until it has been 15 years since you quit.  Yearly screening should stop if you develop a health problem that would prevent you from having lung cancer treatment.  Breast Cancer  Practice breast self-awareness. This means understanding how your breasts normally appear and feel.  It also means doing regular breast self-exams. Let your health care provider know about any changes, no matter how small.  If you are in your 20s or 30s, you should have a clinical breast  exam (CBE) by a health care provider every 1-3 years as part of a regular health exam.  If you are 2 or older, have a CBE every year. Also consider having a breast X-ray (mammogram) every year.  If you have a family history of breast cancer, talk to your health care provider about genetic screening.  If you are at high risk for breast cancer, talk to your health care provider about having an MRI and a mammogram every year.  Breast cancer gene (BRCA) assessment is recommended for women who have family members with BRCA-related cancers. BRCA-related cancers include: ? Breast. ? Ovarian. ? Tubal. ? Peritoneal cancers.  Results of the assessment will determine the need for genetic counseling and BRCA1 and BRCA2 testing.  Cervical Cancer Your health care provider may recommend that you be screened regularly for cancer of the pelvic organs (ovaries, uterus, and vagina). This screening involves a pelvic examination, including checking for microscopic changes to the surface of your cervix (Pap test). You may be encouraged to have this screening done every 3 years, beginning at age 45.  For women ages 2-65, health care providers may recommend pelvic exams and Pap testing every 3 years, or they may recommend the Pap and pelvic exam, combined with testing for human papilloma virus (HPV), every 5 years. Some types of HPV increase your risk of cervical cancer. Testing for HPV may also be done on women of any age with unclear Pap test results.  Other health care providers may not recommend any screening for nonpregnant women who are considered low risk for pelvic cancer and who do not have symptoms. Ask your health care provider if a screening pelvic exam is right for you.  If you have had past treatment for cervical cancer or a condition that could lead to cancer, you need Pap tests and screening for cancer for at least 20 years after your treatment. If Pap tests have been discontinued, your risk factors  (such as having a new sexual partner) need to be reassessed to determine if screening should resume. Some women have medical problems that increase the chance of getting cervical cancer. In these cases, your health care provider may recommend more frequent screening and Pap tests.  Colorectal Cancer  This type of cancer can be detected and often prevented.  Routine colorectal cancer screening usually begins at 32 years of age and continues through 32 years of age.  Your health care provider may recommend screening at an earlier age if you have risk factors for colon cancer.  Your health care provider may also recommend using home test kits to check for hidden blood in the stool.  A small camera at the end of a tube can be used to examine your colon directly (sigmoidoscopy or colonoscopy). This is done to check for the earliest forms of colorectal cancer.  Routine screening usually begins at age 64.  Direct examination of the colon should be repeated every 5-10 years through 32 years of age.  However, you may need to be screened more often if early forms of precancerous polyps or small growths are found.  Skin Cancer  Check your skin from head to toe regularly.  Tell your health care provider about any new moles or changes in moles, especially if there is a change in a mole's shape or color.  Also tell your health care provider if you have a mole that is larger than the size of a pencil eraser.  Always use sunscreen. Apply sunscreen liberally and repeatedly throughout the day.  Protect yourself by wearing long sleeves, pants, a wide-brimmed hat, and sunglasses whenever you are outside.  Heart disease, diabetes, and high blood pressure  High blood pressure causes heart disease and increases the risk of stroke. High blood pressure is more likely to develop in: ? People who have blood pressure in the high end of the normal range (130-139/85-89 mm Hg). ? People who are overweight or  obese. ? People who are African American.  If you are 70-4 years of age, have your blood pressure checked every 3-5 years. If you are 66 years of age or older, have your blood pressure checked every year. You should have your blood pressure measured twice-once when you are at a hospital or clinic, and once when you are not at a hospital or clinic. Record the average of the two measurements. To check your blood pressure when you are not at a hospital or clinic, you can use: ? An automated blood pressure machine at a pharmacy. ? A home blood pressure monitor.  If you are between 71 years and 3 years old, ask your health care provider if you should take aspirin to prevent strokes.  Have regular diabetes screenings. This involves taking a blood sample to check your fasting blood sugar level. ? If you are at a normal weight and have a low risk for diabetes, have this test once every three years after 32 years of age. ? If you are overweight and have a high risk for diabetes, consider being tested at a younger age or more often. Preventing infection Hepatitis B  If you have a higher risk for hepatitis B, you should be screened for this virus. You are considered at high risk for hepatitis B if: ? You were born in a country where hepatitis B is common. Ask your health care provider which countries are considered high risk. ? Your parents were born in a high-risk country, and you have not been immunized against hepatitis B (hepatitis B vaccine). ? You have HIV or AIDS. ? You use needles to inject street drugs. ? You live with someone who has hepatitis B. ? You have had sex with someone who has hepatitis B. ? You get hemodialysis treatment. ? You take certain medicines for conditions, including cancer, organ transplantation, and autoimmune conditions.  Hepatitis C  Blood testing is recommended for: ? Everyone born from [birth year] through 1966. ? Anyone with known risk factors for hepatitis  C.  Sexually transmitted infections (STIs)  You should be screened for sexually transmitted infections (STIs) including gonorrhea and chlamydia if: ? You are sexually active and are younger than 32 years of age. ? You are older than 31 years of age and your health care provider tells you that you are at risk for this type of infection. ? Your sexual activity has changed since you were last screened and you are at an increased risk for chlamydia or gonorrhea. Ask your health care provider if  you are at risk.  If you do not have HIV, but are at risk, it may be recommended that you take a prescription medicine daily to prevent HIV infection. This is called pre-exposure prophylaxis (PrEP). You are considered at risk if: ? You are sexually active and do not regularly use condoms or know the HIV status of your partner(s). ? You take drugs by injection. ? You are sexually active with a partner who has HIV.  Talk with your health care provider about whether you are at high risk of being infected with HIV. If you choose to begin PrEP, you should first be tested for HIV. You should then be tested every 3 months for as long as you are taking PrEP. Pregnancy  If you are premenopausal and you may become pregnant, ask your health care provider about preconception counseling.  If you may become pregnant, take 400 to 800 micrograms (mcg) of folic acid every day.  If you want to prevent pregnancy, talk to your health care provider about birth control (contraception). Osteoporosis and menopause  Osteoporosis is a disease in which the bones lose minerals and strength with aging. This can result in serious bone fractures. Your risk for osteoporosis can be identified using a bone density scan.  If you are 63 years of age or older, or if you are at risk for osteoporosis and fractures, ask your health care provider if you should be screened.  Ask your health care provider whether you should take a calcium or  vitamin D supplement to lower your risk for osteoporosis.  Menopause may have certain physical symptoms and risks.  Hormone replacement therapy may reduce some of these symptoms and risks. Talk to your health care provider about whether hormone replacement therapy is right for you. Follow these instructions at home:  Schedule regular health, dental, and eye exams.  Stay current with your immunizations.  Do not use any tobacco products including cigarettes, chewing tobacco, or electronic cigarettes.  If you are pregnant, do not drink alcohol.  If you are breastfeeding, limit how much and how often you drink alcohol.  Limit alcohol intake to no more than 1 drink per day for nonpregnant women. One drink equals 12 ounces of beer, 5 ounces of wine, or 1 ounces of hard liquor.  Do not use street drugs.  Do not share needles.  Ask your health care provider for help if you need support or information about quitting drugs.  Tell your health care provider if you often feel depressed.  Tell your health care provider if you have ever been abused or do not feel safe at home. This information is not intended to replace advice given to you by your health care provider. Make sure you discuss any questions you have with your health care provider. Document Released: 05/10/2011 Document Revised: 04/01/2016 Document Reviewed: 07/29/2015 Elsevier Interactive Patient Education  [employer].

## 2017-07-26 ENCOUNTER — Encounter: Payer: Self-pay | Admitting: *Deleted

## 2017-07-26 LAB — RPR: RPR Ser Ql: NONREACTIVE

## 2017-07-26 LAB — HEPATITIS C ANTIBODY
Hepatitis C Ab: NONREACTIVE
SIGNAL TO CUT-OFF: 0.02 (ref <1.00)

## 2017-07-26 LAB — HEPATITIS B SURFACE ANTIGEN: Hepatitis B Surface Ag: NONREACTIVE

## 2017-07-26 LAB — HIV ANTIBODY (ROUTINE TESTING W REFLEX): HIV 1&2 Ab, 4th Generation: NONREACTIVE

## 2017-07-27 LAB — PAP IG, CT-NG NAA, HPV HIGH-RISK
C. trachomatis RNA, TMA: NOT DETECTED
HPV DNA High Risk: NOT DETECTED
N. gonorrhoeae RNA, TMA: NOT DETECTED

## 2017-07-29 ENCOUNTER — Encounter: Payer: Self-pay | Admitting: *Deleted

## 2017-08-09 DIAGNOSIS — Z23 Encounter for immunization: Secondary | ICD-10-CM | POA: Diagnosis not present

## 2017-08-16 ENCOUNTER — Telehealth: Payer: Self-pay | Admitting: *Deleted

## 2017-08-16 NOTE — Telephone Encounter (Signed)
Patient was prescribed phentermin 37.5 mg at [place] in 07/25/17, patient has noticed increased shortness of breath, heart flutter, and just not feeling well. Pt said she had heart flutters for a couple of days when starting but not the SOB. I told pt to stop medication. She wanted to know if you aware of any weight lost medication? Patient said a her friend has done well with Qsymia. Please advise

## 2017-08-17 ENCOUNTER — Encounter: Payer: Self-pay | Admitting: Obstetrics & Gynecology

## 2017-08-21 NOTE — Telephone Encounter (Signed)
Needs to be evaluated now by a Family MD or Internist for SOB and palpitations.  I will not prescribe her any other medication without a visit with me for evaluation.

## 2017-08-22 NOTE — Telephone Encounter (Signed)
Pt was informed via my chart on 08/17/17 to seek care due to symptoms. Spoke with patient today and symptoms have stopped. Will scheduled OV.

## 2017-09-21 DIAGNOSIS — H9203 Otalgia, bilateral: Secondary | ICD-10-CM | POA: Diagnosis not present

## 2017-09-21 DIAGNOSIS — J019 Acute sinusitis, unspecified: Secondary | ICD-10-CM | POA: Diagnosis not present

## 2017-09-21 DIAGNOSIS — R05 Cough: Secondary | ICD-10-CM | POA: Diagnosis not present

## 2017-10-11 ENCOUNTER — Encounter: Payer: Self-pay | Admitting: Obstetrics & Gynecology

## 2017-10-11 ENCOUNTER — Ambulatory Visit: Payer: BLUE CROSS/BLUE SHIELD | Admitting: Obstetrics & Gynecology

## 2017-10-11 VITALS — BP 126/80 | Ht 64.0 in | Wt 317.0 lb

## 2017-10-11 DIAGNOSIS — R102 Pelvic and perineal pain: Secondary | ICD-10-CM | POA: Diagnosis not present

## 2017-10-11 DIAGNOSIS — Z6841 Body Mass Index (BMI) 40.0 and over, adult: Secondary | ICD-10-CM | POA: Diagnosis not present

## 2017-10-11 DIAGNOSIS — Z113 Encounter for screening for infections with a predominantly sexual mode of transmission: Secondary | ICD-10-CM

## 2017-10-11 MED ORDER — LORCASERIN HCL 10 MG PO TABS: 1.0000 | ORAL_TABLET | Freq: Two times a day (BID) | ORAL | 5 refills | Status: DC

## 2017-10-11 NOTE — Progress Notes (Signed)
    Anita Hodges 1986/03/29 [id number]        31 y.o.  G0 New boyfriend  RP:  Weight loss management/Vaginal pain  HPI:  Gained weight x last visit.  Didn't tolerate Phentermine, stopped after about 2 wks.  Had lost 17 Lbs then, but gained back and more.  Since last visit 9/17th, gained 15 Lbs.  As patient puts it: "My eating has been out of control".  Patient also complains of vaginal pain, wonders if it could be associated to her IUD.  No abnormal vaginal discharge.  No itching and no odor.  Urine normal.  Bowel movements normal.  No fever.  Past medical history,surgical history, problem list, medications, allergies, family history and social history were all reviewed and documented in the EPIC chart.  Directed ROS with pertinent positives and negatives documented in the history of present illness/assessment and plan.  Exam:  Vitals:   10/11/17 1617  BP: 126/80  Weight: (!) 317 lb (143.8 kg)  Height: 5\' 4"  (1.626 m)   General appearance:  Normal  Gyn exam:  Vulva normal.  Speculum:  Cervix normal.  IUD strings well seen.  Gono-Chlam done.  Vagina normal.   Assessment/Plan:  31 y.o. G0P0000   1. Class 3 severe obesity due to excess calories without serious comorbidity with body mass index (BMI) of 50.0 to 59.9 in adult [facility]) Phentermine intolerance.  Start Belviq 10 mg BID.  [employer].  Physical activity including aerobics and weight lifting recommended.  Coaching, counseling and encouragements given.  2. Vaginal pain Normal gyn exam.  Mirena IUD in good location.  No sign of infection, but Gono-Chlam pending.  3. Screen for STD (sexually transmitted disease) Condom use recommended - C. trachomatis/N. gonorrhoeae RNA  Counseling on above issues more than 50% for 25 minutes.  Princess Bruins MD, 4:39 PM 10/11/2017

## 2017-10-12 ENCOUNTER — Encounter: Payer: Self-pay | Admitting: *Deleted

## 2017-10-12 ENCOUNTER — Telehealth: Payer: Self-pay | Admitting: *Deleted

## 2017-10-12 LAB — C. TRACHOMATIS/N. GONORRHOEAE RNA
C. trachomatis RNA, TMA: NOT DETECTED
N. gonorrhoeae RNA, TMA: NOT DETECTED

## 2017-10-12 NOTE — Progress Notes (Signed)
Rx that was prescribed yesterday Lorcaserin was called in this morning to [place], [place]

## 2017-10-12 NOTE — Telephone Encounter (Signed)
Prior authorization for Belviq 10 mg tablets done via cover my meds, will wait for response for [facility]

## 2017-10-14 NOTE — Telephone Encounter (Signed)
Belviq 10 mg approved from 10/12/17-04/09/18

## 2017-10-17 ENCOUNTER — Encounter: Payer: Self-pay | Admitting: Obstetrics & Gynecology

## 2017-10-17 NOTE — Patient Instructions (Signed)
1. Class 3 severe obesity due to excess calories without serious comorbidity with body mass index (BMI) of 50.0 to 59.9 in adult [facility]) Phentermine intolerance.  Start Belviq 10 mg BID.  [employer].  Physical activity including aerobics and weight lifting recommended.  Coaching, counseling and encouragements given.  2. Vaginal pain Normal gyn exam.  Mirena IUD in good location.  No sign of infection, but Gono-Chlam pending.  3. Screen for STD (sexually transmitted disease) Condom use recommended - C. trachomatis/N. gonorrhoeae RNA  Estill Bamberg, good seeing you today!  Please call me at any time if you have problems with our plan.  Lorcaserin oral tablets What is this medicine? LORCASERIN (lor ca SER in) is used to promote and maintain weight loss in obese patients. This medicine should be used with a reduced calorie diet and, if appropriate, an exercise program. This medicine may be used for other purposes; ask your health care provider or pharmacist if you have questions. COMMON BRAND NAME(S): Belviq What should I tell my health care provider before I take this medicine? They need to know if you have any of these conditions: -anatomical deformation of the penis, Peyronie's disease, or history of priapism (painful and prolonged erection) -diabetes -heart disease -history of blood diseases, like sickle cell anemia or leukemia -history of irregular heartbeat -kidney disease -liver disease -suicidal thoughts, plans, or attempt; a previous suicide attempt by you or a family member -an unusual or allergic reaction to lorcaserin, other medicines, foods, dyes, or preservatives -pregnant or trying to get pregnant -breast-feeding How should I use this medicine? Take this medicine by mouth with a glass of water. Follow the directions on the prescription label. You can take it with or without food. Take your medicine at regular intervals. Do not take it more often than directed. Do not  stop taking except on your doctor's advice. Talk to your pediatrician regarding the use of this medicine in children. Special care may be needed. Overdosage: If you think you have taken too much of this medicine contact a poison control center or emergency room at once. NOTE: This medicine is only for you. Do not share this medicine with others. What if I miss a dose? If you miss a dose, take it as soon as you can. If it is almost time for your next dose, take only that dose. Do not take double or extra doses. What may interact with this medicine? -cabergoline -certain medicines for depression, anxiety, or psychotic disturbances -certain medicines for erectile dysfunction -certain medicines for migraine headache like almotriptan, eletriptan, frovatriptan, naratriptan, rizatriptan, sumatriptan, zolmitriptan -dextromethorphan -linezolid -lithium -medicines for diabetes -other weight loss products -tramadol -St. John's Wort -stimulant medicines for attention disorders, weight loss, or to stay awake -tryptophan This list may not describe all possible interactions. Give your health care provider a list of all the medicines, herbs, non-prescription drugs, or dietary supplements you use. Also tell them if you smoke, drink alcohol, or use illegal drugs. Some items may interact with your medicine. What should I watch for while using this medicine? This medicine is intended to be used in addition to a healthy diet and appropriate exercise. The best results are achieved this way. Your doctor should instruct you to stop taking this medicine if you do not lose a certain amount of weight within the first 12 weeks of treatment, but it is important that you do not change your dose in any way without consulting your doctor or health care professional. Visit  your doctor or health care professional for regular checkups. Your doctor may order blood tests or other tests to see how you are doing. Do not drive, use  machinery, or do anything that needs mental alertness until you know how this medicine affects you. This medicine may affect blood sugar levels. If you have diabetes, check with your doctor or health care professional before you change your diet or the dose of your diabetic medicine. Patients and their families should watch out for worsening depression or thoughts of suicide. Also watch out for sudden changes in feelings such as feeling anxious, agitated, panicky, irritable, hostile, aggressive, impulsive, severely restless, overly excited and hyperactive, or not being able to sleep. If this happens, especially at the beginning of treatment or after a change in dose, call your health care professional. Contact your doctor or health care professional right away if you are a man with an erection that lasts longer than 4 hours or if the erection becomes painful. This may be a sign of serious problem and must be treated right away to prevent permanent damage. What side effects may I notice from receiving this medicine? Side effects that you should report to your doctor or health care professional as soon as possible: -allergic reactions like skin rash, itching or hives, swelling of the face, lips, or tongue -abnormal production of milk -breast enlargement in both males and females -breathing problems -changes in emotions or moods -changes in vision -confusion -erection lasting more than 4 hours or a painful erection -fast or irregular heart beat -feeling faint or lightheaded, falls -fever or chills, sore throat -hallucination, loss of contact with reality -high or low blood pressure -menstrual changes -restlessness -slow or irregular heartbeat -stiff muscles -sweating -suicidal thoughts or other mood changes -swelling of the ankles, feet, hands -unusually weak or tired -vomiting Side effects that usually do not require medical attention (report to your doctor or health care professional if  they continue or are bothersome): -back pain -constipation -cough -dry mouth -nausea -tiredness This list may not describe all possible side effects. Call your doctor for medical advice about side effects. You may report side effects to FDA at 1-800-FDA-1088. Where should I keep my medicine? Keep out of the reach of children. This medicine can be abused. Keep your medicine in a safe place to protect it from theft. Do not share this medicine with anyone. Selling or giving away this medicine is dangerous and against the law. Store at room temperature between 15 and 30 degrees C (59 and 86 degrees F). Throw away any unused medicine after the expiration date. NOTE: This sheet is a summary. It may not cover all possible information. If you have questions about this medicine, talk to your doctor, pharmacist, or health care provider.  2018 Elsevier/Gold Standard (2015-11-27 12:13:31)

## 2017-10-24 ENCOUNTER — Ambulatory Visit: Payer: BLUE CROSS/BLUE SHIELD | Admitting: Obstetrics & Gynecology

## 2017-12-02 ENCOUNTER — Encounter: Payer: Self-pay | Admitting: Obstetrics & Gynecology

## 2017-12-10 DIAGNOSIS — L03115 Cellulitis of right lower limb: Secondary | ICD-10-CM | POA: Diagnosis not present

## 2017-12-10 DIAGNOSIS — S71101A Unspecified open wound, right thigh, initial encounter: Secondary | ICD-10-CM | POA: Diagnosis not present

## 2017-12-27 ENCOUNTER — Ambulatory Visit: Payer: BLUE CROSS/BLUE SHIELD | Admitting: Urgent Care

## 2017-12-29 ENCOUNTER — Ambulatory Visit (HOSPITAL_COMMUNITY)
Admission: RE | Admit: 2017-12-29 | Discharge: 2017-12-29 | Disposition: A | Discharge status: Home / Self Care | Payer: BLUE CROSS/BLUE SHIELD | Source: Ambulatory Visit | Attending: Urgent Care | Admitting: Urgent Care

## 2017-12-29 ENCOUNTER — Ambulatory Visit: Payer: BLUE CROSS/BLUE SHIELD | Admitting: Urgent Care

## 2017-12-29 ENCOUNTER — Encounter: Payer: Self-pay | Admitting: Urgent Care

## 2017-12-29 VITALS — BP 132/87 | HR 86 | Temp 97.9°F | Resp 18 | Ht 61.0 in | Wt 334.2 lb

## 2017-12-29 DIAGNOSIS — R112 Nausea with vomiting, unspecified: Secondary | ICD-10-CM | POA: Diagnosis not present

## 2017-12-29 DIAGNOSIS — R1011 Right upper quadrant pain: Secondary | ICD-10-CM | POA: Insufficient documentation

## 2017-12-29 DIAGNOSIS — R82998 Other abnormal findings in urine: Secondary | ICD-10-CM | POA: Diagnosis not present

## 2017-12-29 DIAGNOSIS — R1084 Generalized abdominal pain: Secondary | ICD-10-CM

## 2017-12-29 LAB — POCT CBC
Granulocyte percent: 70.4 % (ref 37–80)
HCT, POC: 40.4 % (ref 37.7–47.9)
Hemoglobin: 13.4 g/dL (ref 12.2–16.2)
Lymph, poc: 2.2 (ref 0.6–3.4)
MCH, POC: 28.1 pg (ref 27–31.2)
MCHC: 33.2 g/dL (ref 31.8–35.4)
MCV: 84.7 fL (ref 80–97)
MID (cbc): 0.3 (ref 0–0.9)
MPV: 7.2 fL (ref 0–99.8)
POC Granulocyte: 5.8 (ref 2–6.9)
POC LYMPH PERCENT: 26.1 % (ref 10–50)
POC MID %: 3.5 % (ref 0–12)
Platelet Count, POC: 377 K/uL (ref 142–424)
RBC: 4.77 M/uL (ref 4.04–5.48)
RDW, POC: 13.2 %
WBC: 8.3 K/uL (ref 4.6–10.2)

## 2017-12-29 LAB — POC MICROSCOPIC URINALYSIS (UMFC): Mucus: ABSENT

## 2017-12-29 LAB — POCT URINALYSIS DIP (MANUAL ENTRY)
Bilirubin, UA: NEGATIVE
Glucose, UA: NEGATIVE mg/dL
Ketones, POC UA: NEGATIVE mg/dL
Nitrite, UA: NEGATIVE
Protein Ur, POC: NEGATIVE mg/dL
Spec Grav, UA: 1.02 (ref 1.010–1.025)
Urobilinogen, UA: 0.2 E.U./dL
pH, UA: 6 (ref 5.0–8.0)

## 2017-12-29 LAB — POCT URINE PREGNANCY: Preg Test, Ur: NEGATIVE

## 2017-12-29 MED ORDER — ONDANSETRON 4 MG PO TBDP: 8.0000 mg | ORAL_TABLET | Freq: Once | ORAL | Status: DC

## 2017-12-29 NOTE — Patient Instructions (Addendum)
Please proceed to Anita Hodges for your ultrasound exam today. It is scheduled for an 11:15 check in.   Anita [facility]ges is located at [address], [place], [place] [address].     Abdominal Pain, Adult Many things can cause belly (abdominal) pain. Most times, belly pain is not dangerous. Many cases of belly pain can be watched and treated at home. Sometimes belly pain is serious, though. Your doctor will try to find the cause of your belly pain. Follow these instructions at home:  Take over-the-counter and prescription medicines only as told by your doctor. Do not take medicines that help you poop (laxatives) unless told to by your doctor.  Drink enough fluid to keep your pee (urine) clear or pale yellow.  Watch your belly pain for any changes.  Keep all follow-up visits as told by your doctor. This is important. Contact a doctor if:  Your belly pain changes or gets worse.  You are not hungry, or you lose weight without trying.  You are having trouble pooping (constipated) or have watery poop (diarrhea) for more than 2-3 days.  You have pain when you pee or poop.  Your belly pain wakes you up at night.  Your pain gets worse with meals, after eating, or with certain foods.  You are throwing up and cannot keep anything down.  You have a fever. Get help right away if:  Your pain does not go away as soon as your doctor says it should.  You cannot stop throwing up.  Your pain is only in areas of your belly, such as the right side or the left lower part of the belly.  You have bloody or black poop, or poop that looks like tar.  You have very bad pain, cramping, or bloating in your belly.  You have signs of not having enough fluid or water in your body (dehydration), such as: ? Dark pee, very little pee, or no pee. ? Cracked lips. ? Dry mouth. ? Sunken eyes. ? Sleepiness. ? Weakness. This information is not intended to replace advice given to you by your health care provider.  Make sure you discuss any questions you have with your health care provider. Document Released: 04/12/2008 Document Revised: 05/14/2016 Document Reviewed: 04/07/2016 Elsevier Interactive Patient Education  2018 [employer].     IF you received an x-ray today, you will receive an invoice from [facility]. Please contact [facility] at [phone] with questions or concerns regarding your invoice.   IF you received labwork today, you will receive an invoice from [facility]. Please contact [facility] at [phone] with questions or concerns regarding your invoice.   Our billing staff will not be able to assist you with questions regarding bills from these companies.  You will be contacted with the lab results as soon as they are available. The fastest way to get your results is to activate your My Chart account. Instructions are located on the last page of this paperwork. If you have not heard from [place] regarding the results in 2 weeks, please contact this office.

## 2017-12-29 NOTE — Progress Notes (Addendum)
MRN: [id number] DOB: 15-Nov-1985  Subjective:   Anita Hodges is a 32 y.o. female presenting for 2 day history of RUQ pain, right flank pain, radiates to left upper abdominal pain. Episodes started after eating a meal from [facility]. Had nausea with vomiting (2 episodes). Has not tried any medications for relief. Has decreased appetite. Patient is tolerating fluids. Denies smoking cigarettes. No alcohol use.   Review of Systems  Constitutional: Negative for fever.  Cardiovascular: Negative for chest pain.  Gastrointestinal: Negative for blood in stool, constipation and diarrhea.  Genitourinary: Negative for dysuria and hematuria.    Jaionna currently has no medications in their medication list. Also is allergic to codeine.  Anita Hodges  has a past medical history of Allergy, Anxiety, Hypertension, and Obesity. Also  has a past surgical history that includes Wisdom tooth extraction and Tonsillectomy.  Objective:   Vitals: BP 132/87   Pulse 86   Temp 97.9 F (36.6 C) (Oral)   Resp 18   Ht 5\' 1"  (1.549 m)   Wt (!) 334 lb 3.2 oz (151.6 kg)   SpO2 99%   BMI 63.15 kg/m   Physical Exam  Constitutional: She is oriented to person, place, and time. She appears well-developed and well-nourished.  HENT:  Mouth/Throat: Oropharynx is clear and moist.  Cardiovascular: Normal rate, regular rhythm and intact distal pulses. Exam reveals no gallop and no friction rub.  No murmur heard. Pulmonary/Chest: No respiratory distress. She has no wheezes. She has no rales.  Abdominal: There is tenderness (positive Murphy sign) in the right upper quadrant. There is CVA tenderness (right sided).  Neurological: She is alert and oriented to person, place, and time.  Skin: Skin is warm and dry.   Results for orders placed or performed in visit on 12/29/17 (from the past 24 hour(s))  POCT urine pregnancy     Status: None   Collection Time: 12/29/17  8:48 AM  Result Value Ref Range   Preg Test, Ur Negative  Negative  POCT urinalysis dipstick     Status: Abnormal   Collection Time: 12/29/17  8:48 AM  Result Value Ref Range   Color, UA yellow yellow   Clarity, UA clear clear   Glucose, UA negative negative mg/dL   Bilirubin, UA negative negative   Ketones, POC UA negative negative mg/dL   Spec Grav, UA 1.020 1.010 - 1.025   Blood, UA trace-intact (A) negative   pH, UA 6.0 5.0 - 8.0   Protein Ur, POC negative negative mg/dL   Urobilinogen, UA 0.2 0.2 or 1.0 E.U./dL   Nitrite, UA Negative Negative   Leukocytes, UA Large (3+) (A) Negative  POCT Microscopic Urinalysis ([facility])     Status: Abnormal   Collection Time: 12/29/17  9:02 AM  Result Value Ref Range   WBC,UR,HPF,POC Moderate (A) None WBC/hpf   RBC,UR,HPF,POC None None RBC/hpf   Bacteria None None, Too numerous to count   Mucus Absent Absent   Epithelial Cells, UR Per Microscopy Moderate (A) None, Too numerous to count cells/hpf  POCT CBC     Status: None   Collection Time: 12/29/17  9:58 AM  Result Value Ref Range   WBC 8.3 4.6 - 10.2 K/uL   Lymph, poc 2.2 0.6 - 3.4   POC LYMPH PERCENT 26.1 10 - 50 %L   MID (cbc) 0.3 0 - 0.9   POC MID % 3.5 0 - 12 %M   POC Granulocyte 5.8 2 - 6.9   Granulocyte percent 70.4 37 -  80 %G   RBC 4.77 4.04 - 5.48 M/uL   Hemoglobin 13.4 12.2 - 16.2 g/dL   HCT, POC 40.4 37.7 - 47.9 %   MCV 84.7 80 - 97 fL   MCH, POC 28.1 27 - 31.2 pg   MCHC 33.2 31.8 - 35.4 g/dL   RDW, POC 13.2 %   Platelet Count, POC 377 142 - 424 K/uL   MPV 7.2 0 - 99.8 fL   Assessment and Plan :   Right upper quadrant abdominal pain - Plan: POCT urine pregnancy, POCT urinalysis dipstick, POCT Microscopic Urinalysis ([facility]), POCT CBC, Comprehensive metabolic panel, US Abdomen Limited RUQ, CANCELED: US Abdomen Limited RUQ  Generalized abdominal pain - Plan: POCT CBC, Comprehensive metabolic panel, Lipase, CANCELED: DG Abd 2 Views  Leukocytes in urine - Plan: Urine Culture  Nausea and vomiting, intractability of vomiting not  specified, unspecified vomiting type - Plan: ondansetron (ZOFRAN-ODT) disintegrating tablet 8 mg  Will pursue RUQ U/S to rule out gallbladder disease. Labs pending. Return-to-clinic precautions discussed, patient verbalized understanding.   Anita Eagles, PA-C [facility] [phone] 12/29/2017  9:09 AM    UPDATE:  US Abdomen Limited Ruq  Result Date: 12/29/2017 CLINICAL DATA:  Right upper quadrant pain.  Nausea for 6 days. EXAM: ULTRASOUND ABDOMEN LIMITED RIGHT UPPER QUADRANT COMPARISON:  Abdominal CT 01/21/2015 FINDINGS: Gallbladder: Under distended gallbladder which accounts for wall thickening. Negative for calculus. Reported gallbladder tenderness. No evidence of pericholecystic fluid. Common bile duct: Diameter: 3 mm Liver: No focal lesion identified. Within normal limits in parenchymal echogenicity. Portal vein is patent on color Doppler imaging with normal direction of blood flow towards the liver. IMPRESSION: 1. Although the gallbladder is tender it is contracted and without visible stone. 2. No acute finding. Electronically Signed   By: Monte Fantasia M.D.   On: 12/29/2017 17:28    Discussed U/S results with patient, will rx Zofran. Recommended patient have low fat, fiber diet. Hydrate well. Will look to refer to [facility] for consult pending labs. Return-to-clinic precautions discussed, patient verbalized understanding.

## 2017-12-30 LAB — COMPREHENSIVE METABOLIC PANEL
ALT: 16 IU/L (ref 0–32)
AST: 12 IU/L (ref 0–40)
Albumin/Globulin Ratio: 1.6 (ref 1.2–2.2)
Albumin: 3.9 g/dL (ref 3.5–5.5)
Alkaline Phosphatase: 82 IU/L (ref 39–117)
BUN/Creatinine Ratio: 14 (ref 9–23)
BUN: 10 mg/dL (ref 6–20)
Bilirubin Total: 0.3 mg/dL (ref 0.0–1.2)
CO2: 23 mmol/L (ref 20–29)
Calcium: 8.9 mg/dL (ref 8.7–10.2)
Chloride: 103 mmol/L (ref 96–106)
Creatinine, Ser: 0.71 mg/dL (ref 0.57–1.00)
GFR calc Af Amer: 130 mL/min/{1.73_m2} (ref >59)
GFR calc non Af Amer: 113 mL/min/{1.73_m2} (ref >59)
Globulin, Total: 2.4 g/dL (ref 1.5–4.5)
Glucose: 91 mg/dL (ref 65–99)
Potassium: 4.6 mmol/L (ref 3.5–5.2)
Sodium: 138 mmol/L (ref 134–144)
Total Protein: 6.3 g/dL (ref 6.0–8.5)

## 2017-12-30 LAB — URINE CULTURE

## 2017-12-30 LAB — LIPASE: Lipase: 35 U/L (ref 14–72)

## 2018-01-03 ENCOUNTER — Other Ambulatory Visit: Payer: Self-pay | Admitting: Urgent Care

## 2018-01-03 DIAGNOSIS — R1011 Right upper quadrant pain: Secondary | ICD-10-CM

## 2018-01-03 DIAGNOSIS — R935 Abnormal findings on diagnostic imaging of other abdominal regions, including retroperitoneum: Secondary | ICD-10-CM

## 2018-01-11 ENCOUNTER — Ambulatory Visit (INDEPENDENT_AMBULATORY_CARE_PROVIDER_SITE_OTHER): Payer: BLUE CROSS/BLUE SHIELD | Admitting: Licensed Clinical Social Worker

## 2018-01-11 DIAGNOSIS — F321 Major depressive disorder, single episode, moderate: Secondary | ICD-10-CM

## 2018-01-12 ENCOUNTER — Telehealth: Payer: Self-pay | Admitting: *Deleted

## 2018-01-12 NOTE — Telephone Encounter (Signed)
Marya Amsler (therapist) called from [facility] regarding referral for this patient, she saw patient yesterday in office. [name] asked if you would call her to discuss at [phone].

## 2018-01-13 NOTE — Telephone Encounter (Signed)
Patient's assessment discussed with Anita Hodges Psychotherapist.  Please schedule patient with me as soon as possible to assess for Major Depression and treatment.

## 2018-01-13 NOTE — Telephone Encounter (Signed)
Pt informed, transferred to front desk

## 2018-01-18 ENCOUNTER — Other Ambulatory Visit: Payer: Self-pay | Admitting: Allergy

## 2018-01-18 ENCOUNTER — Ambulatory Visit
Admission: RE | Admit: 2018-01-18 | Discharge: 2018-01-18 | Disposition: A | Discharge status: Home / Self Care | Payer: BLUE CROSS/BLUE SHIELD | Source: Ambulatory Visit | Attending: Allergy | Admitting: Allergy

## 2018-01-18 DIAGNOSIS — J3089 Other allergic rhinitis: Secondary | ICD-10-CM | POA: Diagnosis not present

## 2018-01-18 DIAGNOSIS — R05 Cough: Secondary | ICD-10-CM | POA: Diagnosis not present

## 2018-01-18 DIAGNOSIS — R059 Cough, unspecified: Secondary | ICD-10-CM

## 2018-01-18 DIAGNOSIS — J309 Allergic rhinitis, unspecified: Secondary | ICD-10-CM | POA: Diagnosis not present

## 2018-01-18 DIAGNOSIS — J301 Allergic rhinitis due to pollen: Secondary | ICD-10-CM | POA: Diagnosis not present

## 2018-01-18 DIAGNOSIS — J3081 Allergic rhinitis due to animal (cat) (dog) hair and dander: Secondary | ICD-10-CM | POA: Diagnosis not present

## 2018-01-24 ENCOUNTER — Encounter: Payer: Self-pay | Admitting: Obstetrics & Gynecology

## 2018-01-24 ENCOUNTER — Ambulatory Visit: Payer: BLUE CROSS/BLUE SHIELD | Admitting: Obstetrics & Gynecology

## 2018-01-24 ENCOUNTER — Telehealth: Payer: Self-pay | Admitting: *Deleted

## 2018-01-24 ENCOUNTER — Ambulatory Visit (INDEPENDENT_AMBULATORY_CARE_PROVIDER_SITE_OTHER): Payer: BLUE CROSS/BLUE SHIELD | Admitting: Licensed Clinical Social Worker

## 2018-01-24 VITALS — BP 132/80 | Wt 339.0 lb

## 2018-01-24 DIAGNOSIS — F418 Other specified anxiety disorders: Secondary | ICD-10-CM

## 2018-01-24 DIAGNOSIS — F321 Major depressive disorder, single episode, moderate: Secondary | ICD-10-CM | POA: Diagnosis not present

## 2018-01-24 DIAGNOSIS — Z6841 Body Mass Index (BMI) 40.0 and over, adult: Secondary | ICD-10-CM

## 2018-01-24 DIAGNOSIS — J301 Allergic rhinitis due to pollen: Secondary | ICD-10-CM | POA: Diagnosis not present

## 2018-01-24 MED ORDER — SERTRALINE HCL 100 MG PO TABS: 100.0000 mg | ORAL_TABLET | Freq: Every day | ORAL | 5 refills | Status: DC

## 2018-01-24 NOTE — Telephone Encounter (Signed)
-----   Message from Princess Bruins, MD sent at 01/24/2018 11:33 AM EDT ----- Regarding: TSH level ordered Please inform patient that a TSH level was ordered for Depression and progressive obesity.  I sent her an e-mail about it, but not sure it will reach her.Marland KitchenMarland Kitchen

## 2018-01-24 NOTE — Progress Notes (Signed)
    Anita Hodges May 07, 1986 [id number]        32 y.o.  G0 Single  RP: Depression and anxiety  HPI:  Psychotherapy with Marya Amsler currently.  Patient is crying very often.  Decreased socialization and anxiety. Difficulty sleeping and feeling tired.  Depressed over her obesity issues, low self esteem and embarrassment.  No suicidal ideation.     OB History  Gravida Para Term Preterm AB Living  0 0 0 0 0 0  SAB TAB Ectopic Multiple Live Births  0 0 0 0          Past medical history,surgical history, problem list, medications, allergies, family history and social history were all reviewed and documented in the EPIC chart.   Directed ROS with pertinent positives and negatives documented in the history of present illness/assessment and plan.  Exam:  Vitals:   01/24/18 0955  BP: 132/80  Weight: (!) 339 lb (153.8 kg)   General appearance:  Normal  Deferred Gyn exam   Assessment/Plan:  32 y.o. G0P0000   1. Depression with anxiety Worsening anxiety and Depressive symptoms reaching major depression.  No suicidal ideation.  In psychotherapy.  Will start on Zoloft 100 mg PO daily (50 mg daily x 3 first days).  Usage, side effects and risks, benefits discussed thoroughly.  Patient informed that the antidepressant effect is likely to take 2 weeks before starting.    2. Class 3 severe obesity due to excess calories without serious comorbidity with body mass index (BMI) of 60.0 to 69.9 in adult [facility]) Offered referral to a [place], but not covered by insurance/too expensive.  Will continue on a low Calorie/Carb diet with increased physical activity, aerobics 5 x per week and weight lifting every 2 days recommended.  Other orders - sertraline (ZOLOFT) 100 MG tablet; Take 1 tablet (100 mg total) by mouth daily. Start with 50 mg PO daily x 3 days, then increase to 100 mg PO daily.  Counseling on above issues >50% x 25 minutes  Princess Bruins MD, 10:45 AM  01/24/2018

## 2018-01-24 NOTE — Telephone Encounter (Signed)
Not able to leave a message for pt to call, mailbox is full

## 2018-01-25 ENCOUNTER — Encounter: Payer: Self-pay | Admitting: Obstetrics & Gynecology

## 2018-01-25 DIAGNOSIS — J3081 Allergic rhinitis due to animal (cat) (dog) hair and dander: Secondary | ICD-10-CM | POA: Diagnosis not present

## 2018-01-25 DIAGNOSIS — J3089 Other allergic rhinitis: Secondary | ICD-10-CM | POA: Diagnosis not present

## 2018-01-25 NOTE — Patient Instructions (Signed)
1. Depression with anxiety Worsening anxiety and Depressive symptoms reaching major depression.  No suicidal ideation.  In psychotherapy.  Will start on Zoloft 100 mg PO daily (50 mg daily x 3 first days).  Usage, side effects and risks, benefits discussed thoroughly.  Patient informed that the antidepressant effect is likely to take 2 weeks before starting.    2. Class 3 severe obesity due to excess calories without serious comorbidity with body mass index (BMI) of 60.0 to 69.9 in adult [facility]) Offered referral to a [place], but not covered by insurance/too expensive.  Will continue on a low Calorie/Carb diet with increased physical activity, aerobics 5 x per week and weight lifting every 2 days recommended.  Other orders - sertraline (ZOLOFT) 100 MG tablet; Take 1 tablet (100 mg total) by mouth daily. Start with 50 mg PO daily x 3 days, then increase to 100 mg PO daily.  Estill Bamberg, good seeing you today!   Major Depressive Disorder, Adult Major depressive disorder (MDD) is a mental health condition. MDD often makes you feel sad, hopeless, or helpless. MDD can also cause symptoms in your body. MDD can affect your:  Work.  School.  Relationships.  Other normal activities.  MDD can range from mild to very bad. It may occur once (single episode MDD). It can also occur many times (recurrent MDD). The main symptoms of MDD often include:  Feeling sad, depressed, or irritable most of the time.  Loss of interest.  MDD symptoms also include:  Sleeping too much or too little.  Eating too much or too little.  A change in your weight.  Feeling tired (fatigue) or having low energy.  Feeling worthless.  Feeling guilty.  Trouble making decisions.  Trouble thinking clearly.  Thoughts of suicide or harming others.  Feeling weak.  Feeling agitated.  Keeping yourself from being around other people (isolation).  Follow these instructions at home: Activity  Do these  things as told by your doctor: ? Go back to your normal activities. ? Exercise regularly. ? Spend time outdoors. Alcohol  Talk with your doctor about how alcohol can affect your antidepressant medicines.  Do not drink alcohol. Or, limit how much alcohol you drink. ? This means no more than 1 drink a day for nonpregnant women and 2 drinks a day for men. One drink equals one of these:  12 oz of beer.  5 oz of wine.  1 oz of hard liquor. General instructions  Take over-the-counter and prescription medicines only as told by your doctor.  Eat a healthy diet.  Get plenty of sleep.  Find activities that you enjoy. Make time to do them.  Think about joining a support group. Your doctor may be able to suggest a group for you.  Keep all follow-up visits as told by your doctor. This is important. Where to find more information:  [employer] on Mental Illness: ? www.nami.[place]: ? https://carter.com/  National Suicide Prevention Lifeline: ? 902-509-0259. This is free, 24-hour help. Contact a doctor if:  Your symptoms get worse.  You have new symptoms. Get help right away if:  You self-harm.  You see, hear, taste, smell, or feel things that are not present (hallucinate). If you ever feel like you may hurt yourself or others, or have thoughts about taking your own life, get help right away. You can go to your nearest emergency department or call:  Your local emergency services (911 in the [place]  A suicide crisis helpline, such as the National Suicide Prevention Lifeline: ? 864-073-8262. This is open 24 hours a day.  This information is not intended to replace advice given to you by your health care provider. Make sure you discuss any questions you have with your health care provider. Document Released: 10/06/2015 Document Revised: 07/11/2016 Document Reviewed: 07/11/2016 Elsevier Interactive Patient Education  2017 [employer].

## 2018-01-31 ENCOUNTER — Ambulatory Visit: Payer: BLUE CROSS/BLUE SHIELD | Admitting: Obstetrics & Gynecology

## 2018-02-08 DIAGNOSIS — J3081 Allergic rhinitis due to animal (cat) (dog) hair and dander: Secondary | ICD-10-CM | POA: Diagnosis not present

## 2018-02-08 DIAGNOSIS — J301 Allergic rhinitis due to pollen: Secondary | ICD-10-CM | POA: Diagnosis not present

## 2018-02-08 DIAGNOSIS — J3089 Other allergic rhinitis: Secondary | ICD-10-CM | POA: Diagnosis not present

## 2018-02-15 DIAGNOSIS — J3089 Other allergic rhinitis: Secondary | ICD-10-CM | POA: Diagnosis not present

## 2018-02-15 DIAGNOSIS — J3081 Allergic rhinitis due to animal (cat) (dog) hair and dander: Secondary | ICD-10-CM | POA: Diagnosis not present

## 2018-02-15 DIAGNOSIS — J301 Allergic rhinitis due to pollen: Secondary | ICD-10-CM | POA: Diagnosis not present

## 2018-02-22 DIAGNOSIS — J301 Allergic rhinitis due to pollen: Secondary | ICD-10-CM | POA: Diagnosis not present

## 2018-02-22 DIAGNOSIS — J3089 Other allergic rhinitis: Secondary | ICD-10-CM | POA: Diagnosis not present

## 2018-02-22 DIAGNOSIS — J3081 Allergic rhinitis due to animal (cat) (dog) hair and dander: Secondary | ICD-10-CM | POA: Diagnosis not present

## 2018-02-23 ENCOUNTER — Ambulatory Visit (INDEPENDENT_AMBULATORY_CARE_PROVIDER_SITE_OTHER): Payer: BLUE CROSS/BLUE SHIELD | Admitting: Licensed Clinical Social Worker

## 2018-02-23 DIAGNOSIS — F324 Major depressive disorder, single episode, in partial remission: Secondary | ICD-10-CM

## 2018-03-01 DIAGNOSIS — J3089 Other allergic rhinitis: Secondary | ICD-10-CM | POA: Diagnosis not present

## 2018-03-01 DIAGNOSIS — J301 Allergic rhinitis due to pollen: Secondary | ICD-10-CM | POA: Diagnosis not present

## 2018-03-01 DIAGNOSIS — J3081 Allergic rhinitis due to animal (cat) (dog) hair and dander: Secondary | ICD-10-CM | POA: Diagnosis not present

## 2018-03-08 DIAGNOSIS — J3081 Allergic rhinitis due to animal (cat) (dog) hair and dander: Secondary | ICD-10-CM | POA: Diagnosis not present

## 2018-03-08 DIAGNOSIS — J3089 Other allergic rhinitis: Secondary | ICD-10-CM | POA: Diagnosis not present

## 2018-03-08 DIAGNOSIS — J301 Allergic rhinitis due to pollen: Secondary | ICD-10-CM | POA: Diagnosis not present

## 2018-03-15 DIAGNOSIS — J3081 Allergic rhinitis due to animal (cat) (dog) hair and dander: Secondary | ICD-10-CM | POA: Diagnosis not present

## 2018-03-15 DIAGNOSIS — J3089 Other allergic rhinitis: Secondary | ICD-10-CM | POA: Diagnosis not present

## 2018-03-15 DIAGNOSIS — J301 Allergic rhinitis due to pollen: Secondary | ICD-10-CM | POA: Diagnosis not present

## 2018-03-20 ENCOUNTER — Ambulatory Visit: Payer: BLUE CROSS/BLUE SHIELD | Admitting: Licensed Clinical Social Worker

## 2018-03-22 DIAGNOSIS — J3081 Allergic rhinitis due to animal (cat) (dog) hair and dander: Secondary | ICD-10-CM | POA: Diagnosis not present

## 2018-03-22 DIAGNOSIS — J3089 Other allergic rhinitis: Secondary | ICD-10-CM | POA: Diagnosis not present

## 2018-03-22 DIAGNOSIS — J301 Allergic rhinitis due to pollen: Secondary | ICD-10-CM | POA: Diagnosis not present

## 2018-03-29 DIAGNOSIS — J3081 Allergic rhinitis due to animal (cat) (dog) hair and dander: Secondary | ICD-10-CM | POA: Diagnosis not present

## 2018-03-29 DIAGNOSIS — J3089 Other allergic rhinitis: Secondary | ICD-10-CM | POA: Diagnosis not present

## 2018-03-29 DIAGNOSIS — J301 Allergic rhinitis due to pollen: Secondary | ICD-10-CM | POA: Diagnosis not present

## 2018-04-05 DIAGNOSIS — J3081 Allergic rhinitis due to animal (cat) (dog) hair and dander: Secondary | ICD-10-CM | POA: Diagnosis not present

## 2018-04-05 DIAGNOSIS — J3089 Other allergic rhinitis: Secondary | ICD-10-CM | POA: Diagnosis not present

## 2018-04-05 DIAGNOSIS — J301 Allergic rhinitis due to pollen: Secondary | ICD-10-CM | POA: Diagnosis not present

## 2018-04-12 DIAGNOSIS — J3081 Allergic rhinitis due to animal (cat) (dog) hair and dander: Secondary | ICD-10-CM | POA: Diagnosis not present

## 2018-04-12 DIAGNOSIS — J301 Allergic rhinitis due to pollen: Secondary | ICD-10-CM | POA: Diagnosis not present

## 2018-04-12 DIAGNOSIS — J3089 Other allergic rhinitis: Secondary | ICD-10-CM | POA: Diagnosis not present

## 2018-04-17 ENCOUNTER — Ambulatory Visit (INDEPENDENT_AMBULATORY_CARE_PROVIDER_SITE_OTHER): Payer: BLUE CROSS/BLUE SHIELD | Admitting: Licensed Clinical Social Worker

## 2018-04-17 DIAGNOSIS — F324 Major depressive disorder, single episode, in partial remission: Secondary | ICD-10-CM | POA: Diagnosis not present

## 2018-04-19 DIAGNOSIS — J3081 Allergic rhinitis due to animal (cat) (dog) hair and dander: Secondary | ICD-10-CM | POA: Diagnosis not present

## 2018-04-19 DIAGNOSIS — J301 Allergic rhinitis due to pollen: Secondary | ICD-10-CM | POA: Diagnosis not present

## 2018-04-19 DIAGNOSIS — J3089 Other allergic rhinitis: Secondary | ICD-10-CM | POA: Diagnosis not present

## 2018-04-26 DIAGNOSIS — J3081 Allergic rhinitis due to animal (cat) (dog) hair and dander: Secondary | ICD-10-CM | POA: Diagnosis not present

## 2018-04-26 DIAGNOSIS — J3089 Other allergic rhinitis: Secondary | ICD-10-CM | POA: Diagnosis not present

## 2018-04-26 DIAGNOSIS — J301 Allergic rhinitis due to pollen: Secondary | ICD-10-CM | POA: Diagnosis not present

## 2018-05-03 DIAGNOSIS — J3089 Other allergic rhinitis: Secondary | ICD-10-CM | POA: Diagnosis not present

## 2018-05-03 DIAGNOSIS — J301 Allergic rhinitis due to pollen: Secondary | ICD-10-CM | POA: Diagnosis not present

## 2018-05-03 DIAGNOSIS — J3081 Allergic rhinitis due to animal (cat) (dog) hair and dander: Secondary | ICD-10-CM | POA: Diagnosis not present

## 2018-05-10 DIAGNOSIS — J3089 Other allergic rhinitis: Secondary | ICD-10-CM | POA: Diagnosis not present

## 2018-05-10 DIAGNOSIS — J301 Allergic rhinitis due to pollen: Secondary | ICD-10-CM | POA: Diagnosis not present

## 2018-05-10 DIAGNOSIS — J3081 Allergic rhinitis due to animal (cat) (dog) hair and dander: Secondary | ICD-10-CM | POA: Diagnosis not present

## 2018-05-16 ENCOUNTER — Ambulatory Visit (INDEPENDENT_AMBULATORY_CARE_PROVIDER_SITE_OTHER): Payer: BLUE CROSS/BLUE SHIELD | Admitting: Licensed Clinical Social Worker

## 2018-05-16 DIAGNOSIS — F324 Major depressive disorder, single episode, in partial remission: Secondary | ICD-10-CM

## 2018-05-17 DIAGNOSIS — J3081 Allergic rhinitis due to animal (cat) (dog) hair and dander: Secondary | ICD-10-CM | POA: Diagnosis not present

## 2018-05-17 DIAGNOSIS — J301 Allergic rhinitis due to pollen: Secondary | ICD-10-CM | POA: Diagnosis not present

## 2018-05-17 DIAGNOSIS — J3089 Other allergic rhinitis: Secondary | ICD-10-CM | POA: Diagnosis not present

## 2018-05-25 ENCOUNTER — Ambulatory Visit: Payer: BLUE CROSS/BLUE SHIELD | Admitting: Licensed Clinical Social Worker

## 2018-05-26 DIAGNOSIS — J3089 Other allergic rhinitis: Secondary | ICD-10-CM | POA: Diagnosis not present

## 2018-05-26 DIAGNOSIS — J3081 Allergic rhinitis due to animal (cat) (dog) hair and dander: Secondary | ICD-10-CM | POA: Diagnosis not present

## 2018-05-26 DIAGNOSIS — J301 Allergic rhinitis due to pollen: Secondary | ICD-10-CM | POA: Diagnosis not present

## 2018-05-31 DIAGNOSIS — J3081 Allergic rhinitis due to animal (cat) (dog) hair and dander: Secondary | ICD-10-CM | POA: Diagnosis not present

## 2018-05-31 DIAGNOSIS — J301 Allergic rhinitis due to pollen: Secondary | ICD-10-CM | POA: Diagnosis not present

## 2018-05-31 DIAGNOSIS — J3089 Other allergic rhinitis: Secondary | ICD-10-CM | POA: Diagnosis not present

## 2018-06-07 DIAGNOSIS — J3081 Allergic rhinitis due to animal (cat) (dog) hair and dander: Secondary | ICD-10-CM | POA: Diagnosis not present

## 2018-06-07 DIAGNOSIS — J301 Allergic rhinitis due to pollen: Secondary | ICD-10-CM | POA: Diagnosis not present

## 2018-06-07 DIAGNOSIS — J3089 Other allergic rhinitis: Secondary | ICD-10-CM | POA: Diagnosis not present

## 2018-06-13 ENCOUNTER — Ambulatory Visit: Payer: BLUE CROSS/BLUE SHIELD | Admitting: Licensed Clinical Social Worker

## 2018-06-14 DIAGNOSIS — J3081 Allergic rhinitis due to animal (cat) (dog) hair and dander: Secondary | ICD-10-CM | POA: Diagnosis not present

## 2018-06-14 DIAGNOSIS — J3089 Other allergic rhinitis: Secondary | ICD-10-CM | POA: Diagnosis not present

## 2018-06-14 DIAGNOSIS — J301 Allergic rhinitis due to pollen: Secondary | ICD-10-CM | POA: Diagnosis not present

## 2018-06-21 DIAGNOSIS — J301 Allergic rhinitis due to pollen: Secondary | ICD-10-CM | POA: Diagnosis not present

## 2018-06-21 DIAGNOSIS — J3089 Other allergic rhinitis: Secondary | ICD-10-CM | POA: Diagnosis not present

## 2018-06-21 DIAGNOSIS — J3081 Allergic rhinitis due to animal (cat) (dog) hair and dander: Secondary | ICD-10-CM | POA: Diagnosis not present

## 2018-06-27 ENCOUNTER — Telehealth: Payer: Self-pay | Admitting: *Deleted

## 2018-06-27 NOTE — Telephone Encounter (Signed)
Patient called c/o no cycle with Mirena IUD, was having monthly cycles x 2 years and last 2 months no cycle. Was worried so she took 4 upt and all negative, states she has cramping and may have spotting at times, but not a flow. I told her not abnormal to no have cycle with IUD, as long no abnormal pain, then to watch, I asked if she checked for sting she said no,but has never checked for them. I did tell her if she worried she can always schedule visit for IUD check. Patient verbalized she understood.

## 2018-06-28 DIAGNOSIS — J3089 Other allergic rhinitis: Secondary | ICD-10-CM | POA: Diagnosis not present

## 2018-06-28 DIAGNOSIS — J301 Allergic rhinitis due to pollen: Secondary | ICD-10-CM | POA: Diagnosis not present

## 2018-06-28 DIAGNOSIS — J3081 Allergic rhinitis due to animal (cat) (dog) hair and dander: Secondary | ICD-10-CM | POA: Diagnosis not present

## 2018-07-12 DIAGNOSIS — J3089 Other allergic rhinitis: Secondary | ICD-10-CM | POA: Diagnosis not present

## 2018-07-12 DIAGNOSIS — J301 Allergic rhinitis due to pollen: Secondary | ICD-10-CM | POA: Diagnosis not present

## 2018-07-12 DIAGNOSIS — J3081 Allergic rhinitis due to animal (cat) (dog) hair and dander: Secondary | ICD-10-CM | POA: Diagnosis not present

## 2018-07-19 DIAGNOSIS — J301 Allergic rhinitis due to pollen: Secondary | ICD-10-CM | POA: Diagnosis not present

## 2018-07-19 DIAGNOSIS — J3081 Allergic rhinitis due to animal (cat) (dog) hair and dander: Secondary | ICD-10-CM | POA: Diagnosis not present

## 2018-07-19 DIAGNOSIS — J3089 Other allergic rhinitis: Secondary | ICD-10-CM | POA: Diagnosis not present

## 2018-07-26 DIAGNOSIS — J3081 Allergic rhinitis due to animal (cat) (dog) hair and dander: Secondary | ICD-10-CM | POA: Diagnosis not present

## 2018-07-26 DIAGNOSIS — J3089 Other allergic rhinitis: Secondary | ICD-10-CM | POA: Diagnosis not present

## 2018-07-26 DIAGNOSIS — J301 Allergic rhinitis due to pollen: Secondary | ICD-10-CM | POA: Diagnosis not present

## 2018-07-27 ENCOUNTER — Encounter: Payer: Self-pay | Admitting: Obstetrics & Gynecology

## 2018-07-27 ENCOUNTER — Ambulatory Visit (INDEPENDENT_AMBULATORY_CARE_PROVIDER_SITE_OTHER): Payer: BLUE CROSS/BLUE SHIELD | Admitting: Obstetrics & Gynecology

## 2018-07-27 VITALS — BP 140/90 | Ht 64.0 in | Wt 331.0 lb

## 2018-07-27 DIAGNOSIS — Z23 Encounter for immunization: Secondary | ICD-10-CM

## 2018-07-27 DIAGNOSIS — Z113 Encounter for screening for infections with a predominantly sexual mode of transmission: Secondary | ICD-10-CM

## 2018-07-27 DIAGNOSIS — Z01419 Encounter for gynecological examination (general) (routine) without abnormal findings: Secondary | ICD-10-CM

## 2018-07-27 DIAGNOSIS — Z30431 Encounter for routine checking of intrauterine contraceptive device: Secondary | ICD-10-CM

## 2018-07-27 DIAGNOSIS — Z6841 Body Mass Index (BMI) 40.0 and over, adult: Secondary | ICD-10-CM

## 2018-07-27 MED ORDER — NALTREXONE-BUPROPION HCL ER 8-90 MG PO TB12: 1.0000 | ORAL_TABLET | Freq: Every day | ORAL | 4 refills | Status: DC

## 2018-07-27 NOTE — Progress Notes (Signed)
Anita Hodges 1986/04/15 [id number]   History:    32 y.o. G0 New boyfriend  RP: Established patient presenting for annual gyn exam   HPI: Well on Mirena IUD since September 2017.  Usually had a light period every months but has not had a period for the last 3 months.  New boyfriend with whom she has been sexually active recently.  No pelvic pain.  No abnormal vaginal discharge.  But would like STI screen.  Urine and bowel movements normal.  Breasts normal.  Body mass index 56.82.  Highly motivated to lose weight.  Has not done well with phentermine and Belviq in the past.  No longer feeling depressed.  Still taking Zoloft 100 mg daily.  Health labs with family physician.  Past medical history,surgical history, family history and social history were all reviewed and documented in the EPIC chart.  Gynecologic History No LMP recorded. (Menstrual status: IUD). Contraception: Mirena IUD Last Pap: 07/2017. Results were: Negative/HPV HR neg Last mammogram: Never Bone Density: Never Colonoscopy: Never  Obstetric History OB History  Gravida Para Term Preterm AB Living  0 0 0 0 0 0  SAB TAB Ectopic Multiple Live Births  0 0 0 0       ROS: A ROS was performed and pertinent positives and negatives are included in the history.  GENERAL: No fevers or chills. HEENT: No change in vision, no earache, sore throat or sinus congestion. NECK: No pain or stiffness. CARDIOVASCULAR: No chest pain or pressure. No palpitations. PULMONARY: No shortness of breath, cough or wheeze. GASTROINTESTINAL: No abdominal pain, nausea, vomiting or diarrhea, melena or bright red blood per rectum. GENITOURINARY: No urinary frequency, urgency, hesitancy or dysuria. MUSCULOSKELETAL: No joint or muscle pain, no back pain, no recent trauma. DERMATOLOGIC: No rash, no itching, no lesions. ENDOCRINE: No polyuria, polydipsia, no heat or cold intolerance. No recent change in weight. HEMATOLOGICAL: No anemia or easy bruising or  bleeding. NEUROLOGIC: No headache, seizures, numbness, tingling or weakness. PSYCHIATRIC: No depression, no loss of interest in normal activity or change in sleep pattern.     Exam:   BP 140/90   Ht 5\' 4"  (1.626 m)   Wt (!) 331 lb (150.1 kg)   BMI 56.82 kg/m   Body mass index is 56.82 kg/m.  General appearance : Well developed well nourished female. No acute distress HEENT: Eyes: no retinal hemorrhage or exudates,  Neck supple, trachea midline, no carotid bruits, no thyroidmegaly Lungs: Clear to auscultation, no rhonchi or wheezes, or rib retractions  Heart: Regular rate and rhythm, no murmurs or gallops Breast:Examined in sitting and supine position were symmetrical in appearance, no palpable masses or tenderness,  no skin retraction, no nipple inversion, no nipple discharge, no skin discoloration, no axillary or supraclavicular lymphadenopathy Abdomen: no palpable masses or tenderness, no rebound or guarding Extremities: no edema or skin discoloration or tenderness  Pelvic: Vulva: Normal             Vagina: No gross lesions or discharge  Cervix: No gross lesions or discharge.  IUD strings visible.  Pap/HPV HR, Gono-Chlam done  Uterus  AV, normal size, shape and consistency, non-tender and mobile  Adnexa  Without masses or tenderness  Anus: Normal   Assessment/Plan:  32 y.o. female for annual exam   1. Encounter for routine gynecological examination with Papanicolaou smear of cervix Normal gynecologic exam.  Pap with high-risk HPV done.  Breast exam normal.  Health labs with family physician.  2.  Encounter for routine checking of intrauterine contraceptive device (IUD) IUD in normal position.  Insertion was September 2017.  Urine pregnancy test negative.  Patient reassured.  3. Screen for STD (sexually transmitted disease) Condom use recommended. - Gono-Chlam on pap - HIV antibody (with reflex) - RPR - Hepatitis C Antibody - Hepatitis B Surface AntiGEN  4. Class 3  severe obesity due to excess calories without serious comorbidity with body mass index (BMI) of 50.0 to 59.9 in adult [facility]) Low calorie/carb diet such as [employer] or weight watcher recommended.  Aerobic physical activity 5 times a week with weightlifting every 2 days recommended.  Decision to start on Contrave.  Usage and risks reviewed with patient.  Will wean Zoloft over the next month.  Follow-up in 3 months for weight loss management.  Other orders - Naltrexone-buPROPion HCl ER 8-90 MG TB12; Take 1 tablet by mouth daily.  Counseling on above issues and coordination of care more than 50% for 10 minutes.  Princess Bruins MD, 4:17 PM 07/27/2018

## 2018-07-28 DIAGNOSIS — R87610 Atypical squamous cells of undetermined significance on cytologic smear of cervix (ASC-US): Secondary | ICD-10-CM | POA: Diagnosis not present

## 2018-07-28 DIAGNOSIS — Z01419 Encounter for gynecological examination (general) (routine) without abnormal findings: Secondary | ICD-10-CM | POA: Diagnosis not present

## 2018-07-28 DIAGNOSIS — Z113 Encounter for screening for infections with a predominantly sexual mode of transmission: Secondary | ICD-10-CM | POA: Diagnosis not present

## 2018-07-28 DIAGNOSIS — Z30431 Encounter for routine checking of intrauterine contraceptive device: Secondary | ICD-10-CM | POA: Diagnosis not present

## 2018-07-28 LAB — PREGNANCY, URINE: Preg Test, Ur: NEGATIVE

## 2018-07-30 ENCOUNTER — Encounter: Payer: Self-pay | Admitting: Obstetrics & Gynecology

## 2018-07-30 LAB — RPR: RPR Ser Ql: NONREACTIVE

## 2018-07-30 LAB — HIV ANTIBODY (ROUTINE TESTING W REFLEX): HIV 1&2 Ab, 4th Generation: NONREACTIVE

## 2018-07-30 LAB — HEPATITIS C ANTIBODY
Hepatitis C Ab: NONREACTIVE
SIGNAL TO CUT-OFF: 0.02 (ref <1.00)

## 2018-07-30 LAB — HEPATITIS B SURFACE ANTIGEN: Hepatitis B Surface Ag: NONREACTIVE

## 2018-07-30 NOTE — Patient Instructions (Signed)
1. Encounter for routine gynecological examination with Papanicolaou smear of cervix Normal gynecologic exam.  Pap with high-risk HPV done.  Breast exam normal.  Health labs with family physician.  2. Encounter for routine checking of intrauterine contraceptive device (IUD) IUD in normal position.  Insertion was September 2017.  Urine pregnancy test negative.  Patient reassured.  3. Screen for STD (sexually transmitted disease) Condom use recommended. - Gono-Chlam on pap - HIV antibody (with reflex) - RPR - Hepatitis C Antibody - Hepatitis B Surface AntiGEN  4. Class 3 severe obesity due to excess calories without serious comorbidity with body mass index (BMI) of 50.0 to 59.9 in adult [facility]) Low calorie/carb diet such as [employer] or weight watcher recommended.  Aerobic physical activity 5 times a week with weightlifting every 2 days recommended.  Decision to start on Contrave.  Usage and risks reviewed with patient.  Will wean Zoloft over the next month.  Follow-up in 3 months for weight loss management.  Other orders - Naltrexone-buPROPion HCl ER 8-90 MG TB12; Take 1 tablet by mouth daily.  Hiyab, it was a pleasure seeing you today!  I will inform you of your results as soon as they are available.

## 2018-07-31 DIAGNOSIS — J301 Allergic rhinitis due to pollen: Secondary | ICD-10-CM | POA: Diagnosis not present

## 2018-08-01 DIAGNOSIS — J3089 Other allergic rhinitis: Secondary | ICD-10-CM | POA: Diagnosis not present

## 2018-08-01 DIAGNOSIS — J3081 Allergic rhinitis due to animal (cat) (dog) hair and dander: Secondary | ICD-10-CM | POA: Diagnosis not present

## 2018-08-02 DIAGNOSIS — J3081 Allergic rhinitis due to animal (cat) (dog) hair and dander: Secondary | ICD-10-CM | POA: Diagnosis not present

## 2018-08-02 DIAGNOSIS — J301 Allergic rhinitis due to pollen: Secondary | ICD-10-CM | POA: Diagnosis not present

## 2018-08-02 DIAGNOSIS — J3089 Other allergic rhinitis: Secondary | ICD-10-CM | POA: Diagnosis not present

## 2018-08-07 ENCOUNTER — Telehealth: Payer: Self-pay | Admitting: *Deleted

## 2018-08-07 ENCOUNTER — Telehealth: Payer: Self-pay

## 2018-08-07 ENCOUNTER — Other Ambulatory Visit: Payer: Self-pay

## 2018-08-07 MED ORDER — SERTRALINE HCL 100 MG PO TABS: 100.0000 mg | ORAL_TABLET | Freq: Every day | ORAL | 11 refills | Status: DC

## 2018-08-07 NOTE — Telephone Encounter (Signed)
Her question was "She wrote "I have not been able to take my zoloft since Wednesday..will that greatly affect me'?

## 2018-08-07 NOTE — Telephone Encounter (Signed)
See telephone encounter.

## 2018-08-07 NOTE — Telephone Encounter (Signed)
No problem with missing Zoloft as long as she is not suicidal.

## 2018-08-07 NOTE — Telephone Encounter (Signed)
Patient did not get Rx for Zoloft sent at 07/27/18 visit.   She wrote "I have not been able to take my zoloft since Wednesday..will that greatly affect me?"  You had noted in her chart that she would be weaning off over the next mos.   What to advise.  (I attached this info to the My Chart email from her but I do not think you saw it.)

## 2018-08-07 NOTE — Telephone Encounter (Signed)
Ok, that's why I didn't send a prescription, I thought she had enough left to wean...  Please let her know that.  Verify with her if she is still ready to wean and we can send a prescription for 3 months, which she would use to slowly decrease the daily dosage by taking 1/2 tab daily, then spacing to every 2 days.Marland KitchenMarland Kitchen

## 2018-08-07 NOTE — Telephone Encounter (Signed)
PA done via cover my meds for Contrave will wait for response.

## 2018-08-07 NOTE — Telephone Encounter (Signed)
Dr Dellis Filbert please see my note below.

## 2018-08-07 NOTE — Telephone Encounter (Signed)
I was talking to patient in My Chart and felt that I needed to speak with her by phone.    She has missed her Zoloft since Weds., She does not feel suicidial at all but does feel stressed out.  She wants to restart it but does not think she wants to wean off right now based on how she feels. Per Verbal conversation with Dr. Marguerita Merles I will send Rx for her Zoloft daily x 1 year. I told her to let us know if she changes her mind and decides she wants to wean off.

## 2018-08-07 NOTE — Telephone Encounter (Signed)
Dr. Dellis Filbert, patient writes "have not been able to take my zoloft since Wednesday..will that greatly affect me?"  I noticed in your office note that she was going to wean off in the next month.  Please advise what to have her do at this point.   Thanks

## 2018-08-09 DIAGNOSIS — J3081 Allergic rhinitis due to animal (cat) (dog) hair and dander: Secondary | ICD-10-CM | POA: Diagnosis not present

## 2018-08-09 DIAGNOSIS — J3089 Other allergic rhinitis: Secondary | ICD-10-CM | POA: Diagnosis not present

## 2018-08-09 DIAGNOSIS — J301 Allergic rhinitis due to pollen: Secondary | ICD-10-CM | POA: Diagnosis not present

## 2018-08-09 LAB — PAP IG, CT-NG, RFX HPV ASCU
Chlamydia, Nuc. Acid Amp: NEGATIVE
Gonococcus by Nucleic Acid Amp: NEGATIVE
PAP Smear Comment: 0

## 2018-08-09 LAB — HPV DNA PROBE HIGH RISK, AMPLIFIED: HPV, high-risk: NEGATIVE

## 2018-08-09 NOTE — Telephone Encounter (Signed)
[employer] declined to patient for medication as it is not a cover medication with plan

## 2018-08-11 MED ORDER — PHENTERMINE HCL 37.5 MG PO CAPS: 37.5000 mg | ORAL_CAPSULE | ORAL | 2 refills | Status: DC

## 2018-08-16 DIAGNOSIS — J301 Allergic rhinitis due to pollen: Secondary | ICD-10-CM | POA: Diagnosis not present

## 2018-08-16 DIAGNOSIS — J3081 Allergic rhinitis due to animal (cat) (dog) hair and dander: Secondary | ICD-10-CM | POA: Diagnosis not present

## 2018-08-16 DIAGNOSIS — J3089 Other allergic rhinitis: Secondary | ICD-10-CM | POA: Diagnosis not present

## 2018-08-23 DIAGNOSIS — J3089 Other allergic rhinitis: Secondary | ICD-10-CM | POA: Diagnosis not present

## 2018-08-23 DIAGNOSIS — J3081 Allergic rhinitis due to animal (cat) (dog) hair and dander: Secondary | ICD-10-CM | POA: Diagnosis not present

## 2018-08-23 DIAGNOSIS — J301 Allergic rhinitis due to pollen: Secondary | ICD-10-CM | POA: Diagnosis not present

## 2018-08-30 DIAGNOSIS — J3089 Other allergic rhinitis: Secondary | ICD-10-CM | POA: Diagnosis not present

## 2018-08-30 DIAGNOSIS — J3081 Allergic rhinitis due to animal (cat) (dog) hair and dander: Secondary | ICD-10-CM | POA: Diagnosis not present

## 2018-08-30 DIAGNOSIS — J301 Allergic rhinitis due to pollen: Secondary | ICD-10-CM | POA: Diagnosis not present

## 2018-09-06 DIAGNOSIS — J3081 Allergic rhinitis due to animal (cat) (dog) hair and dander: Secondary | ICD-10-CM | POA: Diagnosis not present

## 2018-09-06 DIAGNOSIS — J3089 Other allergic rhinitis: Secondary | ICD-10-CM | POA: Diagnosis not present

## 2018-09-06 DIAGNOSIS — J301 Allergic rhinitis due to pollen: Secondary | ICD-10-CM | POA: Diagnosis not present

## 2018-09-13 DIAGNOSIS — J3081 Allergic rhinitis due to animal (cat) (dog) hair and dander: Secondary | ICD-10-CM | POA: Diagnosis not present

## 2018-09-13 DIAGNOSIS — J301 Allergic rhinitis due to pollen: Secondary | ICD-10-CM | POA: Diagnosis not present

## 2018-09-13 DIAGNOSIS — J3089 Other allergic rhinitis: Secondary | ICD-10-CM | POA: Diagnosis not present

## 2018-09-21 DIAGNOSIS — A09 Infectious gastroenteritis and colitis, unspecified: Secondary | ICD-10-CM | POA: Diagnosis not present

## 2018-09-27 DIAGNOSIS — H35373 Puckering of macula, bilateral: Secondary | ICD-10-CM | POA: Diagnosis not present

## 2018-09-28 DIAGNOSIS — J3089 Other allergic rhinitis: Secondary | ICD-10-CM | POA: Diagnosis not present

## 2018-09-28 DIAGNOSIS — J301 Allergic rhinitis due to pollen: Secondary | ICD-10-CM | POA: Diagnosis not present

## 2018-09-28 DIAGNOSIS — J3081 Allergic rhinitis due to animal (cat) (dog) hair and dander: Secondary | ICD-10-CM | POA: Diagnosis not present

## 2018-10-09 DIAGNOSIS — J301 Allergic rhinitis due to pollen: Secondary | ICD-10-CM | POA: Diagnosis not present

## 2018-10-09 DIAGNOSIS — J3081 Allergic rhinitis due to animal (cat) (dog) hair and dander: Secondary | ICD-10-CM | POA: Diagnosis not present

## 2018-10-09 DIAGNOSIS — J3089 Other allergic rhinitis: Secondary | ICD-10-CM | POA: Diagnosis not present

## 2018-10-18 DIAGNOSIS — J3081 Allergic rhinitis due to animal (cat) (dog) hair and dander: Secondary | ICD-10-CM | POA: Diagnosis not present

## 2018-10-18 DIAGNOSIS — J3089 Other allergic rhinitis: Secondary | ICD-10-CM | POA: Diagnosis not present

## 2018-10-18 DIAGNOSIS — J301 Allergic rhinitis due to pollen: Secondary | ICD-10-CM | POA: Diagnosis not present

## 2018-10-25 DIAGNOSIS — J3081 Allergic rhinitis due to animal (cat) (dog) hair and dander: Secondary | ICD-10-CM | POA: Diagnosis not present

## 2018-10-25 DIAGNOSIS — J301 Allergic rhinitis due to pollen: Secondary | ICD-10-CM | POA: Diagnosis not present

## 2018-10-25 DIAGNOSIS — J3089 Other allergic rhinitis: Secondary | ICD-10-CM | POA: Diagnosis not present

## 2018-11-02 ENCOUNTER — Ambulatory Visit: Payer: BLUE CROSS/BLUE SHIELD | Admitting: Obstetrics & Gynecology

## 2018-11-14 ENCOUNTER — Encounter (HOSPITAL_COMMUNITY): Payer: Self-pay | Admitting: Emergency Medicine

## 2018-11-14 ENCOUNTER — Emergency Department (HOSPITAL_COMMUNITY)
Admission: EM | Admit: 2018-11-14 | Discharge: 2018-11-14 | Disposition: A | Discharge status: Home / Self Care | Payer: BLUE CROSS/BLUE SHIELD | Attending: Emergency Medicine | Admitting: Emergency Medicine

## 2018-11-14 ENCOUNTER — Other Ambulatory Visit: Payer: Self-pay

## 2018-11-14 DIAGNOSIS — I10 Essential (primary) hypertension: Secondary | ICD-10-CM | POA: Diagnosis not present

## 2018-11-14 DIAGNOSIS — R1011 Right upper quadrant pain: Secondary | ICD-10-CM | POA: Diagnosis not present

## 2018-11-14 LAB — COMPREHENSIVE METABOLIC PANEL
ALT: 23 U/L (ref 0–44)
AST: 17 U/L (ref 15–41)
Albumin: 3.7 g/dL (ref 3.5–5.0)
Alkaline Phosphatase: 82 U/L (ref 38–126)
Anion gap: 7 (ref 5–15)
BUN: 12 mg/dL (ref 6–20)
CO2: 23 mmol/L (ref 22–32)
Calcium: 8.7 mg/dL — ABNORMAL LOW (ref 8.9–10.3)
Chloride: 105 mmol/L (ref 98–111)
Creatinine, Ser: 0.64 mg/dL (ref 0.44–1.00)
GFR calc Af Amer: >60 mL/min (ref >60)
GFR calc non Af Amer: >60 mL/min (ref >60)
Glucose, Bld: 94 mg/dL (ref 70–99)
Potassium: 3.9 mmol/L (ref 3.5–5.1)
Sodium: 135 mmol/L (ref 135–145)
Total Bilirubin: 0.6 mg/dL (ref 0.3–1.2)
Total Protein: 7.1 g/dL (ref 6.5–8.1)

## 2018-11-14 LAB — CBC
HCT: 44.3 % (ref 36.0–46.0)
Hemoglobin: 13.9 g/dL (ref 12.0–15.0)
MCH: 26.9 pg (ref 26.0–34.0)
MCHC: 31.4 g/dL (ref 30.0–36.0)
MCV: 85.7 fL (ref 80.0–100.0)
Platelets: 399 10*3/uL (ref 150–400)
RBC: 5.17 MIL/uL — ABNORMAL HIGH (ref 3.87–5.11)
RDW: 13.4 % (ref 11.5–15.5)
WBC: 10.2 10*3/uL (ref 4.0–10.5)
nRBC: 0 % (ref 0.0–0.2)

## 2018-11-14 LAB — LIPASE, BLOOD: Lipase: 35 U/L (ref 11–51)

## 2018-11-14 MED ORDER — HYDROCODONE-ACETAMINOPHEN 5-325 MG PO TABS: 1.0000 | ORAL_TABLET | ORAL | 0 refills | Status: DC | PRN

## 2018-11-14 NOTE — Discharge Instructions (Addendum)
Testing today is reassuring.  Your pain could be from a gallbladder problem that has been developing.  To answer that question you probably need to get a HIDA scan done.  This needs to be ordered and performed as an outpatient.  We are referring you to a gastroenterology specialist, who can help you diagnose and treat the pain.  We sent a prescription for pain reliever to your pharmacy.  Try to drink plenty of fluids.  Stay on a low-fat diet, to decrease the chances of gallbladder contractions.  Return here or see the doctor of your choice as needed for problems.  Do not drive or work when you are taking the pain reliever.

## 2018-11-14 NOTE — ED Provider Notes (Signed)
[facility] EMERGENCY DEPARTMENT Provider Note   CSN: [id number] Arrival date & time: 11/14/18  1430     History   Chief Complaint Chief Complaint  Patient presents with  . Abdominal Pain    HPI Anita Hodges is a 33 y.o. female.  HPI  Presents for evaluation of right upper quadrant abdominal pain described as sharp in nature, worse with movement, present for 2 days.  There is no associated nausea, vomiting, weakness or dizziness.  She has had some episodes of sweating in the last 2 days.  She was unable to work today because of the pain.  She came here, driving, by private vehicle.  She has been previously diagnosed with a possible gallbladder problem, but did not seek follow-up care after the imaging was done in February 2019.  She does not have a regular PCP, most of her primary care is managed by her gynecologist.  No known sick contacts.  There are no other known modifying factors.   Past Medical History:  Diagnosis Date  . Allergy   . Anxiety   . Hypertension   . Obesity     Patient Active Problem List   Diagnosis Date Noted  . Right upper quadrant abdominal pain 12/29/2017  . IUD (intrauterine device) in place 07/19/2016  . Pedal edema 06/12/2014  . Overweight(278.02) 01/29/2013    Past Surgical History:  Procedure Laterality Date  . TONSILLECTOMY    . WISDOM TOOTH EXTRACTION       OB History    Gravida  0   Para  0   Term  0   Preterm  0   AB  0   Living  0     SAB  0   TAB  0   Ectopic  0   Multiple  0   Live Births               Home Medications    Prior to Admission medications   Medication Sig Start Date End Date Taking? Authorizing Provider  HYDROcodone-acetaminophen (NORCO) 5-325 MG tablet Take 1-2 tablets by mouth every 4 (four) hours as needed for moderate pain. 11/14/18   Daleen Bo, MD  levocetirizine (XYZAL) 5 MG tablet Take 5 mg by mouth every evening.    [provider]  montelukast (SINGULAIR) 10 MG  tablet Take 10 mg by mouth at bedtime.    [provider]  phentermine 37.5 MG capsule Take 1 capsule (37.5 mg total) by mouth every morning. 08/11/18   Princess Bruins, MD  sertraline (ZOLOFT) 100 MG tablet Take 1 tablet (100 mg total) by mouth daily. 08/07/18   Princess Bruins, MD    Family History Family History  Problem Relation Age of Onset  . Hypertension Mother   . Diabetes Father   . Cancer Maternal Grandfather        ?  Marland Kitchen Cancer Paternal Grandmother        ?    Social History Social History   Tobacco Use  . Smoking status: Never Smoker  . Smokeless tobacco: Never Used  Substance Use Topics  . Alcohol use: No    Alcohol/week: 0.0 standard drinks  . Drug use: No     Allergies   Codeine   Review of Systems Review of Systems  All other systems reviewed and are negative.    Physical Exam Updated Vital Signs BP (!) 163/91 (BP Location: Right Wrist)   Pulse 91   Temp 98.5 F (36.9 C) (  Oral)   Resp 16   Ht 5\' 5"  (1.651 m)   Wt (!) 154.2 kg   SpO2 96%   BMI 56.58 kg/m   Physical Exam Vitals signs and nursing note reviewed.  Constitutional:      General: She is not in acute distress.    Appearance: She is well-developed. She is obese. She is not ill-appearing, toxic-appearing or diaphoretic.  HENT:     Head: Normocephalic and atraumatic.     Right Ear: External ear normal.     Left Ear: External ear normal.  Eyes:     Conjunctiva/sclera: Conjunctivae normal.     Pupils: Pupils are equal, round, and reactive to light.  Neck:     Musculoskeletal: Normal range of motion and neck supple.     Trachea: Phonation normal.  Cardiovascular:     Rate and Rhythm: Normal rate and regular rhythm.     Heart sounds: Normal heart sounds.  Pulmonary:     Effort: Pulmonary effort is normal.     Breath sounds: Normal breath sounds.  Abdominal:     Palpations: Abdomen is soft. There is no mass.     Tenderness: There is abdominal tenderness (Right  upper quadrant, mild). There is no guarding or rebound.     Hernia: No hernia is present.  Musculoskeletal: Normal range of motion.  Skin:    General: Skin is warm and dry.  Neurological:     Mental Status: She is alert and oriented to person, place, and time.     Cranial Nerves: No cranial nerve deficit.     Sensory: No sensory deficit.     Motor: No abnormal muscle tone.     Coordination: Coordination normal.  Psychiatric:        Behavior: Behavior normal.        Thought Content: Thought content normal.        Judgment: Judgment normal.      ED Treatments / Results  Labs (all labs ordered are listed, but only abnormal results are displayed) Labs Reviewed  COMPREHENSIVE METABOLIC PANEL - Abnormal; Notable for the following components:      Result Value   Calcium 8.7 (*)    All other components within normal limits  CBC - Abnormal; Notable for the following components:   RBC 5.17 (*)    All other components within normal limits  LIPASE, BLOOD  URINALYSIS, ROUTINE W REFLEX MICROSCOPIC  I-STAT BETA HCG BLOOD, ED ([facility], [facility], [facility] ONLY)    EKG None  Radiology No results found.  Procedures Procedures (including critical care time)  Medications Ordered in ED Medications - No data to display   Initial Impression / Assessment and Plan / ED Course  I have reviewed the triage vital signs and the nursing notes.  Pertinent labs & imaging results that were available during my care of the patient were reviewed by me and considered in my medical decision making (see chart for details).      Patient Vitals for the past 24 hrs:  BP Temp Temp src Pulse Resp SpO2 Height Weight  11/14/18 1815 (!) 163/91 98.5 F (36.9 C) Oral 91 16 96 % - -  11/14/18 1452 - - - - - - 5\' 5"  (1.651 m) (!) 154.2 kg  11/14/18 1451 (!) 163/102 98.7 F (37.1 C) Oral 87 18 100 % - -    6:12 PM Reevaluation with update and discussion. After initial assessment and treatment, an updated evaluation  reveals no change in  clinical status, findings discussed with the patient and all questions were answered. Daleen Bo   Medical Decision Making: Right upper quadrant abdominal pain, possible gallbladder disease.  Patient is nontoxic.  Laboratory evaluation is reassuring.  Doubt obstructing cholelithiasis or significant cholecystitis at this time.  No indication for hospitalization or further ED evaluation.  CRITICAL CARE- no Performed by: Daleen Bo  Nursing Notes Reviewed/ Care Coordinated Applicable Imaging Reviewed Interpretation of Laboratory Data incorporated into ED treatment  The patient appears reasonably screened and/or stabilized for discharge and I doubt any other medical condition or other [facility] requiring further screening, evaluation, or treatment in the ED at this time prior to discharge.  Plan: Home Medications-OTC analgesia of choice; Home Treatments-low-fat diet; return here if the recommended treatment, does not improve the symptoms; Recommended follow up-GI follow-up, consider HIDA scanning   Final Clinical Impressions(s) / ED Diagnoses   Final diagnoses:  Right upper quadrant abdominal pain    ED Discharge Orders         Ordered    HYDROcodone-acetaminophen (NORCO) 5-325 MG tablet  Every 4 hours PRN     11/14/18 1808           Daleen Bo, MD 11/14/18 1816

## 2018-11-14 NOTE — ED Triage Notes (Signed)
Patient complains of abdominal pain. States she has history of gallbladder pain and was referred to GI specialist, but did not follow up. Patient states pain has increased in past 2 days. Patient states nausea and vomiting.

## 2018-11-15 ENCOUNTER — Telehealth: Payer: Self-pay | Admitting: Gastroenterology

## 2018-11-15 NOTE — Telephone Encounter (Signed)
OV for Monday and pt is aware

## 2018-11-15 NOTE — Telephone Encounter (Signed)
Yes

## 2018-11-15 NOTE — Telephone Encounter (Signed)
Pt seen in the ED and was recommended to follow up with [place]. Can we accept her as a new patient?

## 2018-11-20 ENCOUNTER — Encounter: Payer: Self-pay | Admitting: Gastroenterology

## 2018-11-20 ENCOUNTER — Ambulatory Visit: Payer: BLUE CROSS/BLUE SHIELD | Admitting: Gastroenterology

## 2018-11-20 ENCOUNTER — Encounter: Payer: Self-pay | Admitting: *Deleted

## 2018-11-20 VITALS — BP 155/93 | HR 98 | Temp 98.6°F | Ht 65.0 in | Wt 356.8 lb

## 2018-11-20 DIAGNOSIS — R1011 Right upper quadrant pain: Secondary | ICD-10-CM | POA: Diagnosis not present

## 2018-11-20 NOTE — Progress Notes (Signed)
Primary Care Physician:  Patient, No Pcp Per Referring Physician: [place] [facility] Primary Gastroenterologist:  Dr. Gala Romney   Chief Complaint  Patient presents with  . Abdominal Pain    ruq; also side and into back  . Nausea    HPI:   Anita Hodges is a 33 y.o. female presenting today at the request of the [facility] due to RUQ abdominal pain.    Notes RUQ pain, can be stabbing at times and debilitating. Radiates across abdomen, radiates into back and shoulder. Worse with eating. Associated nausea and vomiting. Symptoms present for several years. Becoming more frequent and worse in intensity.   US abdomen Feb 2019 with under distended gallbladder, negative stones. Normal LFTs Jan 2020. No leukocytosis. Lipase normal.     Past Medical History:  Diagnosis Date  . Allergy   . Anxiety   . Hypertension   . Obesity     Past Surgical History:  Procedure Laterality Date  . TONSILLECTOMY    . WISDOM TOOTH EXTRACTION      Current Outpatient Medications  Medication Sig Dispense Refill  . HYDROcodone-acetaminophen (NORCO) 5-325 MG tablet Take 1-2 tablets by mouth every 4 (four) hours as needed for moderate pain. 15 tablet 0  . levocetirizine (XYZAL) 5 MG tablet Take 5 mg by mouth every evening.    . montelukast (SINGULAIR) 10 MG tablet Take 10 mg by mouth at bedtime.    . sertraline (ZOLOFT) 100 MG tablet Take 1 tablet (100 mg total) by mouth daily. 30 tablet 11  . phentermine 37.5 MG capsule Take 1 capsule (37.5 mg total) by mouth every morning. (Patient not taking: Reported on 11/20/2018) 30 capsule 2   Current Facility-Administered Medications  Medication Dose Route Frequency Provider Last Rate Last Dose  . ondansetron (ZOFRAN-ODT) disintegrating tablet 8 mg  8 mg Oral Once [name], PA-C        Allergies as of 11/20/2018 - Review Complete 11/20/2018  Allergen Reaction Noted  . Codeine Other (See Comments) 04/04/2014    Family History  Problem Relation Age of Onset  .  Hypertension Mother   . Diabetes Father   . Cancer Maternal Grandfather        ?  Marland Kitchen Cancer Paternal Grandmother        ?    Social History   Socioeconomic History  . Marital status: Single    Spouse name: Not on file  . Number of children: Not on file  . Years of education: Not on file  . Highest education level: Not on file  Occupational History  . Not on file  Social Needs  . Financial resource strain: Not on file  . Food insecurity:    Worry: Not on file    Inability: Not on file  . Transportation needs:    Medical: Not on file    Non-medical: Not on file  Tobacco Use  . Smoking status: Never Smoker  . Smokeless tobacco: Never Used  Substance and Sexual Activity  . Alcohol use: No    Alcohol/week: 0.0 standard drinks  . Drug use: No  . Sexual activity: Yes    Partners: Male    Birth control/protection: I.U.D.    Comment: 1st intercourse- 89, partners- 2, current partner- 4 months   Lifestyle  . Physical activity:    Days per week: Not on file    Minutes per session: Not on file  . Stress: Not on file  Relationships  . Social connections:  Talks on phone: Not on file    Gets together: Not on file    Attends religious service: Not on file    Active member of club or organization: Not on file    Attends meetings of clubs or organizations: Not on file    Relationship status: Not on file  . Intimate partner violence:    Fear of current or ex partner: Not on file    Emotionally abused: Not on file    Physically abused: Not on file    Forced sexual activity: Not on file  Other Topics Concern  . Not on file  Social History Narrative  . Not on file    Review of Systems: Gen: Denies any fever, chills, fatigue, weight loss, lack of appetite.  CV: Denies chest pain, heart palpitations, peripheral edema, syncope.  Resp: Denies shortness of breath at rest or with exertion. Denies wheezing or cough.  GI: see HPI  GU : Denies urinary burning, urinary frequency,  urinary hesitancy MS: Denies joint pain, muscle weakness, cramps, or limitation of movement.  Derm: Denies rash, itching, dry skin Psych: Denies depression, anxiety, memory loss, and confusion Heme: Denies bruising, bleeding, and enlarged lymph nodes.  Physical Exam: BP (!) 155/93   Pulse 98   Temp 98.6 F (37 C) (Oral)   Ht 5\' 5"  (1.651 m)   Wt (!) 356 lb 12.8 oz (161.8 kg)   BMI 59.37 kg/m  General:   Alert and oriented. Pleasant and cooperative. Well-nourished and well-developed.  Head:  Normocephalic and atraumatic. Eyes:  Without icterus, sclera clear and conjunctiva pink.  Ears:  Normal auditory acuity. Nose:  No deformity, discharge,  or lesions. Mouth:  No deformity or lesions, oral mucosa pink.  Lungs:  Clear to auscultation bilaterally. No wheezes, rales, or rhonchi. No distress.  Heart:  S1, S2 present without murmurs appreciated.  Abdomen:  +BS, soft, mild TTP RUQ and non-distended. No HSM noted. No guarding or rebound. No masses appreciated.  Rectal:  Deferred  Msk:  Symmetrical without gross deformities. Normal posture. Extremities:  Without  edema. Neurologic:  Alert and  oriented x4 Psych:  Alert and cooperative. Normal mood and affect.

## 2018-11-20 NOTE — Patient Instructions (Addendum)
I have ordered a HIDA scan to further evaluate your gallbladder.   I have included a handout of foods to avoid in the meantime.  Further recommendations to follow!  It was a pleasure to see you today. I strive to create trusting relationships with patients to provide genuine, compassionate, and quality care. I value your feedback. If you receive a survey regarding your visit,  I greatly appreciate you taking time to fill this out.   Annitta Needs, PhD, ANP-BC [facility]    Gallbladder Eating Plan If you have a gallbladder condition, you may have trouble digesting fats. Eating a low-fat diet can help reduce your symptoms, and may be helpful before and after having surgery to remove your gallbladder (cholecystectomy). Your health care provider may recommend that you work with a diet and nutrition specialist (dietitian) to help you reduce the amount of fat in your diet. What are tips for following this plan? General guidelines  Limit your fat intake to less than 30% of your total daily calories. If you eat around 1,800 calories each day, this is less than 60 grams (g) of fat per day.  Fat is an important part of a healthy diet. Eating a low-fat diet can make it hard to maintain a healthy body weight. Ask your dietitian how much fat, calories, and other nutrients you need each day.  Eat small, frequent meals throughout the day instead of three large meals.  Drink at least 8-10 cups of fluid a day. Drink enough fluid to keep your urine clear or pale yellow.  Limit alcohol intake to no more than 1 drink a day for nonpregnant women and 2 drinks a day for men. One drink equals 12 oz of beer, 5 oz of wine, or 1 oz of hard liquor. Reading food labels  Check Nutrition Facts on food labels for the amount of fat per serving. Choose foods with less than 3 grams of fat per serving. Shopping  Choose nonfat and low-fat healthy foods. Look for the words "nonfat," "low fat," or "fat  free."  Avoid buying processed or prepackaged foods. Cooking  Cook using low-fat methods, such as baking, broiling, grilling, or boiling.  Cook with small amounts of healthy fats, such as olive oil, grapeseed oil, canola oil, or sunflower oil. What foods are recommended?   All fresh, frozen, or canned fruits and vegetables.  Whole grains.  Low-fat or non-fat (skim) milk and yogurt.  Lean meat, skinless poultry, fish, eggs, and beans.  Low-fat protein supplement powders or drinks.  Spices and herbs. What foods are not recommended?  High-fat foods. These include baked goods, fast food, fatty cuts of meat, ice cream, french toast, sweet rolls, pizza, cheese bread, foods covered with butter, creamy sauces, or cheese.  Fried foods. These include french fries, tempura, battered fish, breaded chicken, fried breads, and sweets.  Foods with strong odors.  Foods that cause bloating and gas. Summary  A low-fat diet can be helpful if you have a gallbladder condition, or before and after gallbladder surgery.  Limit your fat intake to less than 30% of your total daily calories. This is about 60 g of fat if you eat 1,800 calories each day.  Eat small, frequent meals throughout the day instead of three large meals. This information is not intended to replace advice given to you by your health care provider. Make sure you discuss any questions you have with your health care provider. Document Released: 10/30/2013 Document Revised: 12/02/2016 Document Reviewed: 12/02/2016  Chartered certified accountant Patient Education  [employer].

## 2018-11-20 NOTE — H&P (View-Only) (Signed)
Primary Care Physician:  Patient, No Pcp Per Referring Physician: [facility] Primary Gastroenterologist:  Dr. Gala Romney   Chief Complaint  Patient presents with  . Abdominal Pain    ruq; also side and into back  . Nausea    HPI:   Anita Hodges is a 33 y.o. female presenting today at the request of the [facility] due to RUQ abdominal pain.    Notes RUQ pain, can be stabbing at times and debilitating. Radiates across abdomen, radiates into back and shoulder. Worse with eating. Associated nausea and vomiting. Symptoms present for several years. Becoming more frequent and worse in intensity.   US abdomen Feb 2019 with under distended gallbladder, negative stones. Normal LFTs Jan 2020. No leukocytosis. Lipase normal.     Past Medical History:  Diagnosis Date  . Allergy   . Anxiety   . Hypertension   . Obesity     Past Surgical History:  Procedure Laterality Date  . TONSILLECTOMY    . WISDOM TOOTH EXTRACTION      Current Outpatient Medications  Medication Sig Dispense Refill  . HYDROcodone-acetaminophen (NORCO) 5-325 MG tablet Take 1-2 tablets by mouth every 4 (four) hours as needed for moderate pain. 15 tablet 0  . levocetirizine (XYZAL) 5 MG tablet Take 5 mg by mouth every evening.    . montelukast (SINGULAIR) 10 MG tablet Take 10 mg by mouth at bedtime.    . sertraline (ZOLOFT) 100 MG tablet Take 1 tablet (100 mg total) by mouth daily. 30 tablet 11  . phentermine 37.5 MG capsule Take 1 capsule (37.5 mg total) by mouth every morning. (Patient not taking: Reported on 11/20/2018) 30 capsule 2   Current Facility-Administered Medications  Medication Dose Route Frequency Provider Last Rate Last Dose  . ondansetron (ZOFRAN-ODT) disintegrating tablet 8 mg  8 mg Oral Once [name], PA-C        Allergies as of 11/20/2018 - Review Complete 11/20/2018  Allergen Reaction Noted  . Codeine Other (See Comments) 04/04/2014    Family History  Problem Relation Age of Onset  .  Hypertension Mother   . Diabetes Father   . Cancer Maternal Grandfather        ?  Marland Kitchen Cancer Paternal Grandmother        ?    Social History   Socioeconomic History  . Marital status: Single    Spouse name: Not on file  . Number of children: Not on file  . Years of education: Not on file  . Highest education level: Not on file  Occupational History  . Not on file  Social Needs  . Financial resource strain: Not on file  . Food insecurity:    Worry: Not on file    Inability: Not on file  . Transportation needs:    Medical: Not on file    Non-medical: Not on file  Tobacco Use  . Smoking status: Never Smoker  . Smokeless tobacco: Never Used  Substance and Sexual Activity  . Alcohol use: No    Alcohol/week: 0.0 standard drinks  . Drug use: No  . Sexual activity: Yes    Partners: Male    Birth control/protection: I.U.D.    Comment: 1st intercourse- 55, partners- 62, current partner- 4 months   Lifestyle  . Physical activity:    Days per week: Not on file    Minutes per session: Not on file  . Stress: Not on file  Relationships  . Social connections:  Talks on phone: Not on file    Gets together: Not on file    Attends religious service: Not on file    Active member of club or organization: Not on file    Attends meetings of clubs or organizations: Not on file    Relationship status: Not on file  . Intimate partner violence:    Fear of current or ex partner: Not on file    Emotionally abused: Not on file    Physically abused: Not on file    Forced sexual activity: Not on file  Other Topics Concern  . Not on file  Social History Narrative  . Not on file    Review of Systems: Gen: Denies any fever, chills, fatigue, weight loss, lack of appetite.  CV: Denies chest pain, heart palpitations, peripheral edema, syncope.  Resp: Denies shortness of breath at rest or with exertion. Denies wheezing or cough.  GI: see HPI  GU : Denies urinary burning, urinary frequency,  urinary hesitancy MS: Denies joint pain, muscle weakness, cramps, or limitation of movement.  Derm: Denies rash, itching, dry skin Psych: Denies depression, anxiety, memory loss, and confusion Heme: Denies bruising, bleeding, and enlarged lymph nodes.  Physical Exam: BP (!) 155/93   Pulse 98   Temp 98.6 F (37 C) (Oral)   Ht 5\' 5"  (1.651 m)   Wt (!) 356 lb 12.8 oz (161.8 kg)   BMI 59.37 kg/m  General:   Alert and oriented. Pleasant and cooperative. Well-nourished and well-developed.  Head:  Normocephalic and atraumatic. Eyes:  Without icterus, sclera clear and conjunctiva pink.  Ears:  Normal auditory acuity. Nose:  No deformity, discharge,  or lesions. Mouth:  No deformity or lesions, oral mucosa pink.  Lungs:  Clear to auscultation bilaterally. No wheezes, rales, or rhonchi. No distress.  Heart:  S1, S2 present without murmurs appreciated.  Abdomen:  +BS, soft, mild TTP RUQ and non-distended. No HSM noted. No guarding or rebound. No masses appreciated.  Rectal:  Deferred  Msk:  Symmetrical without gross deformities. Normal posture. Extremities:  Without  edema. Neurologic:  Alert and  oriented x4 Psych:  Alert and cooperative. Normal mood and affect.

## 2018-11-23 ENCOUNTER — Ambulatory Visit (HOSPITAL_COMMUNITY)
Admission: RE | Admit: 2018-11-23 | Discharge: 2018-11-23 | Disposition: A | Discharge status: Home / Self Care | Payer: BLUE CROSS/BLUE SHIELD | Source: Ambulatory Visit | Attending: Gastroenterology | Admitting: Gastroenterology

## 2018-11-23 ENCOUNTER — Encounter (HOSPITAL_COMMUNITY): Payer: Self-pay

## 2018-11-23 DIAGNOSIS — R1011 Right upper quadrant pain: Secondary | ICD-10-CM | POA: Diagnosis not present

## 2018-11-23 DIAGNOSIS — R112 Nausea with vomiting, unspecified: Secondary | ICD-10-CM | POA: Diagnosis not present

## 2018-11-23 MED ORDER — TECHNETIUM TC 99M MEBROFENIN IV KIT
5.0000 | PACK | Freq: Once | INTRAVENOUS | Status: AC | PRN
  Administered 2018-11-23: 5.15 via INTRAVENOUS

## 2018-11-27 NOTE — Assessment & Plan Note (Addendum)
33 year old female with several year history of RUQ pain, worsening postprandially with associated nausea and vomiting. Worsening in frequency and intensity. US abdomen Feb 2019 without stones. Normal LFTs, CBC, lipase recently. Will pursue HIDA in the near future and anticipate Gen Surg referral. In interim, avoid high fat foods, greasy, etc. Diet sheet provided.   Addendum 12/14/2018: HIDA scan with normal EF but reproducible symptoms. CT unrevealing. She has seen Dr. Arnoldo Morale, who is planning a cholecystectomy in next few weeks barring any obvious findings on EGD that we have recommended. Pursuing EGD with Dr. Gala Romney with Propofol in near future, with likely cholecystectomy to follow thereafter. Risks and benefits discussed with stated understanding. Patient is agreeable to this plan. I have started her on Protonix in the interim and recommended avoidance of greasy, fatty foods. Follow bland diet.

## 2018-11-28 ENCOUNTER — Telehealth: Payer: Self-pay | Admitting: Gastroenterology

## 2018-11-28 NOTE — Progress Notes (Signed)
Anita Hodges, your HIDA scan was normal. However, did you have symptoms of pain, N/V when you were undergoing the HIDA scan and drinking the Ensure? (sent in [place]).

## 2018-11-28 NOTE — Telephone Encounter (Signed)
Pt was calling to see if her Hida scan results were back yet. Please call [phone]

## 2018-11-28 NOTE — Progress Notes (Signed)
NO PCP PER PATIENT °

## 2018-11-29 ENCOUNTER — Telehealth: Payer: Self-pay | Admitting: Gastroenterology

## 2018-11-29 DIAGNOSIS — R109 Unspecified abdominal pain: Secondary | ICD-10-CM

## 2018-11-29 MED ORDER — ONDANSETRON HCL 4 MG PO TABS: 4.0000 mg | ORAL_TABLET | Freq: Three times a day (TID) | ORAL | 1 refills | Status: DC | PRN

## 2018-11-29 NOTE — Telephone Encounter (Signed)
Referral sent. Doris, please inform patient once you speak with her. Thanks

## 2018-11-29 NOTE — Telephone Encounter (Signed)
I have sent in Zofran for nausea.   Patient with normal HIDA but reproducible symptoms of pain, N/V during HIDA. Interestingly, she has discomfort with movement and sitting for periods of time from what she is saying now on patient messages. Labs are unrevealing recently.   Please refer to Dr. Fransisca Kaufmann for consultation regarding possible cholecystectomy. Please notate on referral that she had a normal EF but had reproducible symptoms that are predominantly posptrandial. Chronic but worsening in intensity and frequency.

## 2018-11-29 NOTE — Telephone Encounter (Signed)
See separate phone note. Pt is aware.

## 2018-11-29 NOTE — Telephone Encounter (Signed)
PT is aware of the results and the Zofran. She is also aware of the referral.

## 2018-12-01 ENCOUNTER — Other Ambulatory Visit: Payer: Self-pay | Admitting: Gastroenterology

## 2018-12-01 ENCOUNTER — Ambulatory Visit (HOSPITAL_COMMUNITY)
Admission: RE | Admit: 2018-12-01 | Discharge: 2018-12-01 | Disposition: A | Discharge status: Home / Self Care | Payer: BLUE CROSS/BLUE SHIELD | Source: Ambulatory Visit | Attending: Gastroenterology | Admitting: Gastroenterology

## 2018-12-01 ENCOUNTER — Telehealth: Payer: Self-pay | Admitting: Gastroenterology

## 2018-12-01 DIAGNOSIS — R112 Nausea with vomiting, unspecified: Secondary | ICD-10-CM

## 2018-12-01 DIAGNOSIS — R1011 Right upper quadrant pain: Secondary | ICD-10-CM | POA: Diagnosis not present

## 2018-12-01 LAB — HCG, QUANTITATIVE, PREGNANCY: hCG, Beta Chain, Quant, S: <1 m[IU]/mL (ref <5)

## 2018-12-01 MED ORDER — PANTOPRAZOLE SODIUM 40 MG PO TBEC: 40.0000 mg | DELAYED_RELEASE_TABLET | Freq: Every day | ORAL | 3 refills | Status: DC

## 2018-12-01 MED ORDER — IOPAMIDOL (ISOVUE-300) INJECTION 61%
100.0000 mL | Freq: Once | INTRAVENOUS | Status: AC | PRN
  Administered 2018-12-01: 100 mL via INTRAVENOUS

## 2018-12-01 NOTE — Telephone Encounter (Signed)
AB advised CT abdomen w/contrast. CT approved via [employer]. PA# [id number], 12/01/18-12/30/18.  Called pt, she has had water and piece of candy this morning.  Called Central Scheduling, pt can go now to radiology. Pregnancy test order faxed to [facility] lab.   Called and informed pt to go now for CT scan. Aware to go to lab first for pregnancy test and nothing to eat or drink.

## 2018-12-01 NOTE — Telephone Encounter (Signed)
[facility] clinical pool: please schedule stat CT scan for patient. Reason: RUQ pain, nausea, vomiting, negative ultrasound for stones, normal HIDA. Will need pregnancy screen prior. Please put my number for call report.

## 2018-12-05 ENCOUNTER — Telehealth: Payer: Self-pay | Admitting: Gastroenterology

## 2018-12-05 ENCOUNTER — Other Ambulatory Visit: Payer: Self-pay | Admitting: *Deleted

## 2018-12-05 DIAGNOSIS — R112 Nausea with vomiting, unspecified: Secondary | ICD-10-CM

## 2018-12-05 DIAGNOSIS — R109 Unspecified abdominal pain: Secondary | ICD-10-CM

## 2018-12-05 MED ORDER — PROMETHAZINE HCL 25 MG PO TABS: 12.5000 mg | ORAL_TABLET | Freq: Four times a day (QID) | ORAL | 0 refills | Status: AC | PRN

## 2018-12-05 NOTE — Telephone Encounter (Signed)
Called patient to discuss concerns and obtain clinical update.  She has had worsening of postprandial RUQ pain, N/V. Pain at times without eating/drinking. Difficult to work at her job due to persistent pain and nausea. Zofran helps ease nausea slightly. I sent in phenergan to take for severe nausea, 12.5 mg po every 6-8 hours. Discussed not driving. Taking Protonix once each morning, just started a few days ago. States she is eating without restrictions but finds it difficult with foods such as whole wheat bread with cheese. She has no problems with bananas, cereal with almond milk, popsicle. I have asked her to stick with a bland, low-fat diet. Avoid any cheese, greasy foods as we discussed.  [facility] clinical pool: can we tentatively put her on the schedule for 2/10 with [facility] for EGD with Propofol? I will be talking with Dr. Arnoldo Morale, too, to give him an update regarding her symptoms.   FMLA forms completed and placed up front.

## 2018-12-05 NOTE — Telephone Encounter (Signed)
MYChartt message sent to patient with instructions and pre-op scheduled for 12/13/2018 at 1:15pm.

## 2018-12-12 ENCOUNTER — Encounter: Payer: Self-pay | Admitting: General Surgery

## 2018-12-12 ENCOUNTER — Ambulatory Visit (INDEPENDENT_AMBULATORY_CARE_PROVIDER_SITE_OTHER): Payer: BLUE CROSS/BLUE SHIELD | Admitting: General Surgery

## 2018-12-12 VITALS — BP 168/104 | HR 89 | Temp 99.1°F | Resp 18 | Wt 365.2 lb

## 2018-12-12 DIAGNOSIS — K8050 Calculus of bile duct without cholangitis or cholecystitis without obstruction: Secondary | ICD-10-CM

## 2018-12-12 NOTE — Patient Instructions (Signed)
52    Your procedure is scheduled on: 12/18/2018  Report to [facility] at   11:15  AM.  Call this number if you have problems the morning of surgery: [phone]   Remember:   Do not drink or eat food:After Midnight.     Take these medicines the morning of surgery with A SIP OF WATER: Protonix, Zoloft  (Phenergan or Zofran if needed)   Do not wear jewelry, make-up or nail polish.  Do not wear lotions, powders, or perfumes. You may wear deodorant.  Do not shave 48 hours prior to surgery. Men may shave face and neck.  Do not bring valuables to the hospital.  Contacts, dentures or bridgework may not be worn into surgery.  Leave suitcase in the car. After surgery it may be brought to your room.  For patients admitted to the hospital, checkout time is 11:00 AM the day of discharge.   Patients discharged the day of surgery will not be allowed to drive home.        Endoscopy Care After Please read the instructions outlined below and refer to this sheet in the next few weeks. These discharge instructions provide you with general information on caring for yourself after you leave the hospital. Your doctor may also give you specific instructions. While your treatment has been planned according to the most current medical practices available, unavoidable complications occasionally occur. If you have any problems or questions after discharge, please call your doctor. HOME CARE INSTRUCTIONS Activity  You may resume your regular activity but move at a slower pace for the next 24 hours.   Take frequent rest periods for the next 24 hours.   Walking will help expel (get rid of) the air and reduce the bloated feeling in your abdomen.   No driving for 24 hours (because of the anesthesia (medicine) used during the test).   You may shower.   Do not sign any important legal documents or operate any machinery for 24 hours (because of the anesthesia used during the test).  Nutrition  Drink plenty  of fluids.   You may resume your normal diet.   Begin with a light meal and progress to your normal diet.   Avoid alcoholic beverages for 24 hours or as instructed by your caregiver.  Medications You may resume your normal medications unless your caregiver tells you otherwise. What you can expect today  You may experience abdominal discomfort such as a feeling of fullness or "gas" pains.   You may experience a sore throat for 2 to 3 days. This is normal. Gargling with salt water may help this.  Follow-up Your doctor will discuss the results of your test with you. SEEK IMMEDIATE MEDICAL CARE IF:  You have excessive nausea (feeling sick to your stomach) and/or vomiting.   You have severe abdominal pain and distention (swelling).   You have trouble swallowing.   You have a temperature over 100 F (37.8 C).   You have rectal bleeding or vomiting of blood.  Document Released: 06/08/2004 Document Revised: 10/14/2011 Document Reviewed: 12/20/2007

## 2018-12-12 NOTE — Patient Instructions (Signed)
Laparoscopic Cholecystectomy Laparoscopic cholecystectomy is surgery to remove the gallbladder. The gallbladder is a pear-shaped organ that lies beneath the liver on the right side of the body. The gallbladder stores bile, which is a fluid that helps the body to digest fats. Cholecystectomy is often done for inflammation of the gallbladder (cholecystitis). This condition is usually caused by a buildup of gallstones (cholelithiasis) in the gallbladder. Gallstones can block the flow of bile, which can result in inflammation and pain. In severe cases, emergency surgery may be required. This procedure is done though small incisions in your abdomen (laparoscopic surgery). A thin scope with a camera (laparoscope) is inserted through one incision. Thin surgical instruments are inserted through the other incisions. In some cases, a laparoscopic procedure may be turned into a type of surgery that is done through a larger incision (open surgery). Tell a health care provider about:  Any allergies you have.  All medicines you are taking, including vitamins, herbs, eye drops, creams, and over-the-counter medicines.  Any problems you or family members have had with anesthetic medicines.  Any blood disorders you have.  Any surgeries you have had.  Any medical conditions you have.  Whether you are pregnant or may be pregnant. What are the risks? Generally, this is a safe procedure. However, problems may occur, including:  Infection.  Bleeding.  Allergic reactions to medicines.  Damage to other structures or organs.  A stone remaining in the common bile duct. The common bile duct carries bile from the gallbladder into the small intestine.  A bile leak from the cyst duct that is clipped when your gallbladder is removed. What happens before the procedure?   Medicines  Ask your health care provider about: ? Changing or stopping your regular medicines. This is especially important if you are taking  diabetes medicines or blood thinners. ? Taking medicines such as aspirin and ibuprofen. These medicines can thin your blood. Do not take these medicines before your procedure if your health care provider instructs you not to.  You may be given antibiotic medicine to help prevent infection. General instructions  Let your health care provider know if you develop a cold or an infection before surgery.  Plan to have someone take you home from the hospital or clinic.  Ask your health care provider how your surgical site will be marked or identified. What happens during the procedure?   To reduce your risk of infection: ? Your health care team will wash or sanitize their hands. ? Your skin will be washed with soap. ? Hair may be removed from the surgical area.  An IV tube may be inserted into one of your veins.  You will be given one or more of the following: ? A medicine to help you relax (sedative). ? A medicine to make you fall asleep (general anesthetic).  A breathing tube will be placed in your mouth.  Your surgeon will make several small cuts (incisions) in your abdomen.  The laparoscope will be inserted through one of the small incisions. The camera on the laparoscope will send images to a TV screen (monitor) in the operating room. This lets your surgeon see inside your abdomen.  Air-like gas will be pumped into your abdomen. This will expand your abdomen to give the surgeon more room to perform the surgery.  Other tools that are needed for the procedure will be inserted through the other incisions. The gallbladder will be removed through one of the incisions.  Your common bile duct   may be examined. If stones are found in the common bile duct, they may be removed.  After your gallbladder has been removed, the incisions will be closed with stitches (sutures), staples, or skin glue.  Your incisions may be covered with a bandage (dressing). The procedure may vary among health  care providers and hospitals. What happens after the procedure?  Your blood pressure, heart rate, breathing rate, and blood oxygen level will be monitored until the medicines you were given have worn off.  You will be given medicines as needed to control your pain.  Do not drive for 24 hours if you were given a sedative. This information is not intended to replace advice given to you by your health care provider. Make sure you discuss any questions you have with your health care provider. Document Released: 10/25/2005 Document Revised: 09/22/2017 Document Reviewed: 04/12/2016 Elsevier Interactive Patient Education  2019 [employer]  

## 2018-12-13 ENCOUNTER — Encounter (HOSPITAL_COMMUNITY)
Admission: RE | Admit: 2018-12-13 | Discharge: 2018-12-13 | Disposition: A | Discharge status: Home / Self Care | Payer: BLUE CROSS/BLUE SHIELD | Source: Ambulatory Visit | Attending: Internal Medicine | Admitting: Internal Medicine

## 2018-12-13 ENCOUNTER — Encounter (HOSPITAL_COMMUNITY): Payer: Self-pay

## 2018-12-13 ENCOUNTER — Other Ambulatory Visit: Payer: Self-pay

## 2018-12-13 DIAGNOSIS — Z01812 Encounter for preprocedural laboratory examination: Secondary | ICD-10-CM | POA: Diagnosis not present

## 2018-12-13 LAB — HCG, SERUM, QUALITATIVE: Preg, Serum: NEGATIVE

## 2018-12-13 LAB — CBC WITH DIFFERENTIAL/PLATELET
Abs Immature Granulocytes: 0.04 10*3/uL (ref 0.00–0.07)
Basophils Absolute: 0.1 10*3/uL (ref 0.0–0.1)
Basophils Relative: 1 %
Eosinophils Absolute: 0.1 10*3/uL (ref 0.0–0.5)
Eosinophils Relative: 1 %
HCT: 41.4 % (ref 36.0–46.0)
Hemoglobin: 13.2 g/dL (ref 12.0–15.0)
Immature Granulocytes: 0 %
Lymphocytes Relative: 25 %
Lymphs Abs: 2.7 10*3/uL (ref 0.7–4.0)
MCH: 27.4 pg (ref 26.0–34.0)
MCHC: 31.9 g/dL (ref 30.0–36.0)
MCV: 86.1 fL (ref 80.0–100.0)
Monocytes Absolute: 0.8 10*3/uL (ref 0.1–1.0)
Monocytes Relative: 8 %
Neutro Abs: 7 10*3/uL (ref 1.7–7.7)
Neutrophils Relative %: 65 %
Platelets: 411 10*3/uL — ABNORMAL HIGH (ref 150–400)
RBC: 4.81 MIL/uL (ref 3.87–5.11)
RDW: 13.8 % (ref 11.5–15.5)
WBC: 10.8 10*3/uL — ABNORMAL HIGH (ref 4.0–10.5)
nRBC: 0 % (ref 0.0–0.2)

## 2018-12-13 LAB — COMPREHENSIVE METABOLIC PANEL
ALT: 21 U/L (ref 0–44)
AST: 17 U/L (ref 15–41)
Albumin: 3.7 g/dL (ref 3.5–5.0)
Alkaline Phosphatase: 74 U/L (ref 38–126)
Anion gap: 7 (ref 5–15)
BUN: 11 mg/dL (ref 6–20)
CO2: 24 mmol/L (ref 22–32)
Calcium: 8.5 mg/dL — ABNORMAL LOW (ref 8.9–10.3)
Chloride: 102 mmol/L (ref 98–111)
Creatinine, Ser: 0.61 mg/dL (ref 0.44–1.00)
GFR calc Af Amer: >60 mL/min (ref >60)
GFR calc non Af Amer: >60 mL/min (ref >60)
Glucose, Bld: 82 mg/dL (ref 70–99)
Potassium: 3.9 mmol/L (ref 3.5–5.1)
Sodium: 133 mmol/L — ABNORMAL LOW (ref 135–145)
Total Bilirubin: 0.3 mg/dL (ref 0.3–1.2)
Total Protein: 7.1 g/dL (ref 6.5–8.1)

## 2018-12-13 NOTE — H&P (Signed)
Anita Hodges; [id number]; 12-26-1985   HPI Patient is a 33 year old white female who was referred to my care from [facility] of gastroenterology for evaluation treatment of persistent right upper quadrant abdominal pain and nausea.  Patient has had an extensive work-up including ultrasound the gallbladder which was negative and a HIDA scan which was unremarkable.  Her pain was reproduced with a fatty meal during her HIDA scan.  She states that she develops right upper quadrant abdominal discomfort with bloating and nausea.  This seems to be increasing in frequency and intensity.  It is associated with foods.  She denies any fever, chills, or jaundice.  She states her pain is 8 out of 10 in the abdomen when these episodes occur. Past Medical History:  Diagnosis Date  . Allergy   . Anxiety   . Hypertension   . Obesity     Past Surgical History:  Procedure Laterality Date  . TONSILLECTOMY    . WISDOM TOOTH EXTRACTION      Family History  Problem Relation Age of Onset  . Hypertension Mother   . Colon polyps Mother        unknown age of onset  . Gallbladder disease Mother   . Diabetes Father   . Gallbladder disease Father   . Cancer Maternal Grandfather        ?  Marland Kitchen Cancer Paternal Grandmother        ?  . Colon cancer Neg Hx     Current Outpatient Medications on File Prior to Visit  Medication Sig Dispense Refill  . levocetirizine (XYZAL) 5 MG tablet Take 5 mg by mouth every morning.     . montelukast (SINGULAIR) 10 MG tablet Take 10 mg by mouth at bedtime.    . pantoprazole (PROTONIX) 40 MG tablet Take 1 tablet (40 mg total) by mouth daily. 30 minutes before breakfast 30 tablet 3  . promethazine (PHENERGAN) 25 MG tablet Take 0.5 tablets (12.5 mg total) by mouth every 6 (six) hours as needed for nausea or vomiting. 30 tablet 0  . sertraline (ZOLOFT) 100 MG tablet Take 1 tablet (100 mg total) by mouth daily. 30 tablet 11   Current Facility-Administered Medications on File Prior  to Visit  Medication Dose Route Frequency Provider Last Rate Last Dose  . ondansetron (ZOFRAN-ODT) disintegrating tablet 8 mg  8 mg Oral Once [name], PA-C        Allergies  Allergen Reactions  . Codeine Other (See Comments)    DIZZINESS AND COLD (SICK)    Social History   Substance and Sexual Activity  Alcohol Use No  . Alcohol/week: 0.0 standard drinks    Social History   Tobacco Use  Smoking Status Never Smoker  Smokeless Tobacco Never Used    Review of Systems  Constitutional: Positive for malaise/fatigue.  HENT: Positive for sinus pain.   Eyes: Negative.   Respiratory: Positive for shortness of breath.   Cardiovascular: Negative.   Gastrointestinal: Positive for abdominal pain, heartburn, nausea and vomiting.  Genitourinary: Negative.   Musculoskeletal: Positive for back pain.  Skin: Negative.   Neurological: Negative.   Endo/Heme/Allergies: Negative.   Psychiatric/Behavioral: Negative.     Objective   Vitals:   12/12/18 1259  BP: (!) 168/104  Pulse: 89  Resp: 18  Temp: 99.1 F (37.3 C)    Physical Exam Vitals signs reviewed.  Constitutional:      Appearance: She is well-developed. She is obese. She is not ill-appearing.  HENT:  Head: Normocephalic and atraumatic.  Eyes:     General: No scleral icterus. Cardiovascular:     Rate and Rhythm: Normal rate and regular rhythm.     Heart sounds: Normal heart sounds. No murmur. No friction rub. No gallop.   Pulmonary:     Effort: Pulmonary effort is normal. No respiratory distress.     Breath sounds: Normal breath sounds. No stridor. No wheezing, rhonchi or rales.  Abdominal:     General: Abdomen is protuberant. Bowel sounds are normal. There is no distension.     Palpations: Abdomen is soft.     Tenderness: There is abdominal tenderness. There is no guarding. Positive signs include Murphy's sign. Negative signs include McBurney's sign.     Hernia: No hernia is present.  Skin:    General:  Skin is warm and dry.  Neurological:     Mental Status: She is alert and oriented to person, place, and time.   GI notes, ultrasound report, HIDA scan report all reviewed  Assessment  Right upper quadrant abdominal pain, biliary colic Plan   Patient is undergoing EGD next week by Dr. Gala Romney.  Unless that shows significant findings, will proceed with laparoscopic cholecystectomy both as a diagnostic and therapeutic procedure.  The risks and benefits of the procedure including bleeding, infection, hepatobiliary injury, the possibility of an open procedure, and the possibility of recurrence of her symptoms were fully explained to the patient, who gave informed consent.

## 2018-12-13 NOTE — Progress Notes (Signed)
Anita Hodges; [id number]; [birth year]   HPI Patient is a 33 year old white female who was referred to my care from [facility] of gastroenterology for evaluation treatment of persistent right upper quadrant abdominal pain and nausea.  Patient has had an extensive work-up including ultrasound the gallbladder which was negative and a HIDA scan which was unremarkable.  Her pain was reproduced with a fatty meal during her HIDA scan.  She states that she develops right upper quadrant abdominal discomfort with bloating and nausea.  This seems to be increasing in frequency and intensity.  It is associated with foods.  She denies any fever, chills, or jaundice.  She states her pain is 8 out of 10 in the abdomen when these episodes occur. Past Medical History:  Diagnosis Date  . Allergy   . Anxiety   . Hypertension   . Obesity     Past Surgical History:  Procedure Laterality Date  . TONSILLECTOMY    . WISDOM TOOTH EXTRACTION      Family History  Problem Relation Age of Onset  . Hypertension Mother   . Colon polyps Mother        unknown age of onset  . Gallbladder disease Mother   . Diabetes Father   . Gallbladder disease Father   . Cancer Maternal Grandfather        ?  Marland Kitchen Cancer Paternal Grandmother        ?  . Colon cancer Neg Hx     Current Outpatient Medications on File Prior to Visit  Medication Sig Dispense Refill  . levocetirizine (XYZAL) 5 MG tablet Take 5 mg by mouth every morning.     . montelukast (SINGULAIR) 10 MG tablet Take 10 mg by mouth at bedtime.    . pantoprazole (PROTONIX) 40 MG tablet Take 1 tablet (40 mg total) by mouth daily. 30 minutes before breakfast 30 tablet 3  . promethazine (PHENERGAN) 25 MG tablet Take 0.5 tablets (12.5 mg total) by mouth every 6 (six) hours as needed for nausea or vomiting. 30 tablet 0  . sertraline (ZOLOFT) 100 MG tablet Take 1 tablet (100 mg total) by mouth daily. 30 tablet 11   Current Facility-Administered Medications on File Prior  to Visit  Medication Dose Route Frequency Provider Last Rate Last Dose  . ondansetron (ZOFRAN-ODT) disintegrating tablet 8 mg  8 mg Oral Once [name], PA-C        Allergies  Allergen Reactions  . Codeine Other (See Comments)    DIZZINESS AND COLD (SICK)    Social History   Substance and Sexual Activity  Alcohol Use No  . Alcohol/week: 0.0 standard drinks    Social History   Tobacco Use  Smoking Status Never Smoker  Smokeless Tobacco Never Used    Review of Systems  Constitutional: Positive for malaise/fatigue.  HENT: Positive for sinus pain.   Eyes: Negative.   Respiratory: Positive for shortness of breath.   Cardiovascular: Negative.   Gastrointestinal: Positive for abdominal pain, heartburn, nausea and vomiting.  Genitourinary: Negative.   Musculoskeletal: Positive for back pain.  Skin: Negative.   Neurological: Negative.   Endo/Heme/Allergies: Negative.   Psychiatric/Behavioral: Negative.     Objective   Vitals:   12/12/18 1259  BP: (!) 168/104  Pulse: 89  Resp: 18  Temp: 99.1 F (37.3 C)    Physical Exam Vitals signs reviewed.  Constitutional:      Appearance: She is well-developed. She is obese. She is not ill-appearing.  HENT:  Head: Normocephalic and atraumatic.  Eyes:     General: No scleral icterus. Cardiovascular:     Rate and Rhythm: Normal rate and regular rhythm.     Heart sounds: Normal heart sounds. No murmur. No friction rub. No gallop.   Pulmonary:     Effort: Pulmonary effort is normal. No respiratory distress.     Breath sounds: Normal breath sounds. No stridor. No wheezing, rhonchi or rales.  Abdominal:     General: Abdomen is protuberant. Bowel sounds are normal. There is no distension.     Palpations: Abdomen is soft.     Tenderness: There is abdominal tenderness. There is no guarding. Positive signs include Murphy's sign. Negative signs include McBurney's sign.     Hernia: No hernia is present.  Skin:    General:  Skin is warm and dry.  Neurological:     Mental Status: She is alert and oriented to person, place, and time.   GI notes, ultrasound report, HIDA scan report all reviewed  Assessment  Right upper quadrant abdominal pain, biliary colic Plan   Patient is undergoing EGD next week by Dr. Gala Romney.  Unless that shows significant findings, will proceed with laparoscopic cholecystectomy both as a diagnostic and therapeutic procedure.  The risks and benefits of the procedure including bleeding, infection, hepatobiliary injury, the possibility of an open procedure, and the possibility of recurrence of her symptoms were fully explained to the patient, who gave informed consent.

## 2018-12-18 ENCOUNTER — Ambulatory Visit (HOSPITAL_COMMUNITY)
Admission: RE | Admit: 2018-12-18 | Discharge: 2018-12-18 | Disposition: A | Discharge status: Home / Self Care | Payer: BLUE CROSS/BLUE SHIELD | Attending: Internal Medicine | Admitting: Internal Medicine

## 2018-12-18 ENCOUNTER — Ambulatory Visit (HOSPITAL_COMMUNITY): Payer: BLUE CROSS/BLUE SHIELD | Admitting: Anesthesiology

## 2018-12-18 ENCOUNTER — Encounter (HOSPITAL_COMMUNITY)
Admission: RE | Disposition: A | Discharge status: Home / Self Care | Payer: Self-pay | Source: Home / Self Care | Attending: Internal Medicine

## 2018-12-18 ENCOUNTER — Encounter (HOSPITAL_COMMUNITY): Payer: Self-pay | Admitting: *Deleted

## 2018-12-18 ENCOUNTER — Telehealth: Payer: Self-pay

## 2018-12-18 DIAGNOSIS — Z79899 Other long term (current) drug therapy: Secondary | ICD-10-CM | POA: Diagnosis not present

## 2018-12-18 DIAGNOSIS — F419 Anxiety disorder, unspecified: Secondary | ICD-10-CM | POA: Insufficient documentation

## 2018-12-18 DIAGNOSIS — Z6841 Body Mass Index (BMI) 40.0 and over, adult: Secondary | ICD-10-CM | POA: Insufficient documentation

## 2018-12-18 DIAGNOSIS — I10 Essential (primary) hypertension: Secondary | ICD-10-CM | POA: Diagnosis not present

## 2018-12-18 DIAGNOSIS — R1011 Right upper quadrant pain: Secondary | ICD-10-CM

## 2018-12-18 DIAGNOSIS — R109 Unspecified abdominal pain: Secondary | ICD-10-CM

## 2018-12-18 DIAGNOSIS — E669 Obesity, unspecified: Secondary | ICD-10-CM | POA: Diagnosis not present

## 2018-12-18 DIAGNOSIS — K209 Esophagitis, unspecified: Secondary | ICD-10-CM

## 2018-12-18 DIAGNOSIS — R112 Nausea with vomiting, unspecified: Secondary | ICD-10-CM

## 2018-12-18 HISTORY — PX: ESOPHAGOGASTRODUODENOSCOPY (EGD) WITH PROPOFOL: SHX5813

## 2018-12-18 SURGERY — ESOPHAGOGASTRODUODENOSCOPY (EGD) WITH PROPOFOL
Anesthesia: Monitor Anesthesia Care

## 2018-12-18 MED ORDER — CHLORHEXIDINE GLUCONATE CLOTH 2 % EX PADS: 6.0000 | MEDICATED_PAD | Freq: Once | CUTANEOUS | Status: DC

## 2018-12-18 MED ORDER — PROMETHAZINE HCL 25 MG/ML IJ SOLN: 6.2500 mg | INTRAMUSCULAR | Status: DC | PRN

## 2018-12-18 MED ORDER — PROPOFOL 500 MG/50ML IV EMUL
INTRAVENOUS | Status: DC | PRN
  Administered 2018-12-18: 200 ug/kg/min via INTRAVENOUS

## 2018-12-18 MED ORDER — PROPOFOL 10 MG/ML IV BOLUS
INTRAVENOUS | Status: DC | PRN
  Administered 2018-12-18: 80 mg via INTRAVENOUS
  Administered 2018-12-18: 40 mg via INTRAVENOUS

## 2018-12-18 MED ORDER — LACTATED RINGERS IV SOLN: INTRAVENOUS | Status: DC

## 2018-12-18 MED ORDER — MEPERIDINE HCL 100 MG/ML IJ SOLN: 6.2500 mg | INTRAMUSCULAR | Status: DC | PRN

## 2018-12-18 MED ORDER — LACTATED RINGERS IV SOLN
INTRAVENOUS | Status: DC
  Administered 2018-12-18: 13:00:00 via INTRAVENOUS

## 2018-12-18 NOTE — Anesthesia Preprocedure Evaluation (Signed)
Anesthesia Evaluation    Airway Mallampati: II       Dental  (+) Teeth Intact   Pulmonary           Cardiovascular hypertension, On Medications  Rhythm:regular     Neuro/Psych Anxiety    GI/Hepatic   Endo/Other    Renal/GU      Musculoskeletal   Abdominal   Peds  Hematology   Anesthesia Other Findings Morbid obesity with significantly decreased BS   Reproductive/Obstetrics                             Anesthesia Physical Anesthesia Plan  ASA: III  Anesthesia Plan: MAC   Post-op Pain Management:    Induction:   PONV Risk Score and Plan:   Airway Management Planned:   Additional Equipment:   Intra-op Plan:   Post-operative Plan:   Informed Consent: I have reviewed the patients History and Physical, chart, labs and discussed the procedure including the risks, benefits and alternatives for the proposed anesthesia with the patient or authorized representative who has indicated his/her understanding and acceptance.       Plan Discussed with: Anesthesiologist  Anesthesia Plan Comments:         Anesthesia Quick Evaluation

## 2018-12-18 NOTE — Discharge Instructions (Signed)
EGD Discharge instructions Please read the instructions outlined below and refer to this sheet in the next few weeks. These discharge instructions provide you with general information on caring for yourself after you leave the hospital. Your doctor may also give you specific instructions. While your treatment has been planned according to the most current medical practices available, unavoidable complications occasionally occur. If you have any problems or questions after discharge, please call your doctor. ACTIVITY  You may resume your regular activity but move at a slower pace for the next 24 hours.   Take frequent rest periods for the next 24 hours.   Walking will help expel (get rid of) the air and reduce the bloated feeling in your abdomen.   No driving for 24 hours (because of the anesthesia (medicine) used during the test).   You may shower.   Do not sign any important legal documents or operate any machinery for 24 hours (because of the anesthesia used during the test).  NUTRITION  Drink plenty of fluids.   You may resume your normal diet.   Begin with a light meal and progress to your normal diet.   Avoid alcoholic beverages for 24 hours or as instructed by your caregiver.  MEDICATIONS  You may resume your normal medications unless your caregiver tells you otherwise.  WHAT YOU CAN EXPECT TODAY  You may experience abdominal discomfort such as a feeling of fullness or gas pains.  FOLLOW-UP  Your doctor will discuss the results of your test with you.  SEEK IMMEDIATE MEDICAL ATTENTION IF ANY OF THE FOLLOWING OCCUR:  Excessive nausea (feeling sick to your stomach) and/or vomiting.   Severe abdominal pain and distention (swelling).   Trouble swallowing.   Temperature over 101 F (37.8 C).   Rectal bleeding or vomiting of blood.    GERD information provided  Trial of Dexilant 60 mg daily-go by my office for 3-week supply of samples.  Take this paper with you and  show it to the receptionist  If this medication helps your pain, you may not need your gallbladder out.  If it makes no difference, would proceed with seeing Dr. Arnoldo Morale.

## 2018-12-18 NOTE — Anesthesia Postprocedure Evaluation (Signed)
Anesthesia Post Note  Patient: Anita Hodges  Procedure(s) Performed: ESOPHAGOGASTRODUODENOSCOPY (EGD) WITH PROPOFOL (N/A )  Patient location during evaluation: PACU Anesthesia Type: MAC Level of consciousness: awake and alert and patient cooperative Pain management: satisfactory to patient Vital Signs Assessment: post-procedure vital signs reviewed and stable Respiratory status: spontaneous breathing Cardiovascular status: stable Postop Assessment: no apparent nausea or vomiting Anesthetic complications: no     Last Vitals:  Vitals:   12/18/18 1144  BP: 140/79  Pulse: 82  Resp: (!) 24  Temp: 36.9 C  SpO2: 98%    Last Pain:  Vitals:   12/18/18 1245  TempSrc:   PainSc: 0-No pain                 Oretha Weismann

## 2018-12-18 NOTE — Transfer of Care (Signed)
Immediate Anesthesia Transfer of Care Note  Patient: Anita Hodges  Procedure(s) Performed: ESOPHAGOGASTRODUODENOSCOPY (EGD) WITH PROPOFOL (N/A )  Patient Location: PACU  Anesthesia Type:MAC  Level of Consciousness: awake, alert  and patient cooperative  Airway & Oxygen Therapy: Patient Spontanous Breathing  Post-op Assessment: Report given to RN and Post -op Vital signs reviewed and stable  Post vital signs: Reviewed and stable  Last Vitals:  Vitals Value Taken Time  BP    Temp    Pulse 89 12/18/2018  1:00 PM  Resp    SpO2 79 % 12/18/2018  1:00 PM  Vitals shown include unvalidated device data.  Last Pain:  Vitals:   12/18/18 1245  TempSrc:   PainSc: 0-No pain      Patients Stated Pain Goal: 8 (08/24/50 0258)  Complications: No apparent anesthesia complications

## 2018-12-18 NOTE — Interval H&P Note (Signed)
History and Physical Interval Note:  12/18/2018 12:41 PM  Anita Hodges  has presented today for surgery, with the diagnosis of nausea/vomiting, abd pain  The various methods of treatment have been discussed with the patient and family. After consideration of risks, benefits and other options for treatment, the patient has consented to  Procedure(s) with comments: ESOPHAGOGASTRODUODENOSCOPY (EGD) WITH PROPOFOL (N/A) - 12:45pm as a surgical intervention .  The patient's history has been reviewed, patient examined, no change in status, stable for surgery.  I have reviewed the patient's chart and labs.  Questions were answered to the patient's satisfaction.     Anita Hodges  No change.  Patient denies dysphagia.  Diagnostic EGD per plan.  The risks, benefits, limitations, alternatives and imponderables have been reviewed with the patient. Potential for esophageal dilation, biopsy, etc. have also been reviewed.  Questions have been answered. All parties agreeable.

## 2018-12-18 NOTE — Telephone Encounter (Signed)
Pt walked in office to get samples of Dexilant s/p her procedure today with RMR. Samples given as directed.

## 2018-12-18 NOTE — OR Nursing (Signed)
Pregnancy test negative on 12/01/2018  And 12/13/2018

## 2018-12-18 NOTE — Anesthesia Procedure Notes (Signed)
Procedure Name: MAC Date/Time: 12/18/2018 12:43 PM Performed by: Vista Deck, CRNA Pre-anesthesia Checklist: Patient identified, Emergency Drugs available, Suction available, Timeout performed and Patient being monitored Patient Re-evaluated:Patient Re-evaluated prior to induction Oxygen Delivery Method: Non-rebreather mask

## 2018-12-18 NOTE — Op Note (Signed)
[facility] Patient Name: Anita Hodges Procedure Date: 12/18/2018 12:34 PM MRN: [id number] Date of Birth: 08-Nov-1986 Attending MD: Norvel Richards , MD CSN: [id number] Age: 33 Admit Type: Outpatient Procedure:                Upper GI endoscopy Indications:              Abdominal pain in the right upper quadrant Providers:                Norvel Richards, MD, Otis Peak B. Sharon Seller, RN,                            Nelma Rothman, Technician Referring MD:             self Medicines:                Propofol per Anesthesia Complications:            No immediate complications. Estimated Blood Loss:     Estimated blood loss: none. Procedure:                Pre-Anesthesia Assessment:                           - Prior to the procedure, a History and Physical                            was performed, and patient medications and                            allergies were reviewed. The patient's tolerance of                            previous anesthesia was also reviewed. The risks                            and benefits of the procedure and the sedation                            options and risks were discussed with the patient.                            All questions were answered, and informed consent                            was obtained. Prior Anticoagulants: The patient has                            taken no previous anticoagulant or antiplatelet                            agents. ASA Grade Assessment: II - A patient with                            mild systemic disease. After reviewing the risks  and benefits, the patient was deemed in                            satisfactory condition to undergo the procedure.                           After obtaining informed consent, the endoscope was                            passed under direct vision. Throughout the                            procedure, the patient's blood pressure, pulse, and           oxygen saturations were monitored continuously. The                            [id number]-H190 ([id number]) scope was introduced through the                            and advanced to the second part of duodenum. The                            upper GI endoscopy was accomplished without                            difficulty. The patient tolerated the procedure                            well. Scope In: 12:50:02 PM Scope Out: 12:55:53 PM Total Procedure Duration: 0 hours 5 minutes 51 seconds  Findings:      Esophagitis was found. Single 4 mm linear erosions straddling the EG       junction. Esophagus otherwise appeared normal.      Minimally nodular gastric mucosa. No ulcer or infiltrating process.      The duodenal bulb and second portion of the duodenum were normal. Impression:               -Mild erosive reflux esophagitis                           - Normal stomach (minimal nodularity).                           - Normal duodenal bulb and second portion of the                            duodenum.                           - No specimens collected. Some of patient's right                            upper quadrant pain could be secondary to reflux.  However, today's findings were mild. Moderate Sedation:      Moderate (conscious) sedation was personally administered by an       anesthesia professional. The following parameters were monitored: oxygen       saturation, heart rate, blood pressure, respiratory rate, EKG, adequacy       of pulmonary ventilation, and response to care. Recommendation:           - Patient has a contact number available for                            emergencies. The signs and symptoms of potential                            delayed complications were discussed with the                            patient. Return to normal activities tomorrow.                            Written discharge instructions were provided to the                             patient.                           - Resume previous diet.                           - Continue present medications. Course of Dexilant                            60 mg daily x3 weeks. If abdominal pain not                            significantly improved, would proceed in following                            up with Dr. Arnoldo Morale for cholecystectomy.                           - No repeat upper endoscopy.                           - Return to GI office (date not yet determined). Procedure Code(s):        --- Professional ---                           [phone], Esophagogastroduodenoscopy, flexible,                            transoral; diagnostic, including collection of                            specimen(s) by brushing or washing, when performed                            (  separate procedure) Diagnosis Code(s):        --- Professional ---                           K20.9, Esophagitis, unspecified                           R10.11, Right upper quadrant pain CPT copyright 2018 [employer]. All rights reserved. The codes documented in this report are preliminary and upon coder review may  be revised to meet current compliance requirements. Cristopher Estimable. Mikiah Demond, MD Norvel Richards, MD 12/18/2018 1:09:19 PM This report has been signed electronically. Number of Addenda: 0

## 2018-12-20 ENCOUNTER — Telehealth: Payer: Self-pay | Admitting: Gastroenterology

## 2018-12-20 NOTE — Telephone Encounter (Signed)
Anita Hodges:  I need some help sorting out what patient is talking about (see MyChart message). I have asked her to call the office so we can sort this out further.

## 2018-12-21 ENCOUNTER — Encounter (HOSPITAL_COMMUNITY): Payer: Self-pay | Admitting: Internal Medicine

## 2018-12-21 ENCOUNTER — Encounter: Payer: Self-pay | Admitting: Gastroenterology

## 2018-12-21 NOTE — Progress Notes (Signed)
Letter mailed

## 2018-12-21 NOTE — Telephone Encounter (Signed)
I spoke with patient as well. I have asked her to contact her insurance to determine if she needs to resend the forms I had updated with dates.

## 2018-12-21 NOTE — Progress Notes (Signed)
Anita Hodges: can we send this letter to :[facility] office [address] [place], [place] [address]    Thanks!

## 2018-12-22 ENCOUNTER — Encounter: Payer: Self-pay | Admitting: Gastroenterology

## 2018-12-22 ENCOUNTER — Encounter (HOSPITAL_COMMUNITY): Payer: Self-pay

## 2018-12-25 ENCOUNTER — Encounter (HOSPITAL_COMMUNITY)
Admission: RE | Admit: 2018-12-25 | Discharge: 2018-12-25 | Disposition: A | Discharge status: Home / Self Care | Payer: BLUE CROSS/BLUE SHIELD | Source: Ambulatory Visit | Attending: General Surgery | Admitting: General Surgery

## 2018-12-25 NOTE — Telephone Encounter (Signed)
Letter mailed to pt. Per pts request.

## 2018-12-27 ENCOUNTER — Ambulatory Visit (HOSPITAL_COMMUNITY): Payer: BLUE CROSS/BLUE SHIELD | Admitting: Anesthesiology

## 2018-12-27 ENCOUNTER — Ambulatory Visit (HOSPITAL_COMMUNITY)
Admission: RE | Admit: 2018-12-27 | Discharge: 2018-12-27 | Disposition: A | Discharge status: Home / Self Care | Payer: BLUE CROSS/BLUE SHIELD | Attending: General Surgery | Admitting: General Surgery

## 2018-12-27 ENCOUNTER — Encounter (HOSPITAL_COMMUNITY): Payer: Self-pay

## 2018-12-27 ENCOUNTER — Encounter (HOSPITAL_COMMUNITY)
Admission: RE | Disposition: A | Discharge status: Home / Self Care | Payer: Self-pay | Source: Home / Self Care | Attending: General Surgery

## 2018-12-27 ENCOUNTER — Encounter: Payer: Self-pay | Admitting: General Surgery

## 2018-12-27 DIAGNOSIS — Z885 Allergy status to narcotic agent status: Secondary | ICD-10-CM | POA: Diagnosis not present

## 2018-12-27 DIAGNOSIS — I10 Essential (primary) hypertension: Secondary | ICD-10-CM | POA: Insufficient documentation

## 2018-12-27 DIAGNOSIS — K811 Chronic cholecystitis: Secondary | ICD-10-CM

## 2018-12-27 DIAGNOSIS — K219 Gastro-esophageal reflux disease without esophagitis: Secondary | ICD-10-CM | POA: Diagnosis not present

## 2018-12-27 DIAGNOSIS — Z79899 Other long term (current) drug therapy: Secondary | ICD-10-CM | POA: Insufficient documentation

## 2018-12-27 DIAGNOSIS — Z6841 Body Mass Index (BMI) 40.0 and over, adult: Secondary | ICD-10-CM | POA: Diagnosis not present

## 2018-12-27 DIAGNOSIS — K8020 Calculus of gallbladder without cholecystitis without obstruction: Secondary | ICD-10-CM | POA: Diagnosis not present

## 2018-12-27 DIAGNOSIS — F419 Anxiety disorder, unspecified: Secondary | ICD-10-CM | POA: Diagnosis not present

## 2018-12-27 HISTORY — PX: CHOLECYSTECTOMY: SHX55

## 2018-12-27 LAB — HCG, SERUM, QUALITATIVE: Preg, Serum: NEGATIVE

## 2018-12-27 SURGERY — LAPAROSCOPIC CHOLECYSTECTOMY
Anesthesia: General

## 2018-12-27 MED ORDER — ONDANSETRON HCL 4 MG/2ML IJ SOLN
INTRAMUSCULAR | Status: AC
  Filled 2018-12-27: qty 2

## 2018-12-27 MED ORDER — FENTANYL CITRATE (PF) 250 MCG/5ML IJ SOLN
INTRAMUSCULAR | Status: AC
  Filled 2018-12-27: qty 5

## 2018-12-27 MED ORDER — ONDANSETRON HCL 4 MG/2ML IJ SOLN
4.0000 mg | Freq: Once | INTRAMUSCULAR | Status: AC
  Administered 2018-12-27: 4 mg via INTRAVENOUS

## 2018-12-27 MED ORDER — POVIDONE-IODINE 10 % OINT PACKET
TOPICAL_OINTMENT | CUTANEOUS | Status: DC | PRN
  Administered 2018-12-27: 1 via TOPICAL

## 2018-12-27 MED ORDER — PROPOFOL 10 MG/ML IV BOLUS
INTRAVENOUS | Status: AC
  Filled 2018-12-27: qty 20

## 2018-12-27 MED ORDER — SUGAMMADEX SODIUM 200 MG/2ML IV SOLN
INTRAVENOUS | Status: AC
  Filled 2018-12-27: qty 2

## 2018-12-27 MED ORDER — SUCCINYLCHOLINE CHLORIDE 200 MG/10ML IV SOSY
PREFILLED_SYRINGE | INTRAVENOUS | Status: AC
  Filled 2018-12-27: qty 10

## 2018-12-27 MED ORDER — SUCCINYLCHOLINE CHLORIDE 20 MG/ML IJ SOLN
INTRAMUSCULAR | Status: DC | PRN
  Administered 2018-12-27: 140 mg via INTRAVENOUS

## 2018-12-27 MED ORDER — CHLORHEXIDINE GLUCONATE CLOTH 2 % EX PADS: 6.0000 | MEDICATED_PAD | Freq: Once | CUTANEOUS | Status: DC

## 2018-12-27 MED ORDER — BUPIVACAINE LIPOSOME 1.3 % IJ SUSP
INTRAMUSCULAR | Status: DC | PRN
  Administered 2018-12-27: 20 mL

## 2018-12-27 MED ORDER — SUGAMMADEX SODIUM 500 MG/5ML IV SOLN
INTRAVENOUS | Status: DC | PRN
  Administered 2018-12-27: 400 mg via INTRAVENOUS

## 2018-12-27 MED ORDER — PROMETHAZINE HCL 25 MG/ML IJ SOLN: 6.2500 mg | INTRAMUSCULAR | Status: DC | PRN

## 2018-12-27 MED ORDER — MIDAZOLAM HCL 2 MG/2ML IJ SOLN
INTRAMUSCULAR | Status: AC
  Filled 2018-12-27: qty 2

## 2018-12-27 MED ORDER — ROCURONIUM BROMIDE 100 MG/10ML IV SOLN
INTRAVENOUS | Status: DC | PRN
  Administered 2018-12-27: 40 mg via INTRAVENOUS

## 2018-12-27 MED ORDER — KETOROLAC TROMETHAMINE 30 MG/ML IJ SOLN
30.0000 mg | Freq: Once | INTRAMUSCULAR | Status: AC
  Administered 2018-12-27: 30 mg via INTRAVENOUS
  Filled 2018-12-27: qty 1

## 2018-12-27 MED ORDER — FENTANYL CITRATE (PF) 100 MCG/2ML IJ SOLN
INTRAMUSCULAR | Status: DC | PRN
  Administered 2018-12-27 (×5): 50 ug via INTRAVENOUS

## 2018-12-27 MED ORDER — OXYCODONE-ACETAMINOPHEN 7.5-325 MG PO TABS: 1.0000 | ORAL_TABLET | Freq: Four times a day (QID) | ORAL | 0 refills | Status: DC | PRN

## 2018-12-27 MED ORDER — PROPOFOL 10 MG/ML IV BOLUS
INTRAVENOUS | Status: DC | PRN
  Administered 2018-12-27: 50 mg via INTRAVENOUS
  Administered 2018-12-27: 150 mg via INTRAVENOUS

## 2018-12-27 MED ORDER — SODIUM CHLORIDE 0.9 % IR SOLN
Status: DC | PRN
  Administered 2018-12-27: 1000 mL

## 2018-12-27 MED ORDER — MIDAZOLAM HCL 2 MG/2ML IJ SOLN
INTRAMUSCULAR | Status: DC | PRN
  Administered 2018-12-27: 2 mg via INTRAVENOUS

## 2018-12-27 MED ORDER — LACTATED RINGERS IV SOLN
INTRAVENOUS | Status: DC
  Administered 2018-12-27: 07:00:00 via INTRAVENOUS

## 2018-12-27 MED ORDER — ONDANSETRON HCL 4 MG/2ML IJ SOLN
INTRAMUSCULAR | Status: DC | PRN
  Administered 2018-12-27: 4 mg via INTRAVENOUS

## 2018-12-27 MED ORDER — HYDROMORPHONE HCL 1 MG/ML IJ SOLN
0.2500 mg | INTRAMUSCULAR | Status: DC | PRN
  Administered 2018-12-27 (×2): 0.5 mg via INTRAVENOUS
  Filled 2018-12-27 (×2): qty 0.5

## 2018-12-27 MED ORDER — BUPIVACAINE LIPOSOME 1.3 % IJ SUSP
INTRAMUSCULAR | Status: AC
  Filled 2018-12-27: qty 20

## 2018-12-27 MED ORDER — HEMOSTATIC AGENTS (NO CHARGE) OPTIME
TOPICAL | Status: DC | PRN
  Administered 2018-12-27: 1 via TOPICAL

## 2018-12-27 MED ORDER — CIPROFLOXACIN IN D5W 400 MG/200ML IV SOLN
400.0000 mg | INTRAVENOUS | Status: AC
  Administered 2018-12-27: 400 mg via INTRAVENOUS
  Filled 2018-12-27: qty 200

## 2018-12-27 MED ORDER — MIDAZOLAM HCL 2 MG/2ML IJ SOLN: 0.5000 mg | Freq: Once | INTRAMUSCULAR | Status: DC | PRN

## 2018-12-27 MED ORDER — POVIDONE-IODINE 10 % EX OINT
TOPICAL_OINTMENT | CUTANEOUS | Status: AC
  Filled 2018-12-27: qty 1

## 2018-12-27 MED ORDER — OXYCODONE-ACETAMINOPHEN 7.5-325 MG PO TABS
1.0000 | ORAL_TABLET | Freq: Once | ORAL | Status: AC
  Administered 2018-12-27: 1 via ORAL
  Filled 2018-12-27: qty 1

## 2018-12-27 SURGICAL SUPPLY — 46 items
APPLIER CLIP ROT 10 11.4 M/L (STAPLE) ×2
BAG RETRIEVAL 10 (BASKET) ×1
CHLORAPREP W/TINT 26ML (MISCELLANEOUS) ×2 IMPLANT
CLIP APPLIE ROT 10 11.4 M/L (STAPLE) ×1 IMPLANT
CLOTH BEACON ORANGE TIMEOUT ST (SAFETY) ×2 IMPLANT
COVER LIGHT HANDLE STERIS (MISCELLANEOUS) ×4 IMPLANT
ELECT REM PT RETURN 9FT ADLT (ELECTROSURGICAL) ×2
ELECTRODE REM PT RTRN 9FT ADLT (ELECTROSURGICAL) ×1 IMPLANT
FILTER SMOKE EVAC LAPAROSHD (FILTER) ×2 IMPLANT
GLOVE BIOGEL PI IND STRL 6.5 (GLOVE) ×2 IMPLANT
GLOVE BIOGEL PI IND STRL 7.0 (GLOVE) ×1 IMPLANT
GLOVE BIOGEL PI INDICATOR 6.5 (GLOVE) ×2
GLOVE BIOGEL PI INDICATOR 7.0 (GLOVE) ×1
GLOVE ECLIPSE 7.0 STRL STRAW (GLOVE) ×4 IMPLANT
GLOVE SURG SS PI 7.5 STRL IVOR (GLOVE) ×2 IMPLANT
GOWN STRL REUS W/ TWL XL LVL3 (GOWN DISPOSABLE) ×1 IMPLANT
GOWN STRL REUS W/TWL LRG LVL3 (GOWN DISPOSABLE) ×4 IMPLANT
GOWN STRL REUS W/TWL XL LVL3 (GOWN DISPOSABLE) ×1
HEMOSTAT SNOW SURGICEL 2X4 (HEMOSTASIS) ×2 IMPLANT
INST SET LAPROSCOPIC AP (KITS) ×2 IMPLANT
IV NS IRRIG 3000ML ARTHROMATIC (IV SOLUTION) IMPLANT
KIT TURNOVER KIT A (KITS) ×2 IMPLANT
MANIFOLD NEPTUNE II (INSTRUMENTS) ×2 IMPLANT
NEEDLE HYPO 18GX1.5 BLUNT FILL (NEEDLE) ×2 IMPLANT
NEEDLE HYPO 22GX1.5 SAFETY (NEEDLE) ×2 IMPLANT
NEEDLE INSUFFLATION 14GA 120MM (NEEDLE) ×2 IMPLANT
NS IRRIG 1000ML POUR BTL (IV SOLUTION) ×2 IMPLANT
PACK LAP CHOLE LZT030E (CUSTOM PROCEDURE TRAY) ×2 IMPLANT
PAD ARMBOARD 7.5X6 YLW CONV (MISCELLANEOUS) ×2 IMPLANT
PENCIL HANDSWITCHING (ELECTRODE) ×2 IMPLANT
SET BASIN LINEN APH (SET/KITS/TRAYS/PACK) ×2 IMPLANT
SET TUBE IRRIG SUCTION NO TIP (IRRIGATION / IRRIGATOR) IMPLANT
SLEEVE ENDOPATH XCEL 5M (ENDOMECHANICALS) ×2 IMPLANT
SPONGE GAUZE 2X2 8PLY STRL LF (GAUZE/BANDAGES/DRESSINGS) ×8 IMPLANT
STAPLER VISISTAT (STAPLE) ×2 IMPLANT
SUT VICRYL 0 UR6 27IN ABS (SUTURE) ×2 IMPLANT
SYR 20CC LL (SYRINGE) ×2 IMPLANT
SYS BAG RETRIEVAL 10MM (BASKET) ×1
SYSTEM BAG RETRIEVAL 10MM (BASKET) ×1 IMPLANT
TAPE HYPAFIX 6X30 (GAUZE/BANDAGES/DRESSINGS) ×2 IMPLANT
TROCAR ENDO BLADELESS 11MM (ENDOMECHANICALS) ×2 IMPLANT
TROCAR XCEL NON-BLD 5MMX100MML (ENDOMECHANICALS) ×2 IMPLANT
TROCAR XCEL UNIV SLVE 11M 100M (ENDOMECHANICALS) ×2 IMPLANT
TUBE CONNECTING 12X1/4 (SUCTIONS) ×2 IMPLANT
TUBING INSUFFLATION (TUBING) ×2 IMPLANT
WARMER LAPAROSCOPE (MISCELLANEOUS) ×2 IMPLANT

## 2018-12-27 NOTE — Transfer of Care (Signed)
Immediate Anesthesia Transfer of Care Note  Patient: Anita Hodges  Procedure(s) Performed: LAPAROSCOPIC CHOLECYSTECTOMY (N/A )  Patient Location: PACU  Anesthesia Type:General  Level of Consciousness: awake, alert  and patient cooperative  Airway & Oxygen Therapy: Patient Spontanous Breathing and non-rebreather face mask  Post-op Assessment: Report given to RN and Post -op Vital signs reviewed and stable  Post vital signs: Reviewed and stable  Last Vitals:  Vitals Value Taken Time  BP    Temp    Pulse 97 12/27/2018  9:03 AM  Resp 21 12/27/2018  9:03 AM  SpO2 97 % 12/27/2018  9:03 AM  Vitals shown include unvalidated device data.  Last Pain:  Vitals:   12/27/18 0650  TempSrc: Oral  PainSc: 0-No pain      Patients Stated Pain Goal: 8 (30/07/62 2633)  Complications: No apparent anesthesia complications

## 2018-12-27 NOTE — Discharge Instructions (Signed)
Laparoscopic Cholecystectomy, Care After °This sheet gives you information about how to care for yourself after your procedure. Your health care provider may also give you more specific instructions. If you have problems or questions, contact your health care provider. °What can I expect after the procedure? °After the procedure, it is common to have: °· Pain at your incision sites. You will be given medicines to control this pain. °· Mild nausea or vomiting. °· Bloating and possible shoulder pain from the air-like gas that was used during the procedure. °Follow these instructions at home: °Incision care ° °· Follow instructions from your health care provider about how to take care of your incisions. Make sure you: °? Wash your hands with soap and water before you change your bandage (dressing). If soap and water are not available, use hand sanitizer. °? Change your dressing as told by your health care provider. °? Leave stitches (sutures), skin glue, or adhesive strips in place. These skin closures may need to be in place for 2 weeks or longer. If adhesive strip edges start to loosen and curl up, you may trim the loose edges. Do not remove adhesive strips completely unless your health care provider tells you to do that. °· Do not take baths, swim, or use a hot tub until your health care provider approves. Ask your health care provider if you can take showers. You may only be allowed to take sponge baths for bathing. °· Check your incision area every day for signs of infection. Check for: °? More redness, swelling, or pain. °? More fluid or blood. °? Warmth. °? Pus or a bad smell. °Activity °· Do not drive or use heavy machinery while taking prescription pain medicine. °· Do not lift anything that is heavier than 10 lb (4.5 kg) until your health care provider approves. °· Do not play contact sports until your health care provider approves. °· Do not drive for 24 hours if you were given a medicine to help you relax  (sedative). °· Rest as needed. Do not return to work or school until your health care provider approves. °General instructions °· Take over-the-counter and prescription medicines only as told by your health care provider. °· To prevent or treat constipation while you are taking prescription pain medicine, your health care provider may recommend that you: °? Drink enough fluid to keep your urine clear or pale yellow. °? Take over-the-counter or prescription medicines. °? Eat foods that are high in fiber, such as fresh fruits and vegetables, whole grains, and beans. °? Limit foods that are high in fat and processed sugars, such as fried and sweet foods. °Contact a health care provider if: °· You develop a rash. °· You have more redness, swelling, or pain around your incisions. °· You have more fluid or blood coming from your incisions. °· Your incisions feel warm to the touch. °· You have pus or a bad smell coming from your incisions. °· You have a fever. °· One or more of your incisions breaks open. °Get help right away if: °· You have trouble breathing. °· You have chest pain. °· You have increasing pain in your shoulders. °· You faint or feel dizzy when you stand. °· You have severe pain in your abdomen. °· You have nausea or vomiting that lasts for more than one day. °· You have leg pain. °This information is not intended to replace advice given to you by your health care provider. Make sure you discuss any questions you have   with your health care provider. Document Released: 10/25/2005 Document Revised: 05/15/2016 Document Reviewed: 04/12/2016 Elsevier Interactive Patient Education  2019 [place].     Bupivacaine Liposomal Suspension for Injection What is this medicine? BUPIVACAINE LIPOSOMAL (bue PIV a kane LIP oh som al) is an anesthetic. It causes loss of feeling in the skin or other tissues. It is used to prevent and to treat pain from some procedures. This medicine may be used for other  purposes; ask your health care provider or pharmacist if you have questions. COMMON BRAND NAME(S): EXPAREL What should I tell my health care provider before I take this medicine? They need to know if you have any of these conditions: -heart disease -kidney disease -liver disease -an unusual or allergic reaction to bupivacaine, other medicines, foods, dyes, or preservatives -pregnant or trying to get pregnant -breast-feeding How should I use this medicine? This medicine is for injection into the affected area. It is given by a health care professional in a hospital or clinic setting. Talk to your pediatrician regarding the use of this medicine in children. Special care may be needed. Overdosage: If you think you have taken too much of this medicine contact a poison control center or emergency room at once. NOTE: This medicine is only for you. Do not share this medicine with others. What if I miss a dose? This does not apply. What may interact with this medicine? This medicine may interact with the following medications: -acetaminophen -certain antibiotics like dapsone, nitrofurantoin, aminosalicylic acid, sulfasalazine -certain medicines for seizures like phenobarbital, phenytoin, valproic acid -chloroquine -cyclophosphamide -flutamide -hydroxyurea -ifosfamide -metoclopramide -nitroglycerin -other local anesthetics like lidocaine, pramoxine, tetracaine -primaquine -quinine This list may not describe all possible interactions. Give your health care provider a list of all the medicines, herbs, non-prescription drugs, or dietary supplements you use. Also tell them if you smoke, drink alcohol, or use illegal drugs. Some items may interact with your medicine. What should I watch for while using this medicine? Your condition will be monitored carefully while you are receiving this medicine. Be careful to avoid injury while the area is numb and you are not aware of pain. What side effects  may I notice from receiving this medicine? Side effects that you should report to your doctor or health care professional as soon as possible: -allergic reactions like skin rash, itching or hives, swelling of the face, lips, or tongue -breathing problems -changes in vision -dizziness -fast or slow, irregular heartbeat -joint pain, stiffness, or loss of motion -seizures Side effects that usually do not require medical attention (report to your doctor or health care professional if they continue or are bothersome): -constipation -irritation at site where injected -nausea, vomiting -tiredness This list may not describe all possible side effects. Call your doctor for medical advice about side effects. You may report side effects to FDA at 1-800-FDA-1088. Where should I keep my medicine? This drug is given in a hospital or clinic and will not be stored at home. NOTE: This sheet is a summary. It may not cover all possible information. If you have questions about this medicine, talk to your doctor, pharmacist, or health care provider.  2019 Elsevier/Gold Standard (2017-10-17 10:34:09)  General Anesthesia, Adult, Care After This sheet gives you information about how to care for yourself after your procedure. Your health care provider may also give you more specific instructions. If you have problems or questions, contact your health care provider. What can I expect after the procedure? After the procedure, the  following side effects are common:  Pain or discomfort at the IV site.  Nausea.  Vomiting.  Sore throat.  Trouble concentrating.  Feeling cold or chills.  Weak or tired.  Sleepiness and fatigue.  Soreness and body aches. These side effects can affect parts of the body that were not involved in surgery. Follow these instructions at home:  For at least 24 hours after the procedure:  Have a responsible adult stay with you. It is important to have someone help care for you  until you are awake and alert.  Rest as needed.  Do not: ? Participate in activities in which you could fall or become injured. ? Drive. ? Use heavy machinery. ? Drink alcohol. ? Take sleeping pills or medicines that cause drowsiness. ? Make important decisions or sign legal documents. ? Take care of children on your own. Eating and drinking  Follow any instructions from your health care provider about eating or drinking restrictions.  When you feel hungry, start by eating small amounts of foods that are soft and easy to digest (bland), such as toast. Gradually return to your regular diet.  Drink enough fluid to keep your urine pale yellow.  If you vomit, rehydrate by drinking water, juice, or clear broth. General instructions  If you have sleep apnea, surgery and certain medicines can increase your risk for breathing problems. Follow instructions from your health care provider about wearing your sleep device: ? Anytime you are sleeping, including during daytime naps. ? While taking prescription pain medicines, sleeping medicines, or medicines that make you drowsy.  Return to your normal activities as told by your health care provider. Ask your health care provider what activities are safe for you.  Take over-the-counter and prescription medicines only as told by your health care provider.  If you smoke, do not smoke without supervision.  Keep all follow-up visits as told by your health care provider. This is important. Contact a health care provider if:  You have nausea or vomiting that does not get better with medicine.  You cannot eat or drink without vomiting.  You have pain that does not get better with medicine.  You are unable to pass urine.  You develop a skin rash.  You have a fever.  You have redness around your IV site that gets worse. Get help right away if:  You have difficulty breathing.  You have chest pain.  You have blood in your urine or stool,  or you vomit blood. Summary  After the procedure, it is common to have a sore throat or nausea. It is also common to feel tired.  Have a responsible adult stay with you for the first 24 hours after general anesthesia. It is important to have someone help care for you until you are awake and alert.  When you feel hungry, start by eating small amounts of foods that are soft and easy to digest (bland), such as toast. Gradually return to your regular diet.  Drink enough fluid to keep your urine pale yellow.  Return to your normal activities as told by your health care provider. Ask your health care provider what activities are safe for you. This information is not intended to replace advice given to you by your health care provider. Make sure you discuss any questions you have with your health care provider. Document Released: 01/31/2001 Document Revised: 06/10/2017 Document Reviewed: 06/10/2017 Elsevier Interactive Patient Education  2019 [employer].

## 2018-12-27 NOTE — Anesthesia Preprocedure Evaluation (Addendum)
Anesthesia Evaluation  Patient identified by MRN, date of birth, ID band Patient awake    Reviewed: Allergy & Precautions, NPO status , Patient's Chart, lab work & pertinent test results  Airway Mallampati: II  TM Distance: >3 FB Neck ROM: Full    Dental no notable dental hx. (+) Teeth Intact   Pulmonary neg pulmonary ROS,  reports seasonal allergies -uses singulair daily -last rescue inhalers oner 4 months prior  Denies OSA - but husband reports snoring and probable OSA   Pulmonary exam normal breath sounds clear to auscultation       Cardiovascular Exercise Tolerance: Good hypertension, Pt. on medications negative cardio ROS Normal cardiovascular examI Rhythm:Regular Rate:Normal     Neuro/Psych Anxiety negative neurological ROS  negative psych ROS   GI/Hepatic negative GI ROS, Neg liver ROS, On gerd meds -per chart -hasnt taken in a few days -denies Sx today  Pt with low grade fever and reports diarrhea - will let dr. Arnoldo Morale decide if WTO Pt WTP-option of cancelling offered, discussed possible increased risk if this process worsens, understands and WTP   Endo/Other  Morbid obesity  Renal/GU negative Renal ROS  negative genitourinary   Musculoskeletal negative musculoskeletal ROS (+)   Abdominal   Peds negative pediatric ROS (+)  Hematology negative hematology ROS (+)   Anesthesia Other Findings   Reproductive/Obstetrics negative OB ROS                            Anesthesia Physical Anesthesia Plan  ASA: III  Anesthesia Plan: General   Post-op Pain Management:    Induction: Intravenous  PONV Risk Score and Plan:   Airway Management Planned: Oral ETT  Additional Equipment:   Intra-op Plan:   Post-operative Plan: Extubation in OR  Informed Consent: I have reviewed the patients History and Physical, chart, labs and discussed the procedure including the risks, benefits and  alternatives for the proposed anesthesia with the patient or authorized representative who has indicated his/her understanding and acceptance.     Dental advisory given  Plan Discussed with: CRNA  Anesthesia Plan Comments:         Anesthesia Quick Evaluation

## 2018-12-27 NOTE — Anesthesia Postprocedure Evaluation (Signed)
Anesthesia Post Note  Patient: Anita Hodges  Procedure(s) Performed: LAPAROSCOPIC CHOLECYSTECTOMY (N/A )  Anesthesia Type: General Level of consciousness: awake and alert and patient cooperative Pain management: satisfactory to patient Vital Signs Assessment: post-procedure vital signs reviewed and stable Respiratory status: spontaneous breathing Cardiovascular status: stable Postop Assessment: no apparent nausea or vomiting Anesthetic complications: no     Last Vitals:  Vitals:   12/27/18 0930 12/27/18 0945  BP: 129/81 126/90  Pulse: 89 79  Resp: 20 19  Temp:    SpO2: 98% 100%    Last Pain:  Vitals:   12/27/18 0935  TempSrc:   PainSc: 8                  Chailyn Racette

## 2018-12-27 NOTE — Anesthesia Procedure Notes (Signed)
Procedure Name: Intubation Date/Time: 12/27/2018 8:20 AM Performed by: Vista Deck, CRNA Pre-anesthesia Checklist: Patient identified, Patient being monitored, Timeout performed, Emergency Drugs available and Suction available Patient Re-evaluated:Patient Re-evaluated prior to induction Oxygen Delivery Method: Circle System Utilized Preoxygenation: Pre-oxygenation with 100% oxygen Induction Type: IV induction Ventilation: Mask ventilation without difficulty Laryngoscope Size: Mac and 3 Grade View: Grade I Tube type: Oral Tube size: 7.0 mm Number of attempts: 1 Airway Equipment and Method: stylet Placement Confirmation: ETT inserted through vocal cords under direct vision,  positive ETCO2 and breath sounds checked- equal and bilateral Secured at: 21 cm Tube secured with: Tape Dental Injury: Teeth and Oropharynx as per pre-operative assessment

## 2018-12-27 NOTE — Interval H&P Note (Signed)
History and Physical Interval Note:  12/27/2018 7:14 AM  Anita Hodges  has presented today for surgery, with the diagnosis of biliary colic  The various methods of treatment have been discussed with the patient and family. After consideration of risks, benefits and other options for treatment, the patient has consented to  Procedure(s) with comments: LAPAROSCOPIC CHOLECYSTECTOMY (N/A) - pt knows to arrive at 6:15 as a surgical intervention .  The patient's history has been reviewed, patient examined, no change in status, stable for surgery.  I have reviewed the patient's chart and labs.  Questions were answered to the patient's satisfaction.     Aviva Signs

## 2018-12-27 NOTE — Op Note (Signed)
Patient:  Anita Hodges  DOB:  [birth year]  MRN:  [id number]   Preop Diagnosis: Chronic cholecystitis  Postop Diagnosis: Same  Procedure: Laparoscopic cholecystectomy  Surgeon: Aviva Signs, MD  Anes: General endotracheal  Indications: Patient is a 33 year old morbidly obese white female who presents with biliary colic secondary to chronic cholecystitis.  The risks and benefits of the procedure including bleeding, infection, hepatobiliary injury, and the possibility of an open procedure were fully explained to the patient, who gave informed consent.  Procedure note: The patient was placed in supine position.  After induction of general endotracheal anesthesia, the abdomen was prepped and draped using the usual sterile technique with ChloraPrep.  Surgical site confirmation was performed.  A supraumbilical incision was made down to the fascia.  A Veress needle was introduced into the abdominal cavity and confirmation of placement was done using the saline drop test.  The abdomen was then insufflated to 17 mmHg pressure.  An 11 mm trocar was introduced into the abdominal cavity under direct visualization without difficulty.  The patient was placed in reverse Trendelenburg position and an additional 11 mm trocar was placed in the epigastric region and 5 mm trochars were placed in the right upper quadrant and right flank regions.  The liver was inspected and noted to be within normal limits.  The gallbladder was retracted in a dynamic fashion in order to provide a critical view of the triangle of [place].  The cystic duct was first identified.  Its juncture to the infundibulum was fully identified.  Endoclips were placed proximally and distally on the cystic duct, and the cystic duct was divided.  This was likewise done to the cystic artery.  The gallbladder was freed away from the gallbladder fossa using Bovie electrocautery.  The gallbladder was delivered through the epigastric trocar site using an  Endo Catch bag.  The gallbladder fossa was inspected and no abnormal bleeding or bile leakage was noted.  Surgicel was placed in the gallbladder fossa.  All fluid and air were then evacuated from the abdominal cavity prior to the removal of the trochars.  All wounds were irrigated with normal saline.  All wounds were injected with Exparel.  All incisions were closed using staples.  Betadine ointment and dry sterile dressings were applied.  All tape and needle counts were correct at the end of the procedure.  The patient was extubated in the operating room and transferred to PACU in stable condition.  Complications: None  EBL: Minimal  Specimen: Gallbladder

## 2018-12-27 NOTE — Progress Notes (Signed)
Dr. Wyline Mood in to see patient regarding pulse oximetry on nasal cannula.   Order received for incentive spirometry.

## 2018-12-27 NOTE — Progress Notes (Signed)
Patient with return demonstration of incentive spirometry for ten repetitions.  "That makes me feel a little better".

## 2018-12-28 ENCOUNTER — Encounter (HOSPITAL_COMMUNITY): Payer: Self-pay | Admitting: General Surgery

## 2019-01-01 ENCOUNTER — Encounter: Payer: Self-pay | Admitting: General Surgery

## 2019-01-02 ENCOUNTER — Telehealth: Payer: Self-pay | Admitting: Emergency Medicine

## 2019-01-02 NOTE — Telephone Encounter (Signed)
Called patient and spoke to her about irration in her right eye, after speaking to doctor jenkins he stated if it does not get any better to follow up with an eye doctor as it is unrelated to patients surgery. I notified patient of this and she stated she would.

## 2019-01-05 ENCOUNTER — Encounter: Payer: Self-pay | Admitting: General Surgery

## 2019-01-05 DIAGNOSIS — J101 Influenza due to other identified influenza virus with other respiratory manifestations: Secondary | ICD-10-CM | POA: Diagnosis not present

## 2019-01-09 ENCOUNTER — Encounter: Payer: Self-pay | Admitting: General Surgery

## 2019-01-09 ENCOUNTER — Ambulatory Visit (INDEPENDENT_AMBULATORY_CARE_PROVIDER_SITE_OTHER): Payer: Self-pay | Admitting: General Surgery

## 2019-01-09 VITALS — BP 110/86 | HR 86 | Temp 98.4°F | Resp 20 | Wt 364.0 lb

## 2019-01-09 DIAGNOSIS — Z09 Encounter for follow-up examination after completed treatment for conditions other than malignant neoplasm: Secondary | ICD-10-CM

## 2019-01-09 NOTE — Progress Notes (Signed)
Subjective:     Anita Hodges  Here for follow up.  Has no abdominal complaints. Objective:    BP 110/86 (BP Location: Left Arm, Patient Position: Sitting, Cuff Size: Large)   Pulse 86   Temp 98.4 F (36.9 C) (Temporal)   Resp 20   Wt (!) 364 lb (165.1 kg)   BMI 60.57 kg/m   General:  alert, cooperative and no distress  Abdomen soft, incisions healing well, staples removed, steristrips applied. Final pathology c/w diagnosis     Assessment:    Doing well postoperatively.    Plan:   Follow up here prn.

## 2019-01-19 DIAGNOSIS — J069 Acute upper respiratory infection, unspecified: Secondary | ICD-10-CM | POA: Diagnosis not present

## 2019-01-19 DIAGNOSIS — J019 Acute sinusitis, unspecified: Secondary | ICD-10-CM | POA: Diagnosis not present

## 2019-01-19 DIAGNOSIS — J101 Influenza due to other identified influenza virus with other respiratory manifestations: Secondary | ICD-10-CM | POA: Diagnosis not present

## 2019-01-23 DIAGNOSIS — J019 Acute sinusitis, unspecified: Secondary | ICD-10-CM | POA: Diagnosis not present

## 2019-01-23 DIAGNOSIS — J069 Acute upper respiratory infection, unspecified: Secondary | ICD-10-CM | POA: Diagnosis not present

## 2019-01-23 DIAGNOSIS — R05 Cough: Secondary | ICD-10-CM | POA: Diagnosis not present

## 2019-01-23 DIAGNOSIS — R0982 Postnasal drip: Secondary | ICD-10-CM | POA: Diagnosis not present

## 2019-02-26 DIAGNOSIS — H1045 Other chronic allergic conjunctivitis: Secondary | ICD-10-CM | POA: Diagnosis not present

## 2019-02-26 DIAGNOSIS — J301 Allergic rhinitis due to pollen: Secondary | ICD-10-CM | POA: Diagnosis not present

## 2019-02-26 DIAGNOSIS — J3081 Allergic rhinitis due to animal (cat) (dog) hair and dander: Secondary | ICD-10-CM | POA: Diagnosis not present

## 2019-02-26 DIAGNOSIS — J3089 Other allergic rhinitis: Secondary | ICD-10-CM | POA: Diagnosis not present

## 2019-03-19 ENCOUNTER — Other Ambulatory Visit: Payer: Self-pay

## 2019-03-19 ENCOUNTER — Encounter: Payer: Self-pay | Admitting: Obstetrics & Gynecology

## 2019-03-19 ENCOUNTER — Ambulatory Visit (INDEPENDENT_AMBULATORY_CARE_PROVIDER_SITE_OTHER): Payer: BLUE CROSS/BLUE SHIELD | Admitting: Obstetrics & Gynecology

## 2019-03-19 DIAGNOSIS — F32A Depression, unspecified: Secondary | ICD-10-CM

## 2019-03-19 DIAGNOSIS — F329 Major depressive disorder, single episode, unspecified: Secondary | ICD-10-CM | POA: Diagnosis not present

## 2019-03-19 DIAGNOSIS — F419 Anxiety disorder, unspecified: Secondary | ICD-10-CM

## 2019-03-19 DIAGNOSIS — J301 Allergic rhinitis due to pollen: Secondary | ICD-10-CM | POA: Diagnosis not present

## 2019-03-19 MED ORDER — ALPRAZOLAM 0.25 MG PO TABS: 0.2500 mg | ORAL_TABLET | Freq: Every day | ORAL | 0 refills | Status: DC | PRN

## 2019-03-19 NOTE — Progress Notes (Signed)
    Anita Hodges 1985-11-22 [id number]        33 y.o.  G0 Single  Telephone visit.  Informed consent obtained for telephone visit.  Telephone visit conducted between patient and her home and myself at my [place] office.  Counseling by phone and review of chart for 25 minutes.  RP: Worsening anxiety symptoms on Zoloft  HPI: Patient has been on Zoloft mainly for anxiety.  Denies any major depressive symptoms, no suicidal ideation.  Anxiety is now preventing her from being functional on a daily basis.  Her anxiety is increased by the COVID-19 pandemic and by the fact that she is not able to lose weight, in fact she is gaining weight with a body mass index which was already at 56.82 at the last visit in September 2019.   OB History  Gravida Para Term Preterm AB Living  0 0 0 0 0 0  SAB TAB Ectopic Multiple Live Births  0 0 0 0      Past medical history,surgical history, problem list, medications, allergies, family history and social history were all reviewed and documented in the EPIC chart.   Directed ROS with pertinent positives and negatives documented in the history of present illness/assessment and plan.  Exam:  There were no vitals filed for this visit.  Telephone visit   Assessment/Plan:  33 y.o. G0  1. Anxiety Worsening anxiety preventing patient from being functional on a daily basis.  Patient's increased anxiety is probably due to the COVID-19 pandemic and also to patient's worsening obesity.  Counseling on the benefits of physical activity on anxiety with the release of endorphins.  Patient encouraged to walk every day.  Xanax 0.25 mg tablet 1 tablet as needed daily prescribed.  Usage risks and benefits reviewed with patient.  Highly recommend psychotherapy both for anxiety and for the emotional aspect of her weight loss struggles, will suggest psychotherapists to organize treatment with.  2. Depression, unspecified depression type No major depression.  No suicidal  ideation.  Patient will continue on Zoloft 100 mg daily.  3. Morbid obesity ([place]) Body mass index at 56.82 in September 2019.  Recommend a low calorie/carb diet such as [employer] and increase aerobic activities to 5 times a week in the form of walking with weightlifting every 2 days.  Other orders - ALPRAZolam (XANAX) 0.25 MG tablet; Take 1 tablet (0.25 mg total) by mouth daily as needed for anxiety.  Princess Bruins MD, 3:06 PM 03/19/2019

## 2019-03-20 DIAGNOSIS — J3081 Allergic rhinitis due to animal (cat) (dog) hair and dander: Secondary | ICD-10-CM | POA: Diagnosis not present

## 2019-03-20 DIAGNOSIS — J3089 Other allergic rhinitis: Secondary | ICD-10-CM | POA: Diagnosis not present

## 2019-03-21 ENCOUNTER — Telehealth: Payer: BLUE CROSS/BLUE SHIELD | Admitting: Nurse Practitioner

## 2019-03-21 ENCOUNTER — Encounter: Payer: Self-pay | Admitting: Obstetrics & Gynecology

## 2019-03-21 ENCOUNTER — Encounter: Payer: Self-pay | Admitting: *Deleted

## 2019-03-21 DIAGNOSIS — Z20822 Contact with and (suspected) exposure to covid-19: Secondary | ICD-10-CM

## 2019-03-21 MED ORDER — BENZONATATE 100 MG PO CAPS: 100.0000 mg | ORAL_CAPSULE | Freq: Three times a day (TID) | ORAL | 0 refills | Status: DC | PRN

## 2019-03-21 MED ORDER — ALBUTEROL SULFATE HFA 108 (90 BASE) MCG/ACT IN AERS: 2.0000 | INHALATION_SPRAY | Freq: Four times a day (QID) | RESPIRATORY_TRACT | 0 refills | Status: DC | PRN

## 2019-03-21 NOTE — Progress Notes (Signed)
E-Visit for Corona Virus Screening  Based on your current symptoms, you may very well have the virus, however your symptoms are mild. Currently, not all patients are being tested. If the symptoms are mild and there is not a known exposure, performing the test is not indicated.  You have been enrolled in [facility] for COVID-19. Daily you will receive a questionnaire within the [facility] website. Our COVID-19 response team will be monitoring your responses daily.   Coronavirus disease 2019 (COVID-19) is a respiratory illness that can spread from person to person. The virus that causes COVID-19 is a new virus that was first identified in the country of [place] but is now found in multiple other countries and has spread to the [place].  Symptoms associated with the virus are mild to severe fever, cough, and shortness of breath. There is currently no vaccine to protect against COVID-19, and there is no specific antiviral treatment for the virus.   To be considered HIGH RISK for Coronavirus (COVID-19), you have to meet the following criteria:  . Traveled to [place], [place], [place], [place] or [place]; or in the [place] to [place], [place], [place], or [place]; and have fever, cough, and shortness of breath within the last 2 weeks of travel OR  . Been in close contact with a person diagnosed with COVID-19 within the last 2 weeks and have fever, cough, and shortness of breath  . IF YOU DO NOT MEET THESE CRITERIA, YOU ARE CONSIDERED LOW RISK FOR COVID-19.   It is vitally important that if you feel that you have an infection such as this virus or any other virus that you stay home and away from places where you may spread it to others.  You should self-quarantine for 14 days if you have symptoms that could potentially be coronavirus and avoid contact with people age 51 and older.   You can use medication such as A prescription inhaler called Albuterol MDI 90 mcg /actuation 2 puffs  every 4 hours as needed for shortness of breath, wheezing, cough and tessalon perles for cough.  You may also take acetaminophen (Tylenol) as needed for fever.   Reduce your risk of any infection by using the same precautions used for avoiding the common cold or flu:  Marland Kitchen Wash your hands often with soap and warm water for at least 20 seconds.  If soap and water are not readily available, use an alcohol-based hand sanitizer with at least 60% alcohol.  . If coughing or sneezing, cover your mouth and nose by coughing or sneezing into the elbow areas of your shirt or coat, into a tissue or into your sleeve (not your hands). . Avoid shaking hands with others and consider head nods or verbal greetings only. . Avoid touching your eyes, nose, or mouth with unwashed hands.  . Avoid close contact with people who are sick. . Avoid places or events with large numbers of people in one location, like concerts or sporting events. . Carefully consider travel plans you have or are making. . If you are planning any travel outside or inside the [place], visit the CDC's Travelers' Health webpage for the latest health notices. . If you have some symptoms but not all symptoms, continue to monitor at home and seek medical attention if your symptoms worsen. . If you are having a medical emergency, call 911.  HOME CARE . Only take medications as instructed by your medical team. . Drink plenty of fluids  and get plenty of rest. . A steam or ultrasonic humidifier can help if you have congestion.   GET HELP RIGHT AWAY IF: . You develop worsening fever. . You become short of breath . You cough up blood. . Your symptoms become more severe MAKE SURE YOU   Understand these instructions.  Will watch your condition.  Will get help right away if you are not doing well or get worse.  Your e-visit answers were reviewed by a board certified advanced clinical practitioner to complete your personal care plan.  Depending on the  condition, your plan could have included both over the counter or prescription medications.  If there is a problem please reply once you have received a response from your provider. Your safety is important to [place].  If you have drug allergies check your prescription carefully.    You can use MyChart to ask questions about today's visit, request a non-urgent call back, or ask for a work or school excuse for 24 hours related to this e-Visit. If it has been greater than 24 hours you will need to follow up with your provider, or enter a new e-Visit to address those concerns. You will get an e-mail in the next two days asking about your experience.  I hope that your e-visit has been valuable and will speed your recovery. Thank you for using e-visits.  5-10 minutes spent reviewing and documenting in chart.

## 2019-03-22 DIAGNOSIS — R05 Cough: Secondary | ICD-10-CM | POA: Diagnosis not present

## 2019-03-22 DIAGNOSIS — R197 Diarrhea, unspecified: Secondary | ICD-10-CM | POA: Diagnosis not present

## 2019-03-22 DIAGNOSIS — R0602 Shortness of breath: Secondary | ICD-10-CM | POA: Diagnosis not present

## 2019-03-22 DIAGNOSIS — R509 Fever, unspecified: Secondary | ICD-10-CM | POA: Diagnosis not present

## 2019-03-23 DIAGNOSIS — R509 Fever, unspecified: Secondary | ICD-10-CM | POA: Diagnosis not present

## 2019-03-23 DIAGNOSIS — J069 Acute upper respiratory infection, unspecified: Secondary | ICD-10-CM | POA: Diagnosis not present

## 2019-03-23 DIAGNOSIS — J029 Acute pharyngitis, unspecified: Secondary | ICD-10-CM | POA: Diagnosis not present

## 2019-03-23 DIAGNOSIS — B349 Viral infection, unspecified: Secondary | ICD-10-CM | POA: Diagnosis not present

## 2019-03-23 NOTE — Progress Notes (Signed)
I contacted patient by phone and she actually saw her PCP yesterday. She had to jump through hoops in order to be tested for corona virus. I explained the evisit process and why responses seem generic. I also expalianed how ti respond to providers in the future to get a quicker response. She understood and said sh ewould give [place] another try in the fture.

## 2019-04-03 ENCOUNTER — Encounter (INDEPENDENT_AMBULATORY_CARE_PROVIDER_SITE_OTHER): Payer: Self-pay

## 2019-04-03 ENCOUNTER — Telehealth: Payer: Self-pay | Admitting: *Deleted

## 2019-04-03 NOTE — Telephone Encounter (Signed)
Attempted to contact pt regarding my chart response of worsening shortness of breath; left message on voicemail to call back

## 2019-04-03 NOTE — Telephone Encounter (Signed)
Third attempt to contact pt regarding her my chart companion responses; left message on voicemail;

## 2019-04-03 NOTE — Telephone Encounter (Signed)
Attempted to contact pt; unable to leave message on voicemail; pre-recorded message states that the voicemail is full; will route to provider for notification.

## 2019-04-04 ENCOUNTER — Telehealth: Payer: BLUE CROSS/BLUE SHIELD | Admitting: Family

## 2019-04-04 DIAGNOSIS — R519 Headache, unspecified: Secondary | ICD-10-CM

## 2019-04-04 DIAGNOSIS — R112 Nausea with vomiting, unspecified: Secondary | ICD-10-CM | POA: Diagnosis not present

## 2019-04-04 DIAGNOSIS — R197 Diarrhea, unspecified: Secondary | ICD-10-CM | POA: Diagnosis not present

## 2019-04-04 NOTE — Progress Notes (Signed)
Based on what you shared with me, I feel your condition warrants further evaluation and I recommend that you be seen for a face to face office visit.  Given you are having a headache, dizziness, and having nausea and vomiting you need to be seen face to face to be evaluated.    NOTE: If you entered your credit card information for this eVisit, you will not be charged. You may see a "hold" on your card for the $35 but that hold will drop off and you will not have a charge processed.  If you are having a true medical emergency please call 911.  If you need an urgent face to face visit, [facility] has four urgent care centers for your convenience.    PLEASE NOTE: THE [facility] LOCATIONS AND URGENT CARE CLINICS DO NOT HAVE THE TESTING FOR CORONAVIRUS COVID19 AVAILABLE.  IF YOU FEEL YOU NEED THIS TEST YOU MUST GO TO A TRIAGE LOCATION AT [facility]   [email or web address] to reserve your spot online an avoid wait times  [facility] [address], [address] [place], [place] [address] Modified hours of operation: Monday-Friday, 12 PM to 6 PM  Saturday & Sunday 10 AM to 4 PM *Across the street from [facility] (New Address!) [address], [place], [place] [address] *Just off [address], across the road from [place] hours of operation: Monday-Friday, 12 PM to 6 PM  Closed Saturday & Sunday  [facility]'s modified hours of operation will be in effect from May 1 until May 31   The following sites will take your insurance:  . [facility] [place] a Provider at this Location  [address] [place], [place] [address] . 10 am to 8 pm Monday-Friday . 12 pm to 8 pm Saturday-Sunday   . [facility] at [place] a Provider at this Location  [address] [place], [place], [place] [address] . 8 am to 8 pm Monday-Friday . 9 am to 6 pm Saturday . 11 am to 6 pm Sunday   . [facility] at [place] Get Driving Directions  [address] [place], [place] [address] . 8 am to 8 pm Monday-Friday . 8 am to 4 pm Saturday-Sunday   Your e-visit answers were reviewed by a board certified advanced clinical practitioner to complete your personal care plan.  Thank you for using e-Visits.

## 2019-04-04 NOTE — Progress Notes (Signed)
Please seen previous Evisit  documentation.

## 2019-04-17 ENCOUNTER — Telehealth: Payer: Self-pay | Admitting: Gastroenterology

## 2019-04-17 NOTE — Telephone Encounter (Signed)
Stacey: please arrange office visit with me, may use urgent. Reason: N/V.

## 2019-04-18 NOTE — Telephone Encounter (Signed)
PATIENT SCHEDULED FOR 06/16

## 2019-04-24 ENCOUNTER — Encounter: Payer: Self-pay | Admitting: Gastroenterology

## 2019-04-24 ENCOUNTER — Ambulatory Visit (INDEPENDENT_AMBULATORY_CARE_PROVIDER_SITE_OTHER): Payer: BC Managed Care – PPO | Admitting: Gastroenterology

## 2019-04-24 ENCOUNTER — Other Ambulatory Visit: Payer: Self-pay

## 2019-04-24 VITALS — BP 185/93 | HR 100 | Temp 98.1°F | Ht 65.0 in | Wt 394.2 lb

## 2019-04-24 DIAGNOSIS — R197 Diarrhea, unspecified: Secondary | ICD-10-CM | POA: Insufficient documentation

## 2019-04-24 DIAGNOSIS — R1010 Upper abdominal pain, unspecified: Secondary | ICD-10-CM

## 2019-04-24 LAB — CBC WITH DIFFERENTIAL/PLATELET
Absolute Monocytes: 498 cells/uL (ref 200–950)
Basophils Absolute: 36 cells/uL (ref 0–200)
Basophils Relative: 0.4 %
Eosinophils Absolute: 71 cells/uL (ref 15–500)
Eosinophils Relative: 0.8 %
HCT: 38.9 % (ref 35.0–45.0)
Hemoglobin: 12.9 g/dL (ref 11.7–15.5)
Lymphs Abs: 2456 cells/uL (ref 850–3900)
MCH: 27.1 pg (ref 27.0–33.0)
MCHC: 33.2 g/dL (ref 32.0–36.0)
MCV: 81.7 fL (ref 80.0–100.0)
MPV: 9.7 fL (ref 7.5–12.5)
Monocytes Relative: 5.6 %
Neutro Abs: 5838 cells/uL (ref 1500–7800)
Neutrophils Relative %: 65.6 %
Platelets: 356 10*3/uL (ref 140–400)
RBC: 4.76 10*6/uL (ref 3.80–5.10)
RDW: 14.1 % (ref 11.0–15.0)
Total Lymphocyte: 27.6 %
WBC: 8.9 10*3/uL (ref 3.8–10.8)

## 2019-04-24 LAB — COMPLETE METABOLIC PANEL WITH GFR
AG Ratio: 1.6 (calc) (ref 1.0–2.5)
ALT: 23 U/L (ref 6–29)
AST: 21 U/L (ref 10–30)
Albumin: 3.9 g/dL (ref 3.6–5.1)
Alkaline phosphatase (APISO): 89 U/L (ref 31–125)
BUN: 9 mg/dL (ref 7–25)
CO2: 31 mmol/L (ref 20–32)
Calcium: 9.3 mg/dL (ref 8.6–10.2)
Chloride: 103 mmol/L (ref 98–110)
Creat: 0.66 mg/dL (ref 0.50–1.10)
GFR, Est African American: 135 mL/min/{1.73_m2} (ref >=60)
GFR, Est Non African American: 116 mL/min/{1.73_m2} (ref >=60)
Globulin: 2.5 g/dL (calc) (ref 1.9–3.7)
Glucose, Bld: 86 mg/dL (ref 65–99)
Potassium: 4.3 mmol/L (ref 3.5–5.3)
Sodium: 139 mmol/L (ref 135–146)
Total Bilirubin: 0.4 mg/dL (ref 0.2–1.2)
Total Protein: 6.4 g/dL (ref 6.1–8.1)

## 2019-04-24 LAB — LIPASE: Lipase: 27 U/L (ref 7–60)

## 2019-04-24 MED ORDER — PANTOPRAZOLE SODIUM 40 MG PO TBEC: 40.0000 mg | DELAYED_RELEASE_TABLET | Freq: Two times a day (BID) | ORAL | 3 refills | Status: DC

## 2019-04-24 MED ORDER — ONDANSETRON HCL 4 MG PO TABS: 4.0000 mg | ORAL_TABLET | Freq: Three times a day (TID) | ORAL | 3 refills | Status: DC | PRN

## 2019-04-24 MED ORDER — DICYCLOMINE HCL 10 MG PO CAPS: 10.0000 mg | ORAL_CAPSULE | Freq: Three times a day (TID) | ORAL | 3 refills | Status: DC

## 2019-04-24 NOTE — Assessment & Plan Note (Signed)
Likely related to post-cholecystectomy state but now worsening. Check stool studies to exclude infectious process. NO alarm signs/symptoms. May need to start Bentyl but will review stool study results first.

## 2019-04-24 NOTE — Progress Notes (Signed)
Primary Care Physician:  Patient, No Pcp Per  Primary GI: Dr. Gala Romney   Chief Complaint  Patient presents with  . Nausea    daily. vomiting 2-3 times a week  . Abdominal Pain    RUQ pain-daily, LUQ pain that comes and goes  . Diarrhea    comes/goes    HPI:   Anita Hodges is a 33 y.o. female presenting today with a history of RUQ pain, HIDA scan with normal EF but reproducible symptoms, CT unrevealing, undergoing a cholecystectomy by Dr. Arnoldo Morale in Feb 2020. EGD was also completed prior to cholecystectomy showing mild erosive reflux esophagitis, normal stomach, normal duodenum.   Sometimes pain after eating. Has pain regardless of eating. Pain located RUQ, epigastric, LUQ. Intermittent nausea and vomiting. Currently feels nauseated. Better after eating at times but sometimes still feels sick. No NSAIDs or aspirin powders. Phenergan will her feel better as she goes to sleep. Has missed a lot of work due to feeling sick. Will wake up at night if more pressure on stomach. Not eating prior to bed, waiting several hours. +caffeinated and carbonated beverages. Vomiting a few times a week. Usually at night prior to going to bed or early morning. Notes early satiety. Tries to eat a pack of crackers and only has 1 or 2.   Diarrhea: present prior to surgery but now worsening. Feels more frequent now. Will occasionally have leakage in seat at work. BM every day. 3-4 times per day. Unsure if pain relieved after BM. Protonix once per day. Prior to procedure, diarrhea would be "hit or miss". Coke has helped settle her stomach. Weight gain of 38 lbs since last seen Jan 2020.   Symptoms have been worsening since Feb 2020. Symptoms come and goes, every few days. Saw Urgent care in May, had an increase in N/V/D prior to that. No sick contacts.   Past Medical History:  Diagnosis Date  . Allergy   . Anxiety   . Hypertension   . Obesity     Past Surgical History:  Procedure Laterality Date  .  CHOLECYSTECTOMY N/A 12/27/2018   Procedure: LAPAROSCOPIC CHOLECYSTECTOMY;  Surgeon: Aviva Signs, MD;  Location: [facility];  Service: General;  Laterality: N/A;  pt knows to arrive at 6:15  . ESOPHAGOGASTRODUODENOSCOPY (EGD) WITH PROPOFOL N/A 12/18/2018   mild erosive reflux esophagitis, normal stomach, normal duodenum.   . TONSILLECTOMY    . WISDOM TOOTH EXTRACTION      Current Outpatient Medications  Medication Sig Dispense Refill  . albuterol (VENTOLIN HFA) 108 (90 Base) MCG/ACT inhaler Inhale 2 puffs into the lungs every 6 (six) hours as needed for wheezing or shortness of breath. 1 Inhaler 0  . ALPRAZolam (XANAX) 0.25 MG tablet Take 1 tablet (0.25 mg total) by mouth daily as needed for anxiety. 30 tablet 0  . budesonide-formoterol (SYMBICORT) 160-4.5 MCG/ACT inhaler Inhale 2 puffs into the lungs 2 (two) times daily.    Marland Kitchen levocetirizine (XYZAL) 5 MG tablet Take 5 mg by mouth every morning.     . montelukast (SINGULAIR) 10 MG tablet Take 10 mg by mouth at bedtime.    . promethazine (PHENERGAN) 25 MG tablet Take 0.5 tablets (12.5 mg total) by mouth every 6 (six) hours as needed for nausea or vomiting. 30 tablet 0  . sertraline (ZOLOFT) 100 MG tablet Take 1 tablet (100 mg total) by mouth daily. 30 tablet 11  . dicyclomine (BENTYL) 10 MG capsule Take 1 capsule (10 mg total) by mouth  4 (four) times daily -  before meals and at bedtime. 120 capsule 3  . ondansetron (ZOFRAN) 4 MG tablet Take 1 tablet (4 mg total) by mouth every 8 (eight) hours as needed for nausea or vomiting. 60 tablet 3  . pantoprazole (PROTONIX) 40 MG tablet Take 1 tablet (40 mg total) by mouth 2 (two) times daily before a meal. 180 tablet 3   No current facility-administered medications for this visit.     Allergies as of 04/24/2019 - Review Complete 04/24/2019  Allergen Reaction Noted  . Codeine Other (See Comments) 04/04/2014    Family History  Problem Relation Age of Onset  . Hypertension Mother   . Colon polyps  Mother        unknown age of onset  . Gallbladder disease Mother   . Diabetes Father   . Gallbladder disease Father   . Cancer Maternal Grandfather        ?  Marland Kitchen Cancer Paternal Grandmother        ?  . Colon cancer Neg Hx     Social History   Socioeconomic History  . Marital status: Single    Spouse name: Not on file  . Number of children: Not on file  . Years of education: Not on file  . Highest education level: Not on file  Occupational History  . Not on file  Social Needs  . Financial resource strain: Not on file  . Food insecurity    Worry: Not on file    Inability: Not on file  . Transportation needs    Medical: Not on file    Non-medical: Not on file  Tobacco Use  . Smoking status: Never Smoker  . Smokeless tobacco: Never Used  Substance and Sexual Activity  . Alcohol use: No    Alcohol/week: 0.0 standard drinks  . Drug use: No  . Sexual activity: Yes    Partners: Male    Birth control/protection: I.U.D.    Comment: 1st intercourse- 45, partners- 15, current partner- 4 months   Lifestyle  . Physical activity    Days per week: Not on file    Minutes per session: Not on file  . Stress: Not on file  Relationships  . Social Herbalist on phone: Not on file    Gets together: Not on file    Attends religious service: Not on file    Active member of club or organization: Not on file    Attends meetings of clubs or organizations: Not on file    Relationship status: Not on file  Other Topics Concern  . Not on file  Social History Narrative   No children.       Review of Systems: As mentioned in HPI   Physical Exam: BP (!) 185/93   Pulse 100   Temp 98.1 F (36.7 C) (Oral)   Ht 5\' 5"  (1.651 m)   Wt (!) 394 lb 3.2 oz (178.8 kg)   BMI 65.60 kg/m  General:   Alert and oriented. No distress noted. Pleasant and cooperative.  Head:  Normocephalic and atraumatic. Eyes:  Conjuctiva clear without scleral icterus. Mouth:  Oral mucosa pink and moist.   Abdomen:  +BS, soft, mild TTP epigastric and RUQ and non-distended. Large AP diameter. + Carnett's sign Msk:  Symmetrical without gross deformities. Normal posture. Extremities:  Without edema. Neurologic:  Alert and  oriented x4 Psych:  Alert and cooperative. Normal mood and affect.

## 2019-04-24 NOTE — Patient Instructions (Addendum)
Complete blood work today. I have also given you orders to complete stool studies so we can rule out any infectious process.   Please increase Protonix to twice a day, 30 minutes before breakfast and dinner. You may take Zofran before meals and at bedtime for nausea. I have also sent in Bentyl, which helps with cramping and loose stool. Take as needed before meals and at bedtime up to 4 times a day. Monitor for dry mouth, constipation.   Further recommendations to follow!  I enjoyed seeing you again today! As you know, I value our relationship and want to provide genuine, compassionate, and quality care. I welcome your feedback. If you receive a survey regarding your visit,  I greatly appreciate you taking time to fill this out. See you next time!  Annitta Needs, PhD, ANP-BC [facility]   Food Choices for Gastroesophageal Reflux Disease, Adult When you have gastroesophageal reflux disease (GERD), the foods you eat and your eating habits are very important. Choosing the right foods can help ease the discomfort of GERD. Consider working with a diet and nutrition specialist (dietitian) to help you make healthy food choices. What general guidelines should I follow?  Eating plan  Choose healthy foods low in fat, such as fruits, vegetables, whole grains, low-fat dairy products, and lean meat, fish, and poultry.  Eat frequent, small meals instead of three large meals each day. Eat your meals slowly, in a relaxed setting. Avoid bending over or lying down until 2-3 hours after eating.  Limit high-fat foods such as fatty meats or fried foods.  Limit your intake of oils, butter, and shortening to less than 8 teaspoons each day.  Avoid the following: ? Foods that cause symptoms. These may be different for different people. Keep a food diary to keep track of foods that cause symptoms. ? Alcohol. ? Drinking large amounts of liquid with meals. ? Eating meals during the 2-3 hours before  bed.  Cook foods using methods other than frying. This may include baking, grilling, or broiling. Lifestyle  Maintain a healthy weight. Ask your health care provider what weight is healthy for you. If you need to lose weight, work with your health care provider to do so safely.  Exercise for at least 30 minutes on 5 or more days each week, or as told by your health care provider.  Avoid wearing clothes that fit tightly around your waist and chest.  Do not use any products that contain nicotine or tobacco, such as cigarettes and e-cigarettes. If you need help quitting, ask your health care provider.  Sleep with the head of your bed raised. Use a wedge under the mattress or blocks under the bed frame to raise the head of the bed. What foods are not recommended? The items listed may not be a complete list. Talk with your dietitian about what dietary choices are best for you. Grains Pastries or quick breads with added fat. [place] toast. Vegetables Deep fried vegetables. [place] fries. Any vegetables prepared with added fat. Any vegetables that cause symptoms. For some people this may include tomatoes and tomato products, chili peppers, onions and garlic, and horseradish. Fruits Any fruits prepared with added fat. Any fruits that cause symptoms. For some people this may include citrus fruits, such as oranges, grapefruit, pineapple, and lemons. Meats and other protein foods High-fat meats, such as fatty beef or pork, hot dogs, ribs, ham, sausage, salami and bacon. Fried meat or protein, including fried fish and fried chicken. Nuts  and nut butters. Dairy Whole milk and chocolate milk. Sour cream. Cream. Ice cream. Cream cheese. Milk shakes. Beverages Coffee and tea, with or without caffeine. Carbonated beverages. Sodas. Energy drinks. Fruit juice made with acidic fruits (such as orange or grapefruit). Tomato juice. Alcoholic drinks. Fats and oils Butter. Margarine. Shortening. Ghee. Sweets and  desserts Chocolate and cocoa. Donuts. Seasoning and other foods Pepper. Peppermint and spearmint. Any condiments, herbs, or seasonings that cause symptoms. For some people, this may include curry, hot sauce, or vinegar-based salad dressings. Summary  When you have gastroesophageal reflux disease (GERD), food and lifestyle choices are very important to help ease the discomfort of GERD.  Eat frequent, small meals instead of three large meals each day. Eat your meals slowly, in a relaxed setting. Avoid bending over or lying down until 2-3 hours after eating.  Limit high-fat foods such as fatty meat or fried foods. This information is not intended to replace advice given to you by your health care provider. Make sure you discuss any questions you have with your health care provider. Document Released: 10/25/2005 Document Revised: 10/26/2016 Document Reviewed: 10/26/2016 Elsevier Interactive Patient Education  2019 [employer].

## 2019-04-24 NOTE — Assessment & Plan Note (Signed)
33 year old with abdominal pain earlier this year felt to be biliary related, s/p cholecystectomy Feb 2020. Underwent EGD as well prior to this with mild erosive reflux esophagitis. CT on file unrevealing. Now with RUQ/epigastric pain worsening over past few months, nausea, and intermittent vomiting. Query uncontrolled GERD. Hold off on imaging currently. No alarm features: she has gained close to 40 lbs since seen in Jan 2020. Check CBC, CMP, lipase today. Increase Protonix to BID. Add Zofran as needed for nausea. Depending on blood work, consider appropriate imaging. Doubt delayed gastric emptying but unable to rule out and less likely. Further recommendations to follow pending blood work.

## 2019-04-25 DIAGNOSIS — R197 Diarrhea, unspecified: Secondary | ICD-10-CM | POA: Diagnosis not present

## 2019-04-27 NOTE — Progress Notes (Signed)
Anita Hodges: stool studies are negative thus far. I am sending in Bentyl to take before meals and at bedtime, up to four times per day. It can cause dry mouth, constipation, drowsiness. If constipation, back off on dosing. Let's see you back in about 6-8 weeks. Please call with progress report in 1-2 weeks! Sent in Anita Hodges.   Anita Hodges: please arrange follow-up in 6-8 weeks, thanks!

## 2019-04-27 NOTE — Progress Notes (Signed)
Noted  

## 2019-04-29 LAB — STOOL CULTURE
MICRO NUMBER:: 580445
MICRO NUMBER:: 580446
MICRO NUMBER:: 580447
SHIGA RESULT:: NOT DETECTED
SPECIMEN QUALITY:: ADEQUATE
SPECIMEN QUALITY:: ADEQUATE
SPECIMEN QUALITY:: ADEQUATE

## 2019-04-29 LAB — C. DIFFICILE GDH AND TOXIN A/B
GDH ANTIGEN: NOT DETECTED
MICRO NUMBER:: 579436
SPECIMEN QUALITY:: ADEQUATE
TOXIN A AND B: NOT DETECTED

## 2019-04-29 LAB — GIARDIA ANTIGEN
MICRO NUMBER:: 579468
RESULT:: NOT DETECTED
SPECIMEN QUALITY:: ADEQUATE

## 2019-04-30 ENCOUNTER — Encounter: Payer: Self-pay | Admitting: Gastroenterology

## 2019-04-30 NOTE — Progress Notes (Signed)
PATIENT SCHEDULED AND LETTER SENT  °

## 2019-05-03 ENCOUNTER — Telehealth: Payer: Self-pay | Admitting: Gastroenterology

## 2019-05-03 DIAGNOSIS — R1011 Right upper quadrant pain: Secondary | ICD-10-CM

## 2019-05-03 DIAGNOSIS — R109 Unspecified abdominal pain: Secondary | ICD-10-CM

## 2019-05-03 NOTE — Telephone Encounter (Signed)
[facility] clinical pool:  Please arrange CT abdomen/pelvis due to RUQ pain, worsening s/p cholecystectomy but normal labs. She would like to do July 3rd if possible as she is off work.

## 2019-05-03 NOTE — Telephone Encounter (Signed)
Called pt, VM full, sent patient mychart message. 1st available at [facility] is 7/13 or patient could go to [facility].

## 2019-05-03 NOTE — Addendum Note (Signed)
Addended by: Inge Rise on: 05/03/2019 11:50 AM   Modules accepted: Orders

## 2019-05-03 NOTE — Telephone Encounter (Signed)
Patient prefers [facility]. [place] auth# [id number] dates 6/25-12/21.  [facility] is closed for 7/3. Scheduled for 7/7 at 1:30pm, npo 4 hrs prior, pick up oral prep, must wear a mask. [facility] message sent to patient with appointment details and phone # to r/s if this does not work for her.

## 2019-05-07 ENCOUNTER — Telehealth: Payer: Self-pay | Admitting: Gastroenterology

## 2019-05-07 DIAGNOSIS — R1011 Right upper quadrant pain: Secondary | ICD-10-CM

## 2019-05-07 NOTE — Telephone Encounter (Signed)
[facility] clinical pool: can we please move patient's CT up to this week. Persistent pain.

## 2019-05-07 NOTE — Telephone Encounter (Signed)
New order will need to be placed as stat. [facility] will call patient to schedule. Called [facility] and spoke with Manus Gunning. She is aware of order and they will call patient.

## 2019-05-07 NOTE — Addendum Note (Signed)
Addended by: Inge Rise on: 05/07/2019 02:56 PM   Modules accepted: Orders

## 2019-05-15 ENCOUNTER — Other Ambulatory Visit: Payer: Self-pay

## 2019-05-15 ENCOUNTER — Ambulatory Visit
Admission: RE | Admit: 2019-05-15 | Discharge: 2019-05-15 | Disposition: A | Discharge status: Home / Self Care | Payer: BC Managed Care – PPO | Source: Ambulatory Visit | Attending: Gastroenterology | Admitting: Gastroenterology

## 2019-05-15 DIAGNOSIS — Z9049 Acquired absence of other specified parts of digestive tract: Secondary | ICD-10-CM | POA: Diagnosis not present

## 2019-05-15 DIAGNOSIS — R109 Unspecified abdominal pain: Secondary | ICD-10-CM

## 2019-05-15 DIAGNOSIS — R1011 Right upper quadrant pain: Secondary | ICD-10-CM | POA: Diagnosis not present

## 2019-05-15 DIAGNOSIS — R1110 Vomiting, unspecified: Secondary | ICD-10-CM | POA: Diagnosis not present

## 2019-05-15 DIAGNOSIS — R110 Nausea: Secondary | ICD-10-CM | POA: Diagnosis not present

## 2019-05-15 MED ORDER — IOPAMIDOL (ISOVUE-300) INJECTION 61%
125.0000 mL | Freq: Once | INTRAVENOUS | Status: AC | PRN
  Administered 2019-05-15: 125 mL via INTRAVENOUS

## 2019-05-16 NOTE — Progress Notes (Signed)
[name]: your CT is normal. How are you feeling? Sent in Anita Hodges.

## 2019-05-16 NOTE — Progress Notes (Signed)
Discussed CT with Dr. Thornton Papas. No CBD dilatation. Only few millimeters in diameter. No fluid collection.

## 2019-05-23 ENCOUNTER — Telehealth: Payer: Self-pay | Admitting: Gastroenterology

## 2019-05-23 DIAGNOSIS — R112 Nausea with vomiting, unspecified: Secondary | ICD-10-CM

## 2019-05-23 DIAGNOSIS — R1011 Right upper quadrant pain: Secondary | ICD-10-CM

## 2019-05-23 MED ORDER — HYOSCYAMINE SULFATE 0.125 MG SL SUBL: 0.1250 mg | SUBLINGUAL_TABLET | SUBLINGUAL | 5 refills | Status: DC | PRN

## 2019-05-23 MED ORDER — COLESTIPOL HCL 1 G PO TABS: 2.0000 g | ORAL_TABLET | Freq: Every day | ORAL | 3 refills | Status: DC

## 2019-05-23 NOTE — Telephone Encounter (Signed)
Alicia: please let patient know I'm doing the following:  1. Colestid sent to pharmacy due to worsening diarrhea s/p cholecystectomy.  2. Stop Bentyl. Will trial Levsin only as needed for abdominal cramps. 3. Continue Zofran before meals. Phenergan prn.  4. Continue PPI twice a day,  30 minutes before breakfast and dinner. 5. May need to consider GES if persistent nausea.  6. I am reviewing with Dr. Gala Romney anything else we need to do.

## 2019-05-23 NOTE — Telephone Encounter (Signed)
Left a detailed message of Anita Hodges's recommendations. Pt can call back if she needs to discuss this medication plan.

## 2019-05-23 NOTE — Telephone Encounter (Signed)
Called pt, lmom, waiting on a return call.

## 2019-05-24 ENCOUNTER — Telehealth: Payer: Self-pay | Admitting: Gastroenterology

## 2019-05-24 ENCOUNTER — Telehealth: Payer: BC Managed Care – PPO | Admitting: Family

## 2019-05-24 DIAGNOSIS — Z20822 Contact with and (suspected) exposure to covid-19: Secondary | ICD-10-CM

## 2019-05-24 MED ORDER — BENZONATATE 100 MG PO CAPS: 100.0000 mg | ORAL_CAPSULE | Freq: Three times a day (TID) | ORAL | 0 refills | Status: DC | PRN

## 2019-05-24 MED ORDER — ALBUTEROL SULFATE HFA 108 (90 BASE) MCG/ACT IN AERS: 2.0000 | INHALATION_SPRAY | RESPIRATORY_TRACT | 2 refills | Status: DC | PRN

## 2019-05-24 NOTE — Progress Notes (Signed)
Greater than 5 minutes, yet less than 10 minutes of time have been spent researching, coordinating, and implementing care for this patient today.  Thank you for the details you included in the comment boxes. Those details are very helpful in determining the best course of treatment for you and help [place] to provide the best care.  E-Visit for Corona Virus Screening   Your current symptoms could be consistent with the coronavirus.  Many health care providers can now test patients at their office but not all are.  [facility] has multiple testing sites. For information on our COVID testing locations and hours go to [email or web address]  Please quarantine yourself while awaiting your test results.    COVID-19 is a respiratory illness with symptoms that are similar to the flu. Symptoms are typically mild to moderate, but there have been cases of severe illness and death due to the virus. The following symptoms may appear 2-14 days after exposure: . Fever . Cough . Shortness of breath or difficulty breathing . Chills . Repeated shaking with chills . Muscle pain . Headache . Sore throat . New loss of taste or smell . Fatigue . Congestion or runny nose . Nausea or vomiting . Diarrhea  It is vitally important that if you feel that you have an infection such as this virus or any other virus that you stay home and away from places where you may spread it to others.  You should self-quarantine for 14 days if you have symptoms that could potentially be coronavirus or have been in close contact a with a person diagnosed with COVID-19 within the last 2 weeks. You should avoid contact with people age 3 and older.   You should wear a mask or cloth face covering over your nose and mouth if you must be around other people or animals, including pets (even at home). Try to stay at least 6 feet away from other people. This will protect the people around you.  You can use medication  such as A prescription cough medication called Tessalon Perles 100 mg. You may take 1-2 capsules every 8 hours as needed for cough and A prescription inhaler called Albuterol MDI 90 mcg /actuation 2 puffs every 4 hours as needed for shortness of breath, wheezing, cough  You may also take acetaminophen (Tylenol) as needed for fever.   Reduce your risk of any infection by using the same precautions used for avoiding the common cold or flu:  Marland Kitchen Wash your hands often with soap and warm water for at least 20 seconds.  If soap and water are not readily available, use an alcohol-based hand sanitizer with at least 60% alcohol.  . If coughing or sneezing, cover your mouth and nose by coughing or sneezing into the elbow areas of your shirt or coat, into a tissue or into your sleeve (not your hands). . Avoid shaking hands with others and consider head nods or verbal greetings only. . Avoid touching your eyes, nose, or mouth with unwashed hands.  . Avoid close contact with people who are sick. . Avoid places or events with large numbers of people in one location, like concerts or sporting events. . Carefully consider travel plans you have or are making. . If you are planning any travel outside or inside the [place], visit the CDC's Travelers' Health webpage for the latest health notices. . If you have some symptoms but not all symptoms, continue to monitor at home and seek medical attention if your symptoms worsen. Marland Kitchen  If you are having a medical emergency, call 911.  HOME CARE . Only take medications as instructed by your medical team. . Drink plenty of fluids and get plenty of rest. . A steam or ultrasonic humidifier can help if you have congestion.   GET HELP RIGHT AWAY IF YOU HAVE EMERGENCY WARNING SIGNS** FOR COVID-19. If you or someone is showing any of these signs seek emergency medical care immediately. Call 911 or proceed to your closest emergency facility if: . You develop worsening high  fever. . Trouble breathing . Bluish lips or face . Persistent pain or pressure in the chest . New confusion . Inability to wake or stay awake . You cough up blood. . Your symptoms become more severe  **This list is not all possible symptoms. Contact your medical provider for any symptoms that are sever or concerning to you.   MAKE SURE YOU   Understand these instructions.  Will watch your condition.  Will get help right away if you are not doing well or get worse.  Your e-visit answers were reviewed by a board certified advanced clinical practitioner to complete your personal care plan.  Depending on the condition, your plan could have included both over the counter or prescription medications.  If there is a problem please reply once you have received a response from your provider.  Your safety is important to [place].  If you have drug allergies check your prescription carefully.    You can use MyChart to ask questions about today's visit, request a non-urgent call back, or ask for a work or school excuse for 24 hours related to this e-Visit. If it has been greater than 24 hours you will need to follow up with your provider, or enter a new e-Visit to address those concerns. You will get an e-mail in the next two days asking about your experience.  I hope that your e-visit has been valuable and will speed your recovery. Thank you for using e-visits.

## 2019-05-24 NOTE — Telephone Encounter (Signed)
Called [facility] for referral. Appt scheduled with Dr. Bess Kinds 06/28/19 at 8:00am. Pt will receive new pt packet in the mail. Clinical notes faxed to [facility]. Called and informed pt of appt.

## 2019-05-24 NOTE — Telephone Encounter (Signed)
Elmo Putt: please let Romey know that we are recommending referral to [facility] for tertiary evaluation. [facility] clinical pool, please refer due to persistent RUQ. N/V pain s/p cholecystectomy with negative evaluation thus far including imaging (CT), labs, failure to improve with supportive measures.

## 2019-05-24 NOTE — Addendum Note (Signed)
Addended by: Hassan Rowan on: 05/24/2019 02:48 PM   Modules accepted: Orders

## 2019-05-24 NOTE — Telephone Encounter (Signed)
Opened in error

## 2019-05-24 NOTE — Telephone Encounter (Signed)
Pt notified of AB's recommendations.

## 2019-06-08 ENCOUNTER — Telehealth: Payer: Self-pay | Admitting: Gastroenterology

## 2019-06-08 ENCOUNTER — Encounter: Payer: Self-pay | Admitting: Gastroenterology

## 2019-06-08 NOTE — Telephone Encounter (Addendum)
FMLA forms completed. I have messaged patient to let her know.

## 2019-06-13 ENCOUNTER — Telehealth: Payer: Self-pay | Admitting: Gastroenterology

## 2019-06-13 NOTE — Telephone Encounter (Signed)
Patient is aware that the FMLA forms are ready, there's a $29 fee and to be here before 3pm to pick up.

## 2019-06-13 NOTE — Telephone Encounter (Signed)
Noted  

## 2019-06-14 NOTE — Telephone Encounter (Signed)
Noted  

## 2019-06-14 NOTE — Telephone Encounter (Signed)
Patient picked up and paid for FMLA papers.  They were faxed to her employer 06/13/19.

## 2019-06-20 ENCOUNTER — Encounter: Payer: Self-pay | Admitting: Gastroenterology

## 2019-06-28 DIAGNOSIS — R197 Diarrhea, unspecified: Secondary | ICD-10-CM | POA: Diagnosis not present

## 2019-06-28 DIAGNOSIS — R1010 Upper abdominal pain, unspecified: Secondary | ICD-10-CM | POA: Diagnosis not present

## 2019-06-28 DIAGNOSIS — R194 Change in bowel habit: Secondary | ICD-10-CM | POA: Diagnosis not present

## 2019-06-28 DIAGNOSIS — R110 Nausea: Secondary | ICD-10-CM | POA: Diagnosis not present

## 2019-07-03 ENCOUNTER — Emergency Department (HOSPITAL_COMMUNITY): Payer: BC Managed Care – PPO

## 2019-07-03 ENCOUNTER — Other Ambulatory Visit: Payer: Self-pay

## 2019-07-03 ENCOUNTER — Encounter (HOSPITAL_COMMUNITY): Payer: Self-pay | Admitting: Emergency Medicine

## 2019-07-03 ENCOUNTER — Emergency Department (HOSPITAL_COMMUNITY)
Admission: EM | Admit: 2019-07-03 | Discharge: 2019-07-03 | Disposition: A | Discharge status: Home / Self Care | Payer: BC Managed Care – PPO | Attending: Emergency Medicine | Admitting: Emergency Medicine

## 2019-07-03 DIAGNOSIS — Z9049 Acquired absence of other specified parts of digestive tract: Secondary | ICD-10-CM | POA: Diagnosis not present

## 2019-07-03 DIAGNOSIS — I10 Essential (primary) hypertension: Secondary | ICD-10-CM | POA: Insufficient documentation

## 2019-07-03 DIAGNOSIS — R1010 Upper abdominal pain, unspecified: Secondary | ICD-10-CM | POA: Diagnosis not present

## 2019-07-03 DIAGNOSIS — R140 Abdominal distension (gaseous): Secondary | ICD-10-CM | POA: Diagnosis not present

## 2019-07-03 DIAGNOSIS — R1011 Right upper quadrant pain: Secondary | ICD-10-CM | POA: Insufficient documentation

## 2019-07-03 DIAGNOSIS — Z3202 Encounter for pregnancy test, result negative: Secondary | ICD-10-CM | POA: Diagnosis not present

## 2019-07-03 DIAGNOSIS — G8929 Other chronic pain: Secondary | ICD-10-CM | POA: Diagnosis not present

## 2019-07-03 DIAGNOSIS — N3000 Acute cystitis without hematuria: Secondary | ICD-10-CM | POA: Insufficient documentation

## 2019-07-03 DIAGNOSIS — Z79899 Other long term (current) drug therapy: Secondary | ICD-10-CM | POA: Insufficient documentation

## 2019-07-03 LAB — URINALYSIS, ROUTINE W REFLEX MICROSCOPIC
Bilirubin Urine: NEGATIVE
Glucose, UA: NEGATIVE mg/dL
Hgb urine dipstick: NEGATIVE
Ketones, ur: NEGATIVE mg/dL
Nitrite: NEGATIVE
Protein, ur: NEGATIVE mg/dL
Specific Gravity, Urine: 1.025 (ref 1.005–1.030)
pH: 5 (ref 5.0–8.0)

## 2019-07-03 LAB — COMPREHENSIVE METABOLIC PANEL
ALT: 37 U/L (ref 0–44)
AST: 28 U/L (ref 15–41)
Albumin: 3.8 g/dL (ref 3.5–5.0)
Alkaline Phosphatase: 97 U/L (ref 38–126)
Anion gap: 7 (ref 5–15)
BUN: 10 mg/dL (ref 6–20)
CO2: 26 mmol/L (ref 22–32)
Calcium: 8.8 mg/dL — ABNORMAL LOW (ref 8.9–10.3)
Chloride: 104 mmol/L (ref 98–111)
Creatinine, Ser: 0.7 mg/dL (ref 0.44–1.00)
GFR calc Af Amer: >60 mL/min (ref >60)
GFR calc non Af Amer: >60 mL/min (ref >60)
Glucose, Bld: 102 mg/dL — ABNORMAL HIGH (ref 70–99)
Potassium: 4 mmol/L (ref 3.5–5.1)
Sodium: 137 mmol/L (ref 135–145)
Total Bilirubin: 0.6 mg/dL (ref 0.3–1.2)
Total Protein: 7.3 g/dL (ref 6.5–8.1)

## 2019-07-03 LAB — DIFFERENTIAL
Abs Immature Granulocytes: 0.03 10*3/uL (ref 0.00–0.07)
Basophils Absolute: 0 10*3/uL (ref 0.0–0.1)
Basophils Relative: 0 %
Eosinophils Absolute: 0.1 10*3/uL (ref 0.0–0.5)
Eosinophils Relative: 1 %
Immature Granulocytes: 0 %
Lymphocytes Relative: 28 %
Lymphs Abs: 2.6 10*3/uL (ref 0.7–4.0)
Monocytes Absolute: 0.5 10*3/uL (ref 0.1–1.0)
Monocytes Relative: 5 %
Neutro Abs: 6.1 10*3/uL (ref 1.7–7.7)
Neutrophils Relative %: 66 %

## 2019-07-03 LAB — CBC
HCT: 42.4 % (ref 36.0–46.0)
Hemoglobin: 13.3 g/dL (ref 12.0–15.0)
MCH: 26.2 pg (ref 26.0–34.0)
MCHC: 31.4 g/dL (ref 30.0–36.0)
MCV: 83.6 fL (ref 80.0–100.0)
Platelets: 358 10*3/uL (ref 150–400)
RBC: 5.07 MIL/uL (ref 3.87–5.11)
RDW: 14.6 % (ref 11.5–15.5)
WBC: 9.6 10*3/uL (ref 4.0–10.5)
nRBC: 0 % (ref 0.0–0.2)

## 2019-07-03 LAB — PREGNANCY, URINE: Preg Test, Ur: NEGATIVE

## 2019-07-03 LAB — LIPASE, BLOOD: Lipase: 33 U/L (ref 11–51)

## 2019-07-03 MED ORDER — MORPHINE SULFATE (PF) 4 MG/ML IV SOLN
4.0000 mg | Freq: Once | INTRAVENOUS | Status: AC
  Administered 2019-07-03: 4 mg via INTRAVENOUS
  Filled 2019-07-03: qty 1

## 2019-07-03 MED ORDER — NITROFURANTOIN MONOHYD MACRO 100 MG PO CAPS: 100.0000 mg | ORAL_CAPSULE | Freq: Two times a day (BID) | ORAL | 0 refills | Status: DC

## 2019-07-03 MED ORDER — HYDROCODONE-ACETAMINOPHEN 5-325 MG PO TABS: 1.0000 | ORAL_TABLET | ORAL | 0 refills | Status: DC | PRN

## 2019-07-03 MED ORDER — HYDROCODONE-ACETAMINOPHEN 5-325 MG PO TABS
1.0000 | ORAL_TABLET | Freq: Once | ORAL | Status: AC
  Administered 2019-07-03: 21:00:00 1 via ORAL
  Filled 2019-07-03: qty 1

## 2019-07-03 MED ORDER — ONDANSETRON HCL 4 MG/2ML IJ SOLN
4.0000 mg | Freq: Once | INTRAMUSCULAR | Status: AC
  Administered 2019-07-03: 4 mg via INTRAVENOUS
  Filled 2019-07-03: qty 2

## 2019-07-03 NOTE — ED Provider Notes (Signed)
[facility] EMERGENCY DEPARTMENT Provider Note   CSN: [id number] Arrival date & time: 07/03/19  1609     History   Chief Complaint Chief Complaint  Patient presents with  . Abdominal Pain    HPI Anita Hodges is a 33 y.o. female with a history as outlined below, most significant for chronic right upper quadrant abdominal pain and lap cholecystectomy February 2020 which has not alleviated her symptoms.  She has persistent daily pain at this site which escalated yesterday hence prompting her visit here.  She is followed by Roseanne Kaufman with [facility] for this condition and just underwent tertiary GI eval at [facility] on August 20 at which time it was recommended she undergo testing for celiac disease and upper and lower scopes to further assess this chronic pain.  She denies vomiting but has constant nausea despite using Zofran and/or Phenergan.  She has loose yellow stools with frequent passage of mucus which has been chronic since prior to her gallbladder surgery.  She denies fevers or chills.  She does endorse sharp right upper quadrant pain which radiates around to her right flank.  This has been constant since yesterday.  She has found no alleviators.  It is not worsened with movement and is not positional.  It is also not improved or worsened with p.o. intake.     The history is provided by the patient.    Past Medical History:  Diagnosis Date  . Allergy   . Anxiety   . Hypertension   . Obesity     Patient Active Problem List   Diagnosis Date Noted  . Pain of upper abdomen 04/24/2019  . Diarrhea 04/24/2019  . Chronic cholecystitis   . Right upper quadrant abdominal pain 12/29/2017  . IUD (intrauterine device) in place 07/19/2016  . Pedal edema 06/12/2014  . Overweight(278.02) 01/29/2013    Past Surgical History:  Procedure Laterality Date  . CHOLECYSTECTOMY N/A 12/27/2018   Procedure: LAPAROSCOPIC CHOLECYSTECTOMY;  Surgeon: Aviva Signs, MD;  Location: [facility];   Service: General;  Laterality: N/A;  pt knows to arrive at 6:15  . ESOPHAGOGASTRODUODENOSCOPY (EGD) WITH PROPOFOL N/A 12/18/2018   mild erosive reflux esophagitis, normal stomach, normal duodenum.   . TONSILLECTOMY    . WISDOM TOOTH EXTRACTION       OB History    Gravida  0   Para  0   Term  0   Preterm  0   AB  0   Living  0     SAB  0   TAB  0   Ectopic  0   Multiple  0   Live Births               Home Medications    Prior to Admission medications   Medication Sig Start Date End Date Taking? Authorizing Provider  albuterol (VENTOLIN HFA) 108 (90 Base) MCG/ACT inhaler Inhale 2 puffs into the lungs every 4 (four) hours as needed for wheezing or shortness of breath. 05/24/19  Yes Withrow, Elyse Jarvis, FNP  ALPRAZolam (XANAX) 0.25 MG tablet Take 1 tablet (0.25 mg total) by mouth daily as needed for anxiety. 03/19/19  Yes Princess Bruins, MD  benzonatate (TESSALON PERLES) 100 MG capsule Take 1-2 capsules (100-200 mg total) by mouth every 8 (eight) hours as needed for cough. 05/24/19  Yes Withrow, Elyse Jarvis, FNP  budesonide-formoterol (SYMBICORT) 160-4.5 MCG/ACT inhaler Inhale 2 puffs into the lungs 2 (two) times daily.   Yes [provider]  colestipol (COLESTID) 1 g tablet Take 2 tablets (2 g total) by mouth daily. 05/23/19  Yes Annitta Needs, NP  hyoscyamine (LEVSIN SL) 0.125 MG SL tablet Place 1 tablet (0.125 mg total) under the tongue every 4 (four) hours as needed. For abdominal cramping. Do not take with Bentyl. 05/23/19  Yes Annitta Needs, NP  levocetirizine (XYZAL) 5 MG tablet Take 5 mg by mouth every morning.    Yes [provider]  montelukast (SINGULAIR) 10 MG tablet Take 10 mg by mouth at bedtime.   Yes [provider]  ondansetron (ZOFRAN) 4 MG tablet Take 1 tablet (4 mg total) by mouth every 8 (eight) hours as needed for nausea or vomiting. 04/24/19  Yes Annitta Needs, NP  pantoprazole (PROTONIX) 40 MG tablet Take 1 tablet (40 mg total) by  mouth 2 (two) times daily before a meal. 04/24/19  Yes Annitta Needs, NP  promethazine (PHENERGAN) 25 MG tablet Take 0.5 tablets (12.5 mg total) by mouth every 6 (six) hours as needed for nausea or vomiting. 12/05/18  Yes Annitta Needs, NP  sertraline (ZOLOFT) 100 MG tablet Take 1 tablet (100 mg total) by mouth daily. 08/07/18  Yes Princess Bruins, MD  dicyclomine (BENTYL) 10 MG capsule Take 1 capsule (10 mg total) by mouth 4 (four) times daily -  before meals and at bedtime. Patient not taking: Reported on 07/03/2019 04/24/19   Annitta Needs, NP  HYDROcodone-acetaminophen (NORCO/VICODIN) 5-325 MG tablet Take 1 tablet by mouth every 4 (four) hours as needed. 07/03/19   Evalee Jefferson, PA-C  nitrofurantoin, macrocrystal-monohydrate, (MACROBID) 100 MG capsule Take 1 capsule (100 mg total) by mouth 2 (two) times daily. 07/03/19   Evalee Jefferson, PA-C    Family History Family History  Problem Relation Age of Onset  . Hypertension Mother   . Colon polyps Mother        unknown age of onset  . Gallbladder disease Mother   . Diabetes Father   . Gallbladder disease Father   . Cancer Maternal Grandfather        ?  Marland Kitchen Cancer Paternal Grandmother        ?  . Colon cancer Neg Hx     Social History Social History   Tobacco Use  . Smoking status: Never Smoker  . Smokeless tobacco: Never Used  Substance Use Topics  . Alcohol use: No    Alcohol/week: 0.0 standard drinks  . Drug use: No     Allergies   Codeine   Review of Systems Review of Systems  Constitutional: Negative for chills and fever.  HENT: Negative for congestion and sore throat.   Eyes: Negative.   Respiratory: Negative for chest tightness and shortness of breath.   Cardiovascular: Negative for chest pain.  Gastrointestinal: Positive for abdominal pain, diarrhea and nausea. Negative for vomiting.  Genitourinary: Negative.   Musculoskeletal: Negative for arthralgias, joint swelling and neck pain.  Skin: Negative.  Negative for rash  and wound.  Neurological: Negative for dizziness, weakness, light-headedness, numbness and headaches.  Psychiatric/Behavioral: Negative.      Physical Exam Updated Vital Signs BP (!) 178/96 (BP Location: Right Wrist)   Pulse 98   Temp 99 F (37.2 C) (Oral)   Resp 18   Ht 5\' 5"  (1.651 m)   Wt (!) 167.8 kg   SpO2 95%   BMI 61.57 kg/m   Physical Exam Vitals signs and nursing note reviewed.  Constitutional:  Appearance: She is well-developed. She is obese.  HENT:     Head: Normocephalic and atraumatic.  Eyes:     Conjunctiva/sclera: Conjunctivae normal.  Neck:     Musculoskeletal: Normal range of motion.  Cardiovascular:     Rate and Rhythm: Normal rate and regular rhythm.     Heart sounds: Normal heart sounds.  Pulmonary:     Effort: Pulmonary effort is normal.     Breath sounds: Normal breath sounds. No wheezing.  Abdominal:     General: Bowel sounds are normal.     Palpations: Abdomen is soft.     Tenderness: There is abdominal tenderness in the right upper quadrant. There is no right CVA tenderness, left CVA tenderness or guarding. Negative signs include Murphy's sign and McBurney's sign.  Musculoskeletal: Normal range of motion.  Skin:    General: Skin is warm and dry.  Neurological:     Mental Status: She is alert.      ED Treatments / Results  Labs (all labs ordered are listed, but only abnormal results are displayed) Labs Reviewed  COMPREHENSIVE METABOLIC PANEL - Abnormal; Notable for the following components:      Result Value   Glucose, Bld 102 (*)    Calcium 8.8 (*)    All other components within normal limits  URINALYSIS, ROUTINE W REFLEX MICROSCOPIC - Abnormal; Notable for the following components:   APPearance HAZY (*)    Leukocytes,Ua MODERATE (*)    Bacteria, UA RARE (*)    All other components within normal limits  URINE CULTURE  LIPASE, BLOOD  CBC  PREGNANCY, URINE  DIFFERENTIAL    EKG None  Radiology Dg Abd Acute 2+v W 1v  Chest  Result Date: 07/03/2019 CLINICAL DATA:  Upper abdominal pain and distention. EXAM: DG ABDOMEN ACUTE W/ 1V CHEST COMPARISON:  None. FINDINGS: There is no evidence of dilated bowel loops or free intraperitoneal air. Intrauterine device is noted. Status post cholecystectomy. No radiopaque calculi or other significant radiographic abnormality is seen. Heart size and mediastinal contours are within normal limits. Both lungs are clear. IMPRESSION: No evidence of bowel obstruction or ileus. No acute cardiopulmonary disease. Electronically Signed   By: Marijo Conception M.D.   On: 07/03/2019 19:17    Procedures Procedures (including critical care time)  Medications Ordered in ED Medications  ondansetron (ZOFRAN) injection 4 mg (4 mg Intravenous Given 07/03/19 1759)  morphine 4 MG/ML injection 4 mg (4 mg Intravenous Given 07/03/19 1801)     Initial Impression / Assessment and Plan / ED Course  I have reviewed the triage vital signs and the nursing notes.  Pertinent labs & imaging results that were available during my care of the patient were reviewed by me and considered in my medical decision making (see chart for details).        Patient with chronic right upper quadrant abdominal pain with normal labs and imaging this evening.  Labs were reviewed with patient and interpreted with no acute findings.  There is no surgical findings on tonight's work-up or per her exam.  She does have leukocytes and rare bacteria in her urine.  She was prescribed Macrobid, culture is pending.  She was encouraged to follow-up with the GI specialist at [facility] to complete his recommended work-up.  She was given a small quantity of hydrocodone as she states she was unable to sleep last night secondary to pain.  [place] was reviewed prior to prescribing.  Patient was seen by  Dr. Lacinda Axon during this ED visit.  The patient appears reasonably screened and/or stabilized for discharge and I doubt any  other medical condition or other [facility] requiring further screening, evaluation, or treatment in the ED at this time prior to discharge.   Final Clinical Impressions(s) / ED Diagnoses   Final diagnoses:  Chronic abdominal pain  Acute cystitis without hematuria    ED Discharge Orders         Ordered    HYDROcodone-acetaminophen (NORCO/VICODIN) 5-325 MG tablet  Every 4 hours PRN     07/03/19 1958    nitrofurantoin, macrocrystal-monohydrate, (MACROBID) 100 MG capsule  2 times daily     07/03/19 1958           Landis Martins 07/03/19 Alden Benjamin, MD 07/04/19 [phone]

## 2019-07-03 NOTE — Discharge Instructions (Addendum)
Your labs and x-rays are stable tonight, there is no evidence for abdominal obstruction or lab abnormalities.  However you do have an increased white blood cell count in your urine and if you bacteria, suggesting a mild urinary tract infection.  You have been prescribed antibiotics for this.  You may take the hydrocodone prescribed if needed for pain relief, especially at night to help you sleep better.  Do not drive within force of taking this medication as it will make you drowsy.  Follow-up with your GI specialist at [facility] for the remaining work-up as recommended by him.

## 2019-07-03 NOTE — ED Triage Notes (Signed)
Pt c/o of upper abdominal pain with n/d that radiates to the left side of her back.

## 2019-07-04 ENCOUNTER — Ambulatory Visit: Payer: BC Managed Care – PPO | Admitting: Gastroenterology

## 2019-07-05 LAB — URINE CULTURE: Culture: <10000 — AB

## 2019-07-06 ENCOUNTER — Telehealth: Payer: Self-pay | Admitting: Gastroenterology

## 2019-07-06 ENCOUNTER — Other Ambulatory Visit: Payer: Self-pay

## 2019-07-06 DIAGNOSIS — R1011 Right upper quadrant pain: Secondary | ICD-10-CM

## 2019-07-06 DIAGNOSIS — R197 Diarrhea, unspecified: Secondary | ICD-10-CM

## 2019-07-06 MED ORDER — COLESTIPOL HCL 1 G PO TABS: 2.0000 g | ORAL_TABLET | Freq: Two times a day (BID) | ORAL | 3 refills | Status: DC

## 2019-07-06 NOTE — Telephone Encounter (Signed)
Noted. Orders released  

## 2019-07-06 NOTE — Telephone Encounter (Signed)
I have ordered celiac serologies for patient. I also sent in Colestid to trial twice a day. Patient is aware. Labs just need to be released. Thanks!

## 2019-07-09 NOTE — Telephone Encounter (Signed)
Patient is coming in to see AB tomorrow at 9:30 am

## 2019-07-10 ENCOUNTER — Encounter: Payer: Self-pay | Admitting: Gastroenterology

## 2019-07-10 ENCOUNTER — Other Ambulatory Visit: Payer: Self-pay

## 2019-07-10 ENCOUNTER — Ambulatory Visit (INDEPENDENT_AMBULATORY_CARE_PROVIDER_SITE_OTHER): Payer: BC Managed Care – PPO | Admitting: Gastroenterology

## 2019-07-10 VITALS — BP 148/92 | HR 87 | Temp 97.3°F | Ht 65.0 in | Wt 390.2 lb

## 2019-07-10 DIAGNOSIS — R109 Unspecified abdominal pain: Secondary | ICD-10-CM | POA: Diagnosis not present

## 2019-07-10 DIAGNOSIS — K625 Hemorrhage of anus and rectum: Secondary | ICD-10-CM | POA: Diagnosis not present

## 2019-07-10 DIAGNOSIS — R197 Diarrhea, unspecified: Secondary | ICD-10-CM | POA: Diagnosis not present

## 2019-07-10 MED ORDER — RIFAXIMIN 550 MG PO TABS: 550.0000 mg | ORAL_TABLET | Freq: Three times a day (TID) | ORAL | 0 refills | Status: AC

## 2019-07-10 MED ORDER — PEG 3350-KCL-NA BICARB-NACL 420 G PO SOLR: 4000.0000 mL | ORAL | 0 refills | Status: DC

## 2019-07-10 NOTE — Patient Instructions (Addendum)
I have sent in Xifaxan to take three times a day for 14 days. We may have to do prior approval for this.  Return to work will be July 17, 2019. We are faxing the most recent paperwork.  We are arranging a colonoscopy with Dr. Gala Romney as soon as we can. In the meantime, please also complete the blood work.  I highly recommend reaching out to establish care with a primary care physician. I have included a possibility below:  Dr. Roma Kayser office  [facility] [address] [place], [place] [address]  [phone]  I enjoyed seeing you again today! As you know, I value our relationship and want to provide genuine, compassionate, and quality care. I welcome your feedback. If you receive a survey regarding your visit,  I greatly appreciate you taking time to fill this out. See you next time!  Annitta Needs, PhD, ANP-BC [facility]

## 2019-07-10 NOTE — Progress Notes (Signed)
Primary Care Physician:  Patient, No Pcp Per Primary GI: Dr. Gala Romney   Chief Complaint  Patient presents with  . Abdominal Pain  . Diarrhea    HPI:   Anita Hodges is a 33 y.o. female presenting today with a history of chronic abdominal pain, HIDA scan in past with normal EF but reproducible symptoms and underwent cholecystectomy Feb 2020. CT on file months ago unrevealing, EGD also completed with mild erosive reflux esophagitis. Pain persistent s/p cholecystectomy, reported as RUQ/epigastric/LUQ, intermittent N/V, persistent diarrhea with normal stool studies, fecal urgency. Diarrhea present prior to surgery but now worsened and more frequent. Updated CT scan July 2020 was negative. Labs unrevealing. Unresponsive to supportive measures including Bentyl,Levsin, unable to tolerate Questran, not a candidate for Viberzi. She actually was referred to [facility] after discussion with Dr. Gala Romney and persistent symptoms without clear etiology. [facility] felt symptoms multifactorial with likely IBS-D, post-cholecystectomy diarrhea, GERD. Recommended celiac serologies and colonoscopy. Consideration for SBBO. She has been out of work since mid June due to affected quality of life and inability to work with significant symptoms. She is on FMLA currently. I had provided forms for her to be out from time I last saw her with significant symptoms until she was seen by [place], as at that time subjectively she was having difficulty performing her job duties. I had informed her we would offer this through the visit at [facility], as then we could see what upcoming plan would be.   It is notable that patient's weight in Jan 2020 when she first came for evaluation was 356. When I saw her again in June 2020 with worsening symptoms, her weight was 394. She is now 390, three months later, which is 4 lbs weight loss over 3 months. However, there is still at 40+ pound weight gain since her initial visit in  Jan 2020.   Taste of Questran was difficult to tolerate and had to force down. Felt like everything was cramping when she took [place]. About 7-8  BMs per day. Ranging from watery to soft. Predominantly water. With wiping, can see blood on toilet paper. Pain is predominantly RUQ but can range everywhere. Pain waxes and wanes but predominantly underlying. Never really goes away. No relief with BM. No fever or chills. Nausea underlying mostly. Hasn't vomited in a week. Able to tolerate "any" foods. Trigger foods: pizza. Colestid tablets: in past but hasn't picked up yet. Protonix BID. Phenergan seems to work the best for nausea.   No PCP. Feels down about her health, specifically GI issues currently. Endorses a good support system to include family and friends. Zoloft chronically. She notes that her job has been very stressful. 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 She does note worsening of symptoms with anxiety and stress.   Past Medical History:  Diagnosis Date  . Allergy   . Anxiety   . Hypertension   . Obesity     Past Surgical History:  Procedure Laterality Date  . CHOLECYSTECTOMY N/A 12/27/2018   Procedure: LAPAROSCOPIC CHOLECYSTECTOMY;  Surgeon: Aviva Signs, MD;  Location: [facility];  Service: General;  Laterality: N/A;  pt knows to arrive at 6:15  . ESOPHAGOGASTRODUODENOSCOPY (EGD) WITH PROPOFOL N/A 12/18/2018   mild erosive reflux esophagitis, normal stomach, normal duodenum.   . TONSILLECTOMY    . WISDOM TOOTH EXTRACTION      Current Outpatient Medications  Medication Sig Dispense Refill  .  albuterol (VENTOLIN HFA) 108 (90 Base) MCG/ACT inhaler Inhale 2 puffs into the lungs every 4 (four) hours as needed for wheezing or shortness of breath. 8 g 2  . ALPRAZolam (XANAX) 0.25 MG tablet Take 1 tablet (0.25 mg total) by mouth daily as needed for anxiety. 30 tablet 0  . benzonatate (TESSALON PERLES) 100 MG capsule Take 1-2 capsules (100-200 mg total) by  mouth every 8 (eight) hours as needed for cough. 30 capsule 0  . budesonide-formoterol (SYMBICORT) 160-4.5 MCG/ACT inhaler Inhale 2 puffs into the lungs 2 (two) times daily.    . hyoscyamine (LEVSIN SL) 0.125 MG SL tablet Place 1 tablet (0.125 mg total) under the tongue every 4 (four) hours as needed. For abdominal cramping. Do not take with Bentyl. 60 tablet 5  . levocetirizine (XYZAL) 5 MG tablet Take 5 mg by mouth every morning.     . montelukast (SINGULAIR) 10 MG tablet Take 10 mg by mouth at bedtime.    . nitrofurantoin, macrocrystal-monohydrate, (MACROBID) 100 MG capsule Take 1 capsule (100 mg total) by mouth 2 (two) times daily. 10 capsule 0  . ondansetron (ZOFRAN) 4 MG tablet Take 1 tablet (4 mg total) by mouth every 8 (eight) hours as needed for nausea or vomiting. 60 tablet 3  . pantoprazole (PROTONIX) 40 MG tablet Take 1 tablet (40 mg total) by mouth 2 (two) times daily before a meal. 180 tablet 3  . promethazine (PHENERGAN) 25 MG tablet Take 0.5 tablets (12.5 mg total) by mouth every 6 (six) hours as needed for nausea or vomiting. 30 tablet 0  . sertraline (ZOLOFT) 100 MG tablet Take 1 tablet (100 mg total) by mouth daily. 30 tablet 11  . polyethylene glycol-electrolytes (TRILYTE) 420 g solution Take 4,000 mLs by mouth as directed. 4000 mL 0  . rifaximin (XIFAXAN) 550 MG TABS tablet Take 1 tablet (550 mg total) by mouth 3 (three) times daily for 14 days. 42 tablet 0   No current facility-administered medications for this visit.     Allergies as of 07/10/2019 - Review Complete 07/10/2019  Allergen Reaction Noted  . Codeine Other (See Comments) 04/04/2014    Family History  Problem Relation Age of Onset  . Hypertension Mother   . Colon polyps Mother        unknown age of onset  . Gallbladder disease Mother   . Diabetes Father   . Gallbladder disease Father   . Cancer Maternal Grandfather        ?  Marland Kitchen Cancer Paternal Grandmother        ?  . Colon cancer Neg Hx     Social  History   Socioeconomic History  . Marital status: Single    Spouse name: Not on file  . Number of children: Not on file  . Years of education: Not on file  . Highest education level: Not on file  Occupational History  . Not on file  Social Needs  . Financial resource strain: Not on file  . Food insecurity    Worry: Not on file    Inability: Not on file  . Transportation needs    Medical: Not on file    Non-medical: Not on file  Tobacco Use  . Smoking status: Never Smoker  . Smokeless tobacco: Never Used  Substance and Sexual Activity  . Alcohol use: No    Alcohol/week: 0.0 standard drinks  . Drug use: No  . Sexual activity: Yes    Partners: Male  Birth control/protection: I.U.D.    Comment: 1st intercourse- 85, partners- 38, current partner- 4 months   Lifestyle  . Physical activity    Days per week: Not on file    Minutes per session: Not on file  . Stress: Not on file  Relationships  . Social Herbalist on phone: Not on file    Gets together: Not on file    Attends religious service: Not on file    Active member of club or organization: Not on file    Attends meetings of clubs or organizations: Not on file    Relationship status: Not on file  Other Topics Concern  . Not on file  Social History Narrative   No children.       Review of Systems: Gen: Denies fever, chills, anorexia. Denies fatigue, weakness, weight loss.  CV: Denies chest pain, palpitations, syncope, peripheral edema, and claudication. Resp: Denies dyspnea at rest, cough, wheezing, coughing up blood, and pleurisy. GI: see HPI Derm: Denies rash, itching, dry skin Psych: see HPI Heme: Denies bruising, bleeding, and enlarged lymph nodes.  Physical Exam: BP (!) 148/92   Pulse 87   Temp (!) 97.3 F (36.3 C) (Oral)   Ht 5\' 5"  (1.651 m)   Wt (!) 390 lb 3.2 oz (177 kg)   BMI 64.93 kg/m  General:   Alert and oriented. No distress noted. Pleasant and cooperative.  Head:   Normocephalic and atraumatic. Eyes:  Conjuctiva clear without scleral icterus. Lungs: clear bilaterally  Cardiac: S1 S2 present without murmurs  Abdomen:  +BS, large AP diameter, obese, soft. No rebound or guarding. Unable to appreciate HSM due to large body habitus Msk:  Symmetrical without gross deformities. Normal posture. Extremities:  Without edema. Neurologic:  Alert and  oriented x4 Psych:  Alert and cooperative. Normal mood and affect.

## 2019-07-11 ENCOUNTER — Encounter: Payer: Self-pay | Admitting: Gastroenterology

## 2019-07-11 DIAGNOSIS — K625 Hemorrhage of anus and rectum: Secondary | ICD-10-CM | POA: Insufficient documentation

## 2019-07-11 NOTE — Telephone Encounter (Signed)
I printed a letter for return to work at patient's request. Please fax to [phone]. Thanks!

## 2019-07-11 NOTE — Assessment & Plan Note (Addendum)
33 year old female with chronic diarrhea, worsening s/p cholecystectomy, now with intermittent low-volume hematochezia. 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 Extensive evaluation thus far including EGD earlier this year, CTs X 2, labs, negative stool studies, and even evaluation at [facility] due to persistent symptoms. Failure of multiple agents as described in HPI. [facility] felt symptoms were largely related to combination of IBS-D, GERD, and recommending colonoscopy and celiac serologies. Patient desires to have colonoscopy here with [facility].  Interestingly, she has gained over 40 lbs since initially seen prior to cholecystectomy in Jan 2020. With persistent diarrhea and now with low-volume rectal bleeding, needs to have diagnostic colonoscopy. Will also order celiac serologies, although I do not anticipate this will be abnormal but need to rule out. I highly suspect that stress, anxiety, depression are aggravating symptoms after discussing psychosocial aspects in more depth today. She will also need to establish care with a PCP for further assistance in non-GI health concerns.  Proceed with TCS with Dr. Gala Romney in near future: the risks, benefits, and alternatives have been discussed with the patient in detail. The patient states understanding and desires to proceed. Propofol due to BMI (64) Trial of Xifaxan for 2 weeks for IBS-D Celiac serologies ordered Imperative that she establishes care with PCP

## 2019-07-11 NOTE — Assessment & Plan Note (Signed)
Likely related to IBS-D. Imaging and labs on file. Continue with PPI due to history of esophagitis. Colonoscopy as planned. If colonoscopy unrevealing, would recommend considering  a tricyclic antidepressant in setting of known IBS-D and anxiety. She would need this prescribed by primary care, and she will be reaching out to establish care in the near future.

## 2019-07-11 NOTE — Assessment & Plan Note (Signed)
Likely benign anorectal source. Colonoscopy as planned.

## 2019-07-12 LAB — CELIAC DISEASE PANEL
(tTG) Ab, IgA: 1 U/mL
(tTG) Ab, IgG: 2 U/mL
Gliadin IgA: 10 Units
Gliadin IgG: 1 Units
Immunoglobulin A: 110 mg/dL (ref 47–310)

## 2019-07-16 NOTE — Telephone Encounter (Signed)
Majesti: celiac panel is negative. We will continue with plans for colonoscopy. Sent in [place]

## 2019-07-17 ENCOUNTER — Telehealth: Payer: Self-pay

## 2019-07-17 NOTE — Telephone Encounter (Signed)
Pt was placed on cancellation list for TCS if someone cancelled. Called pt, TCS w/Propofol w/RMR moved up to 07/23/19 at 7:30am. LMOVM for endo scheduler.

## 2019-07-18 ENCOUNTER — Encounter: Payer: Self-pay | Admitting: Gastroenterology

## 2019-07-18 NOTE — Telephone Encounter (Signed)
Elmo Putt or [name]: I wrote a letter for patient to be out of work on Friday and Monday due to COVID testing and colonoscopy. It has been printed and just needs to be faxed to :[phone] ATTN: Hillery Jacks.

## 2019-07-18 NOTE — Progress Notes (Signed)
Anita Hodges or Anita Hodges: I wrote a letter for patient to be out of work on Friday and Monday due to COVID testing and colonoscopy. It just needs to be faxed to :[phone] ATTN: Hillery Jacks.

## 2019-07-18 NOTE — H&P (View-Only) (Signed)
Anita Hodges or [name]: I wrote a letter for patient to be out of work on Friday and Monday due to COVID testing and colonoscopy. It just needs to be faxed to :[phone] ATTN: Hillery Jacks.

## 2019-07-18 NOTE — Telephone Encounter (Signed)
Called and informed pt of pre-op appt 07/20/19 at 2:15pm, COVID test 07/20/19 at 3:20pm. Appt letter and new procedure instructions done (she has MyChart).

## 2019-07-19 ENCOUNTER — Telehealth: Payer: Self-pay | Admitting: *Deleted

## 2019-07-19 NOTE — Telephone Encounter (Signed)
Pt called in today stating that she needs a work note excusing her from work for dates Aug 27- Sept 7.  Her phone number is [phone].

## 2019-07-20 ENCOUNTER — Encounter: Payer: Self-pay | Admitting: Gastroenterology

## 2019-07-20 ENCOUNTER — Encounter (HOSPITAL_COMMUNITY)
Admission: RE | Admit: 2019-07-20 | Discharge: 2019-07-20 | Disposition: A | Discharge status: Home / Self Care | Payer: BC Managed Care – PPO | Source: Ambulatory Visit | Attending: Internal Medicine | Admitting: Internal Medicine

## 2019-07-20 ENCOUNTER — Other Ambulatory Visit: Payer: Self-pay

## 2019-07-20 ENCOUNTER — Other Ambulatory Visit (HOSPITAL_COMMUNITY)
Admission: RE | Admit: 2019-07-20 | Discharge: 2019-07-20 | Disposition: A | Discharge status: Home / Self Care | Payer: BC Managed Care – PPO | Source: Ambulatory Visit | Attending: Internal Medicine | Admitting: Internal Medicine

## 2019-07-20 DIAGNOSIS — Z20828 Contact with and (suspected) exposure to other viral communicable diseases: Secondary | ICD-10-CM | POA: Diagnosis not present

## 2019-07-20 DIAGNOSIS — Z01812 Encounter for preprocedural laboratory examination: Secondary | ICD-10-CM | POA: Diagnosis not present

## 2019-07-20 LAB — SARS CORONAVIRUS 2 (TAT 6-24 HRS): SARS Coronavirus 2: NEGATIVE

## 2019-07-20 NOTE — Telephone Encounter (Signed)
Lmom for pt to call me back. 

## 2019-07-20 NOTE — Telephone Encounter (Signed)
Angie: I completed note. It should be printed off.

## 2019-07-23 ENCOUNTER — Other Ambulatory Visit: Payer: Self-pay

## 2019-07-23 ENCOUNTER — Ambulatory Visit (HOSPITAL_COMMUNITY): Payer: BC Managed Care – PPO | Admitting: Anesthesiology

## 2019-07-23 ENCOUNTER — Encounter (HOSPITAL_COMMUNITY): Payer: Self-pay | Admitting: *Deleted

## 2019-07-23 ENCOUNTER — Encounter (HOSPITAL_COMMUNITY)
Admission: RE | Disposition: A | Discharge status: Home / Self Care | Payer: Self-pay | Source: Home / Self Care | Attending: Internal Medicine

## 2019-07-23 ENCOUNTER — Ambulatory Visit (HOSPITAL_COMMUNITY)
Admission: RE | Admit: 2019-07-23 | Discharge: 2019-07-23 | Disposition: A | Discharge status: Home / Self Care | Payer: BC Managed Care – PPO | Attending: Internal Medicine | Admitting: Internal Medicine

## 2019-07-23 DIAGNOSIS — Z7951 Long term (current) use of inhaled steroids: Secondary | ICD-10-CM | POA: Insufficient documentation

## 2019-07-23 DIAGNOSIS — F419 Anxiety disorder, unspecified: Secondary | ICD-10-CM | POA: Insufficient documentation

## 2019-07-23 DIAGNOSIS — G4730 Sleep apnea, unspecified: Secondary | ICD-10-CM | POA: Insufficient documentation

## 2019-07-23 DIAGNOSIS — K921 Melena: Secondary | ICD-10-CM | POA: Insufficient documentation

## 2019-07-23 DIAGNOSIS — Z6841 Body Mass Index (BMI) 40.0 and over, adult: Secondary | ICD-10-CM | POA: Insufficient documentation

## 2019-07-23 DIAGNOSIS — K621 Rectal polyp: Secondary | ICD-10-CM | POA: Diagnosis not present

## 2019-07-23 DIAGNOSIS — K625 Hemorrhage of anus and rectum: Secondary | ICD-10-CM

## 2019-07-23 DIAGNOSIS — Z79899 Other long term (current) drug therapy: Secondary | ICD-10-CM | POA: Diagnosis not present

## 2019-07-23 DIAGNOSIS — K219 Gastro-esophageal reflux disease without esophagitis: Secondary | ICD-10-CM | POA: Diagnosis not present

## 2019-07-23 DIAGNOSIS — Q438 Other specified congenital malformations of intestine: Secondary | ICD-10-CM | POA: Diagnosis not present

## 2019-07-23 DIAGNOSIS — K640 First degree hemorrhoids: Secondary | ICD-10-CM | POA: Diagnosis not present

## 2019-07-23 HISTORY — PX: POLYPECTOMY: SHX5525

## 2019-07-23 HISTORY — PX: COLONOSCOPY WITH PROPOFOL: SHX5780

## 2019-07-23 LAB — HCG, SERUM, QUALITATIVE: Preg, Serum: NEGATIVE

## 2019-07-23 SURGERY — COLONOSCOPY WITH PROPOFOL
Anesthesia: General

## 2019-07-23 MED ORDER — PROPOFOL 10 MG/ML IV BOLUS
INTRAVENOUS | Status: DC | PRN
  Administered 2019-07-23 (×2): 10 mg via INTRAVENOUS

## 2019-07-23 MED ORDER — PHENYLEPHRINE 40 MCG/ML (10ML) SYRINGE FOR IV PUSH (FOR BLOOD PRESSURE SUPPORT)
PREFILLED_SYRINGE | INTRAVENOUS | Status: AC
  Filled 2019-07-23: qty 10

## 2019-07-23 MED ORDER — KETAMINE HCL 50 MG/5ML IJ SOSY
PREFILLED_SYRINGE | INTRAMUSCULAR | Status: AC
  Filled 2019-07-23: qty 5

## 2019-07-23 MED ORDER — LACTATED RINGERS IV SOLN
Freq: Once | INTRAVENOUS | Status: AC
  Administered 2019-07-23: 08:00:00 via INTRAVENOUS

## 2019-07-23 MED ORDER — GLYCOPYRROLATE PF 0.2 MG/ML IJ SOSY
PREFILLED_SYRINGE | INTRAMUSCULAR | Status: AC
  Filled 2019-07-23: qty 2

## 2019-07-23 MED ORDER — KETAMINE HCL 10 MG/ML IJ SOLN
INTRAMUSCULAR | Status: DC | PRN
  Administered 2019-07-23 (×3): 10 mg via INTRAVENOUS

## 2019-07-23 MED ORDER — LIDOCAINE HCL (CARDIAC) PF 100 MG/5ML IV SOSY
PREFILLED_SYRINGE | INTRAVENOUS | Status: DC | PRN
  Administered 2019-07-23: 80 mg via INTRAVENOUS

## 2019-07-23 MED ORDER — CHLORHEXIDINE GLUCONATE CLOTH 2 % EX PADS: 6.0000 | MEDICATED_PAD | Freq: Once | CUTANEOUS | Status: DC

## 2019-07-23 MED ORDER — LIDOCAINE 2% (20 MG/ML) 5 ML SYRINGE
INTRAMUSCULAR | Status: AC
  Filled 2019-07-23: qty 10

## 2019-07-23 MED ORDER — GLYCOPYRROLATE 0.2 MG/ML IJ SOLN
INTRAMUSCULAR | Status: DC | PRN
  Administered 2019-07-23: 0.1 mg via INTRAVENOUS

## 2019-07-23 MED ORDER — PROPOFOL 10 MG/ML IV BOLUS
INTRAVENOUS | Status: AC
  Filled 2019-07-23: qty 160

## 2019-07-23 MED ORDER — LIDOCAINE 2% (20 MG/ML) 5 ML SYRINGE
INTRAMUSCULAR | Status: AC
  Filled 2019-07-23: qty 5

## 2019-07-23 MED ORDER — ONDANSETRON HCL 4 MG/2ML IJ SOLN: 4.0000 mg | Freq: Once | INTRAMUSCULAR | Status: DC | PRN

## 2019-07-23 MED ORDER — PROPOFOL 500 MG/50ML IV EMUL
INTRAVENOUS | Status: DC | PRN
  Administered 2019-07-23: 150 ug/kg/min via INTRAVENOUS

## 2019-07-23 NOTE — Transfer of Care (Signed)
Immediate Anesthesia Transfer of Care Note  Patient: Anita Hodges  Procedure(s) Performed: COLONOSCOPY WITH PROPOFOL (N/A ) POLYPECTOMY  Patient Location: PACU  Anesthesia Type:MAC  Level of Consciousness: awake and patient cooperative  Airway & Oxygen Therapy: Patient Spontanous Breathing  Post-op Assessment: Report given to RN and Post -op Vital signs reviewed and stable  Post vital signs: Reviewed and stable  Last Vitals:  Vitals Value Taken Time  BP 134/75 07/23/19 0805  Temp    Pulse 83 07/23/19 0806  Resp 26 07/23/19 0806  SpO2 98 % 07/23/19 0806  Vitals shown include unvalidated device data.  Last Pain:  Vitals:   07/23/19 0743  TempSrc: Oral  PainSc: 0-No pain         Complications: No apparent anesthesia complications

## 2019-07-23 NOTE — Anesthesia Preprocedure Evaluation (Signed)
Anesthesia Evaluation  Patient identified by MRN, date of birth, ID band Patient awake    Reviewed: Allergy & Precautions, NPO status , Patient's Chart, lab work & pertinent test results  Airway Mallampati: II  TM Distance: >3 FB     Dental no notable dental hx. (+) Teeth Intact   Pulmonary Sleep apnea: snoring. ,    Pulmonary exam normal breath sounds clear to auscultation       Cardiovascular hypertension,  Rhythm:Regular Rate:Normal     Neuro/Psych Anxiety    GI/Hepatic Neg liver ROS, GERD  Medicated and Controlled,  Endo/Other  Morbid obesity  Renal/GU negative Renal ROS     Musculoskeletal   Abdominal (+) + obese,   Peds  Hematology   Anesthesia Other Findings   Reproductive/Obstetrics                            Anesthesia Physical Anesthesia Plan  ASA: III  Anesthesia Plan: General   Post-op Pain Management:    Induction: Intravenous  PONV Risk Score and Plan:   Airway Management Planned: Nasal Cannula, Simple Face Mask and Natural Airway  Additional Equipment:   Intra-op Plan:   Post-operative Plan:   Informed Consent: I have reviewed the patients History and Physical, chart, labs and discussed the procedure including the risks, benefits and alternatives for the proposed anesthesia with the patient or authorized representative who has indicated his/her understanding and acceptance.     Dental advisory given  Plan Discussed with: CRNA and Surgeon  Anesthesia Plan Comments: (Possible GA with ETT was discussed.)       Anesthesia Quick Evaluation

## 2019-07-23 NOTE — Op Note (Signed)
[facility] Patient Name: Anita Hodges Procedure Date: 07/23/2019 7:06 AM MRN: [id number] Date of Birth: 11-24-1985 Attending MD: Norvel Richards , MD CSN: [id number] Age: 33 Admit Type: Outpatient Procedure:                Colonoscopy Indications:              Hematochezia Providers:                Norvel Richards, MD, Janeece Riggers, RN, Raphael Gibney, Technician Referring MD:             none Medicines:                Propofol per Anesthesia Complications:            No immediate complications. Estimated Blood Loss:     Estimated blood loss was minimal. Procedure:                Pre-Anesthesia Assessment:                           - Prior to the procedure, a History and Physical                            was performed, and patient medications and                            allergies were reviewed. The patient's tolerance of                            previous anesthesia was also reviewed. The risks                            and benefits of the procedure and the sedation                            options and risks were discussed with the patient.                            All questions were answered, and informed consent                            was obtained. Prior Anticoagulants: The patient has                            taken no previous anticoagulant or antiplatelet                            agents. ASA Grade Assessment: II - A patient with                            mild systemic disease. After reviewing the risks  and benefits, the patient was deemed in                            satisfactory condition to undergo the procedure.                           After obtaining informed consent, the colonoscope                            was passed under direct vision. Throughout the                            procedure, the patient's blood pressure, pulse, and                            oxygen saturations were  monitored continuously. The                            [id number] scope was introduced through                            the anus and advanced to the 5 cm into the ileum.                            The colonoscopy was performed without difficulty.                            The patient tolerated the procedure well. The                            quality of the bowel preparation was adequate. Scope In: Scope Out: 7:56:17 AM Scope Withdrawal Time: 0 hours 9 minutes 58 seconds  Findings:      The perianal and digital rectal examinations were normal.      Non-bleeding internal hemorrhoids were found during retroflexion. The       hemorrhoids were mild, small and Grade I (internal hemorrhoids that do       not prolapse).      A 3 mm polyp was found in the rectum. The polyp was sessile. The polyp       was removed with a cold snare. Resection and retrieval were complete.       Estimated blood loss was minimal. Colon somewhat elongated and redundant.      The exam was otherwise without abnormality on direct and retroflexion       views. Distal 5 cm of TI appeared normal as well. Impression:               - Non-bleeding internal hemorrhoids.                           - One 3 mm polyp in the rectum, removed with a cold                            snare. Resected and retrieved. Mildly redundant  colon                           - The examination was otherwise normal on direct                            and retroflexion views. Suspect trivial bleeding                            from hemorrhoids. Moderate Sedation:      Moderate (conscious) sedation was personally administered by an       anesthesia professional. The following parameters were monitored: oxygen       saturation, heart rate, blood pressure, respiratory rate, EKG, adequacy       of pulmonary ventilation, and response to care. Recommendation:           - Patient has a contact number available for                             emergencies. The signs and symptoms of potential                            delayed complications were discussed with the                            patient. Return to normal activities tomorrow.                            Written discharge instructions were provided to the                            patient.                           - Resume previous diet.                           - Continue present medications. Course of Anusol HC                            cream. Hemorrhoid Banding Provided.                           - Repeat colonoscopy date to be determined after                            pending pathology results are reviewed for                            surveillance.                           - Return to GI office in 3 months. Procedure Code(s):        --- Professional ---                           [phone], Colonoscopy,  flexible; with removal of                            tumor(s), polyp(s), or other lesion(s) by snare                            technique Diagnosis Code(s):        --- Professional ---                           K62.1, Rectal polyp                           K64.0, First degree hemorrhoids                           K92.1, Melena (includes Hematochezia) CPT copyright 2019 American Medical Association. All rights reserved. The codes documented in this report are preliminary and upon coder review may  be revised to meet current compliance requirements. Cristopher Estimable. Bre Pecina, MD Norvel Richards, MD 07/23/2019 8:09:03 AM This report has been signed electronically. Number of Addenda: 0

## 2019-07-23 NOTE — Telephone Encounter (Signed)
Spoke with pt and she is aware that letter is ready.  Pt requested it to be sent to [employer] fax: [phone].  Faxed per pt request.

## 2019-07-23 NOTE — Discharge Instructions (Signed)
°  Colonoscopy Discharge Instructions  Read the instructions outlined below and refer to this sheet in the next few weeks. These discharge instructions provide you with general information on caring for yourself after you leave the hospital. Your doctor may also give you specific instructions. While your treatment has been planned according to the most current medical practices available, unavoidable complications occasionally occur. If you have any problems or questions after discharge, call Dr. Gala Romney at [phone]. ACTIVITY  You may resume your regular activity, but move at a slower pace for the next 24 hours.   Take frequent rest periods for the next 24 hours.   Walking will help get rid of the air and reduce the bloated feeling in your belly (abdomen).   No driving for 24 hours (because of the medicine (anesthesia) used during the test).    Do not sign any important legal documents or operate any machinery for 24 hours (because of the anesthesia used during the test).  NUTRITION  Drink plenty of fluids.   You may resume your normal diet as instructed by your doctor.   Begin with a light meal and progress to your normal diet. Heavy or fried foods are harder to digest and may make you feel sick to your stomach (nauseated).   Avoid alcoholic beverages for 24 hours or as instructed.  MEDICATIONS  You may resume your normal medications unless your doctor tells you otherwise.  WHAT YOU CAN EXPECT TODAY  Some feelings of bloating in the abdomen.   Passage of more gas than usual.   Spotting of blood in your stool or on the toilet paper.  IF YOU HAD POLYPS REMOVED DURING THE COLONOSCOPY:  No aspirin products for 7 days or as instructed.   No alcohol for 7 days or as instructed.   Eat a soft diet for the next 24 hours.  FINDING OUT THE RESULTS OF YOUR TEST Not all test results are available during your visit. If your test results are not back during the visit, make an appointment  with your caregiver to find out the results. Do not assume everything is normal if you have not heard from your caregiver or the medical facility. It is important for you to follow up on all of your test results.  SEEK IMMEDIATE MEDICAL ATTENTION IF:  You have more than a spotting of blood in your stool.   Your belly is swollen (abdominal distention).   You are nauseated or vomiting.   You have a temperature over 101.   You have abdominal pain or discomfort that is severe or gets worse throughout the day.   Hemorrhoid, colon polyp information provided  Anusol cream applied to the anorectum 3 times daily  Pamphlet on hemorrhoid banding provided  Further recommendations to follow pending review of pathology report  Office visit with [place] in 3 months   I called Denyse Amass, at patient's request, [phone].  Got voicemail left message with information.

## 2019-07-23 NOTE — Anesthesia Postprocedure Evaluation (Signed)
Anesthesia Post Note  Patient: Anita Hodges  Procedure(s) Performed: COLONOSCOPY WITH PROPOFOL (N/A ) POLYPECTOMY  Patient location during evaluation: PACU Anesthesia Type: MAC Level of consciousness: awake, awake and alert and oriented Pain management: pain level controlled Vital Signs Assessment: post-procedure vital signs reviewed and stable Respiratory status: spontaneous breathing Cardiovascular status: stable Postop Assessment: no apparent nausea or vomiting Anesthetic complications: no     Last Vitals:  Vitals:   07/23/19 0656 07/23/19 0804  BP: (!) (P) 149/83 (P) 134/75  Pulse: (P) 85 (P) 81  Resp: (P) 20 (!) (P) 25  Temp: (P) 37.1 C (P) 36.9 C  SpO2: (P) 96% (P) 98%    Last Pain:  Vitals:   07/23/19 0743  TempSrc: Oral  PainSc: 0-No pain                 Everette Rank

## 2019-07-23 NOTE — Interval H&P Note (Signed)
History and Physical Interval Note:  07/23/2019 7:31 AM  Anita Hodges  has presented today for surgery, with the diagnosis of rectal bleeding.  The various methods of treatment have been discussed with the patient and family. After consideration of risks, benefits and other options for treatment, the patient has consented to  Procedure(s) with comments: COLONOSCOPY WITH PROPOFOL (N/A) - 1:15pm as a surgical intervention.  The patient's history has been reviewed, patient examined, no change in status, stable for surgery.  I have reviewed the patient's chart and labs.  Questions were answered to the patient's satisfaction.     Anita Hodges  No change.  Diagnostic colonoscopy per plan.  The risks, benefits, limitations, alternatives and imponderables have been reviewed with the patient. Questions have been answered. All parties are agreeable.

## 2019-07-25 ENCOUNTER — Encounter (HOSPITAL_COMMUNITY): Payer: Self-pay | Admitting: Internal Medicine

## 2019-07-25 ENCOUNTER — Encounter: Payer: Self-pay | Admitting: Internal Medicine

## 2019-08-01 ENCOUNTER — Encounter: Payer: BC Managed Care – PPO | Admitting: Obstetrics & Gynecology

## 2019-08-06 NOTE — Telephone Encounter (Signed)
[name], please see [name] message below. Thanks!

## 2019-08-07 ENCOUNTER — Telehealth: Payer: Self-pay | Admitting: Internal Medicine

## 2019-08-07 NOTE — Telephone Encounter (Signed)
Lmom, waiting on a return call.  

## 2019-08-07 NOTE — Telephone Encounter (Signed)
[phone] [facility] EXT [id number] DISABILITY HAS A QUESTION ABOUT THE PATIENT-PLEASE CALL THEM WHEN YOU CAN

## 2019-08-16 ENCOUNTER — Telehealth: Payer: Self-pay | Admitting: *Deleted

## 2019-08-16 NOTE — Telephone Encounter (Signed)
At visit on 03/19/2019 I added Xanax for anxiety.  Is she using it?

## 2019-08-16 NOTE — Telephone Encounter (Signed)
Patient called asking to switch from Zoloft 100 mg tablets takes for anxiety, reports its not working, discussed this at office visit 03/19/19. Patient said you wanted to labs done before switching medication? I didn't see any labs you ordered, patient said she has had lots of labs done in the last couple of months in epic. Asked if those labs are some you wanted to check so medication can be switched. Please advise

## 2019-08-17 NOTE — Telephone Encounter (Signed)
Please refer to Fam MD to manage anxiety.

## 2019-08-17 NOTE — Telephone Encounter (Signed)
Patient said the Xanax 0.25 mg tablet was only #30 tablets, she is no longer using those, they are all gone. States she doesn't want to take Xanax daily, reports the xanax helped when she felt overwhelmed. She feels the need to take Rx daily for anxiety.

## 2019-08-17 NOTE — Telephone Encounter (Signed)
Patient informed with below.  

## 2019-08-22 ENCOUNTER — Other Ambulatory Visit: Payer: Self-pay | Admitting: Obstetrics & Gynecology

## 2019-08-22 NOTE — Telephone Encounter (Signed)
Annual exam scheduled on 10/14/19

## 2019-09-05 DIAGNOSIS — R112 Nausea with vomiting, unspecified: Secondary | ICD-10-CM | POA: Diagnosis not present

## 2019-09-05 DIAGNOSIS — G43819 Other migraine, intractable, without status migrainosus: Secondary | ICD-10-CM | POA: Diagnosis not present

## 2019-09-05 DIAGNOSIS — Z9049 Acquired absence of other specified parts of digestive tract: Secondary | ICD-10-CM | POA: Diagnosis not present

## 2019-09-07 ENCOUNTER — Other Ambulatory Visit (HOSPITAL_COMMUNITY): Payer: BC Managed Care – PPO

## 2019-09-11 ENCOUNTER — Telehealth: Payer: Self-pay | Admitting: Gastroenterology

## 2019-09-11 NOTE — Telephone Encounter (Signed)
The last thing I received was faxed back on 08/16/19. I have not received anything else.

## 2019-09-11 NOTE — Telephone Encounter (Signed)
There are some other forms from [employer] that were done in September. Nothing else has been received.

## 2019-09-11 NOTE — Telephone Encounter (Signed)
PATIENT CALLED([phone]) AND SAID [employer] (SHORT TERM DISABILITY)  WAS SUPPOSED TO SEND ADDITIONAL PAPERS THEY NEEDED FILLED OUT FOR HER SHORT TERM DISABILITY AND THEY NEVER RECEIVED THEM BACK.  WERE ADDITIONAL PAPERS SENT HERE?

## 2019-09-11 NOTE — Telephone Encounter (Signed)
Forwarding to Julie.  

## 2019-09-12 DIAGNOSIS — H4710 Unspecified papilledema: Secondary | ICD-10-CM | POA: Diagnosis not present

## 2019-09-12 DIAGNOSIS — G43709 Chronic migraine without aura, not intractable, without status migrainosus: Secondary | ICD-10-CM | POA: Diagnosis not present

## 2019-09-12 DIAGNOSIS — R519 Headache, unspecified: Secondary | ICD-10-CM | POA: Diagnosis not present

## 2019-09-12 NOTE — Telephone Encounter (Signed)
Called patient and told her, she said she will reach out to the company again and have them resend the additional paperwork they needed filled out

## 2019-09-12 NOTE — Telephone Encounter (Signed)
I haven't received anything recently

## 2019-09-18 DIAGNOSIS — R635 Abnormal weight gain: Secondary | ICD-10-CM | POA: Diagnosis not present

## 2019-09-18 DIAGNOSIS — R5383 Other fatigue: Secondary | ICD-10-CM | POA: Diagnosis not present

## 2019-09-18 DIAGNOSIS — F332 Major depressive disorder, recurrent severe without psychotic features: Secondary | ICD-10-CM | POA: Diagnosis not present

## 2019-09-18 DIAGNOSIS — R519 Headache, unspecified: Secondary | ICD-10-CM | POA: Diagnosis not present

## 2019-09-19 ENCOUNTER — Encounter: Payer: Self-pay | Admitting: General Practice

## 2019-09-19 NOTE — Telephone Encounter (Addendum)
Reviewed please route to the doctor for discharge order

## 2019-09-19 NOTE — Telephone Encounter (Signed)
This is a RMR pt. Will route to him

## 2019-09-19 NOTE — Telephone Encounter (Signed)
Discharge letter mailed  

## 2019-09-19 NOTE — Telephone Encounter (Signed)
Glad her evaluation is proceeding elsewhere.  Yes, we have to clearly define by her doctor-patient relationship.  Since she is going somewhere else.  It needs to be clearly documented via the letter.  I wish her good luck.

## 2019-09-19 NOTE — Telephone Encounter (Signed)
Finally received paperwork, it has been filled out and placed on Anita Hodges's desk. I spoke with the patient yesterday around 4:45pm, informed her that I have the paperwork and asked if she was currently working, she said she is but still having a hard time with nausea, no mention of any diarrhea, She also told me that she decided to seek a second opinion and has been seen by Dr.Pakala, GI with [facility], who feels like all her problems are neurological and has sent her to see a Neurologist, who has her scheduled for a MRI this week. I advised the pt that I can only fill out paperwork for the time that we saw her and she said she was aware and the paperwork that was sent to [place] was for the time in September when she was out of work after we saw her, she said it was a follow up to the original paperwork we sent in.

## 2019-09-20 DIAGNOSIS — G43909 Migraine, unspecified, not intractable, without status migrainosus: Secondary | ICD-10-CM | POA: Diagnosis not present

## 2019-09-20 DIAGNOSIS — R162 Hepatomegaly with splenomegaly, not elsewhere classified: Secondary | ICD-10-CM | POA: Diagnosis not present

## 2019-09-20 DIAGNOSIS — K760 Fatty (change of) liver, not elsewhere classified: Secondary | ICD-10-CM | POA: Diagnosis not present

## 2019-09-20 DIAGNOSIS — R1011 Right upper quadrant pain: Secondary | ICD-10-CM | POA: Diagnosis not present

## 2019-10-01 DIAGNOSIS — G4733 Obstructive sleep apnea (adult) (pediatric): Secondary | ICD-10-CM | POA: Diagnosis not present

## 2019-10-01 DIAGNOSIS — G4719 Other hypersomnia: Secondary | ICD-10-CM | POA: Diagnosis not present

## 2019-10-01 DIAGNOSIS — G43709 Chronic migraine without aura, not intractable, without status migrainosus: Secondary | ICD-10-CM | POA: Diagnosis not present

## 2019-10-01 DIAGNOSIS — F332 Major depressive disorder, recurrent severe without psychotic features: Secondary | ICD-10-CM | POA: Diagnosis not present

## 2019-10-01 DIAGNOSIS — I10 Essential (primary) hypertension: Secondary | ICD-10-CM | POA: Diagnosis not present

## 2019-10-01 DIAGNOSIS — R5382 Chronic fatigue, unspecified: Secondary | ICD-10-CM | POA: Diagnosis not present

## 2019-10-02 DIAGNOSIS — R519 Headache, unspecified: Secondary | ICD-10-CM | POA: Diagnosis not present

## 2019-10-02 DIAGNOSIS — H4710 Unspecified papilledema: Secondary | ICD-10-CM | POA: Diagnosis not present

## 2019-10-18 DIAGNOSIS — K760 Fatty (change of) liver, not elsewhere classified: Secondary | ICD-10-CM | POA: Diagnosis not present

## 2019-10-18 DIAGNOSIS — R7989 Other specified abnormal findings of blood chemistry: Secondary | ICD-10-CM | POA: Diagnosis not present

## 2019-10-19 DIAGNOSIS — F332 Major depressive disorder, recurrent severe without psychotic features: Secondary | ICD-10-CM | POA: Diagnosis not present

## 2019-10-19 DIAGNOSIS — I10 Essential (primary) hypertension: Secondary | ICD-10-CM | POA: Diagnosis not present

## 2019-10-24 ENCOUNTER — Encounter: Payer: BC Managed Care – PPO | Admitting: Obstetrics & Gynecology

## 2019-10-30 NOTE — Telephone Encounter (Signed)
Discharge letter was returned "unclaimed", letter has been resent to the pt via [place].

## 2019-11-06 DIAGNOSIS — G932 Benign intracranial hypertension: Secondary | ICD-10-CM | POA: Diagnosis not present

## 2019-11-21 DIAGNOSIS — M791 Myalgia, unspecified site: Secondary | ICD-10-CM | POA: Diagnosis not present

## 2019-11-21 DIAGNOSIS — Z23 Encounter for immunization: Secondary | ICD-10-CM | POA: Diagnosis not present

## 2019-11-21 DIAGNOSIS — R5382 Chronic fatigue, unspecified: Secondary | ICD-10-CM | POA: Diagnosis not present

## 2019-11-21 DIAGNOSIS — R162 Hepatomegaly with splenomegaly, not elsewhere classified: Secondary | ICD-10-CM | POA: Diagnosis not present

## 2019-11-21 DIAGNOSIS — H04123 Dry eye syndrome of bilateral lacrimal glands: Secondary | ICD-10-CM | POA: Diagnosis not present

## 2019-11-23 DIAGNOSIS — F332 Major depressive disorder, recurrent severe without psychotic features: Secondary | ICD-10-CM | POA: Diagnosis not present

## 2019-11-23 DIAGNOSIS — I10 Essential (primary) hypertension: Secondary | ICD-10-CM | POA: Diagnosis not present

## 2019-11-23 DIAGNOSIS — G43709 Chronic migraine without aura, not intractable, without status migrainosus: Secondary | ICD-10-CM | POA: Diagnosis not present

## 2019-12-05 DIAGNOSIS — R0602 Shortness of breath: Secondary | ICD-10-CM | POA: Diagnosis not present

## 2019-12-05 DIAGNOSIS — F419 Anxiety disorder, unspecified: Secondary | ICD-10-CM | POA: Diagnosis not present

## 2019-12-05 DIAGNOSIS — R519 Headache, unspecified: Secondary | ICD-10-CM | POA: Diagnosis not present

## 2019-12-05 DIAGNOSIS — E559 Vitamin D deficiency, unspecified: Secondary | ICD-10-CM | POA: Diagnosis not present

## 2019-12-05 DIAGNOSIS — R5382 Chronic fatigue, unspecified: Secondary | ICD-10-CM | POA: Diagnosis not present

## 2019-12-05 DIAGNOSIS — M545 Low back pain: Secondary | ICD-10-CM | POA: Diagnosis not present

## 2019-12-05 DIAGNOSIS — R700 Elevated erythrocyte sedimentation rate: Secondary | ICD-10-CM | POA: Diagnosis not present

## 2019-12-05 DIAGNOSIS — K760 Fatty (change of) liver, not elsewhere classified: Secondary | ICD-10-CM | POA: Diagnosis not present

## 2019-12-05 DIAGNOSIS — R21 Rash and other nonspecific skin eruption: Secondary | ICD-10-CM | POA: Diagnosis not present

## 2019-12-06 DIAGNOSIS — R519 Headache, unspecified: Secondary | ICD-10-CM | POA: Diagnosis not present

## 2019-12-06 DIAGNOSIS — R700 Elevated erythrocyte sedimentation rate: Secondary | ICD-10-CM | POA: Diagnosis not present

## 2019-12-06 DIAGNOSIS — J9809 Other diseases of bronchus, not elsewhere classified: Secondary | ICD-10-CM | POA: Diagnosis not present

## 2019-12-06 DIAGNOSIS — R0602 Shortness of breath: Secondary | ICD-10-CM | POA: Diagnosis not present

## 2019-12-06 DIAGNOSIS — R21 Rash and other nonspecific skin eruption: Secondary | ICD-10-CM | POA: Diagnosis not present

## 2019-12-06 DIAGNOSIS — I10 Essential (primary) hypertension: Secondary | ICD-10-CM | POA: Diagnosis not present

## 2019-12-06 DIAGNOSIS — R590 Localized enlarged lymph nodes: Secondary | ICD-10-CM | POA: Diagnosis not present

## 2019-12-06 DIAGNOSIS — F419 Anxiety disorder, unspecified: Secondary | ICD-10-CM | POA: Diagnosis not present

## 2019-12-06 DIAGNOSIS — R5382 Chronic fatigue, unspecified: Secondary | ICD-10-CM | POA: Diagnosis not present

## 2019-12-12 DIAGNOSIS — R519 Headache, unspecified: Secondary | ICD-10-CM | POA: Diagnosis not present

## 2019-12-12 DIAGNOSIS — F332 Major depressive disorder, recurrent severe without psychotic features: Secondary | ICD-10-CM | POA: Diagnosis not present

## 2019-12-12 DIAGNOSIS — G8929 Other chronic pain: Secondary | ICD-10-CM | POA: Diagnosis not present

## 2019-12-12 DIAGNOSIS — I10 Essential (primary) hypertension: Secondary | ICD-10-CM | POA: Diagnosis not present

## 2019-12-13 ENCOUNTER — Ambulatory Visit
Admission: EM | Admit: 2019-12-13 | Discharge: 2019-12-13 | Disposition: A | Discharge status: Home / Self Care | Payer: BC Managed Care – PPO

## 2019-12-13 ENCOUNTER — Other Ambulatory Visit: Payer: Self-pay

## 2019-12-13 DIAGNOSIS — Z20822 Contact with and (suspected) exposure to covid-19: Secondary | ICD-10-CM | POA: Diagnosis not present

## 2019-12-13 DIAGNOSIS — R062 Wheezing: Secondary | ICD-10-CM

## 2019-12-13 LAB — POC SARS CORONAVIRUS 2 AG -  ED: SARS Coronavirus 2 Ag: NEGATIVE

## 2019-12-13 MED ORDER — PREDNISONE 10 MG (21) PO TBPK: ORAL_TABLET | ORAL | 0 refills | Status: DC

## 2019-12-13 MED ORDER — BENZONATATE 100 MG PO CAPS: 100.0000 mg | ORAL_CAPSULE | Freq: Three times a day (TID) | ORAL | 0 refills | Status: DC

## 2019-12-13 MED ORDER — AZITHROMYCIN 250 MG PO TABS: 250.0000 mg | ORAL_TABLET | Freq: Every day | ORAL | 0 refills | Status: DC

## 2019-12-13 MED ORDER — ALBUTEROL SULFATE HFA 108 (90 BASE) MCG/ACT IN AERS: 1.0000 | INHALATION_SPRAY | Freq: Four times a day (QID) | RESPIRATORY_TRACT | 0 refills | Status: DC | PRN

## 2019-12-13 MED ORDER — FLUTICASONE PROPIONATE 50 MCG/ACT NA SUSP: 1.0000 | Freq: Every day | NASAL | 0 refills | Status: DC

## 2019-12-13 NOTE — ED Triage Notes (Addendum)
Pt presents to UC w/ c/o cough, sob x1 week. Pt is audibly wheezing.

## 2019-12-13 NOTE — Discharge Instructions (Addendum)
POCT COVID-19 test was negative COVID testing ordered.  It will take between 2-7 days for test results.  Someone will contact you regarding abnormal results.    In the meantime: You should remain isolated in your home for 10 days from symptom onset AND greater than 72 hours after symptoms resolution (absence of fever without the use of fever-reducing medication and improvement in respiratory symptoms), whichever is longer Get plenty of rest and push fluids Use medications daily for symptom relief Use OTC medications like ibuprofen or tylenol as needed fever or pain Call or go to the ED if you have any new or worsening symptoms such as fever, worsening cough, shortness of breath, chest tightness, chest pain, turning blue, changes in mental status, etc..Marland Kitchen

## 2019-12-13 NOTE — ED Provider Notes (Signed)
[facility]    CSN: [id number] Arrival date & time: 12/13/19  1648      History   Chief Complaint Chief Complaint  Patient presents with  . Shortness of Breath  . Cough  . Wheezing    HPI Anita Hodges is a 34 y.o. female.   who presents to the urgent care for complaint of cough, shortness of breath and wheezing for the past 1 week.  Denies sick exposure to COVID, flu or strep.  Denies recent travel.  Denies aggravating or alleviating symptoms.  Denies previous COVID infection.   Denies fever, chills, fatigue, nasal congestion, rhinorrhea, sore throat, chest pain, nausea, vomiting, changes in bowel or bladder habits.     The history is provided by the patient. No language interpreter was used.  Shortness of Breath Associated symptoms: cough and wheezing   Cough Associated symptoms: shortness of breath and wheezing   Wheezing Associated symptoms: cough and shortness of breath     Past Medical History:  Diagnosis Date  . Allergy   . Anxiety   . Hypertension   . Obesity     Patient Active Problem List   Diagnosis Date Noted  . Rectal bleeding 07/11/2019  . Abdominal pain 07/10/2019  . Pain of upper abdomen 04/24/2019  . Diarrhea 04/24/2019  . Chronic cholecystitis   . Right upper quadrant abdominal pain 12/29/2017  . IUD (intrauterine device) in place 07/19/2016  . Pedal edema 06/12/2014  . Overweight(278.02) 01/29/2013    Past Surgical History:  Procedure Laterality Date  . CHOLECYSTECTOMY N/A 12/27/2018   Procedure: LAPAROSCOPIC CHOLECYSTECTOMY;  Surgeon: Aviva Signs, MD;  Location: [facility];  Service: General;  Laterality: N/A;  pt knows to arrive at 6:15  . COLONOSCOPY WITH PROPOFOL N/A 07/23/2019   Procedure: COLONOSCOPY WITH PROPOFOL;  Surgeon: Daneil Dolin, MD;  Location: [facility];  Service: Endoscopy;  Laterality: N/A;  1:15pm  . ESOPHAGOGASTRODUODENOSCOPY (EGD) WITH PROPOFOL N/A 12/18/2018   mild erosive reflux esophagitis, normal  stomach, normal duodenum.   Marland Kitchen POLYPECTOMY  07/23/2019   Procedure: POLYPECTOMY;  Surgeon: Daneil Dolin, MD;  Location: [facility];  Service: Endoscopy;;  . TONSILLECTOMY    . WISDOM TOOTH EXTRACTION      OB History    Gravida  0   Para  0   Term  0   Preterm  0   AB  0   Living  0     SAB  0   TAB  0   Ectopic  0   Multiple  0   Live Births               Home Medications    Prior to Admission medications   Medication Sig Start Date End Date Taking? Authorizing Provider  amLODipine (NORVASC) 10 MG tablet Take 10 mg by mouth daily.   Yes [provider]  albuterol (VENTOLIN HFA) 108 (90 Base) MCG/ACT inhaler Inhale 1-2 puffs into the lungs every 6 (six) hours as needed for wheezing or shortness of breath. 12/13/19   Kayston Jodoin, Darrelyn Hillock, FNP  ALPRAZolam (XANAX) 0.25 MG tablet Take 1 tablet (0.25 mg total) by mouth daily as needed for anxiety. 03/19/19   Princess Bruins, MD  azithromycin (ZITHROMAX) 250 MG tablet Take 1 tablet (250 mg total) by mouth daily. Take first 2 tablets together, then 1 every day until finished. 12/13/19   Kimorah Ridolfi, Darrelyn Hillock, FNP  benzonatate (TESSALON) 100 MG capsule Take 1 capsule (100  mg total) by mouth every 8 (eight) hours. 12/13/19   Mirelle Biskup, Darrelyn Hillock, FNP  budesonide-formoterol (SYMBICORT) 160-4.5 MCG/ACT inhaler Inhale 2 puffs into the lungs 2 (two) times daily.    [provider]  fluticasone (FLONASE) 50 MCG/ACT nasal spray Place 1 spray into both nostrils daily for 14 days. 12/13/19 12/27/19  Amayah Staheli, Darrelyn Hillock, FNP  hyoscyamine (LEVSIN SL) 0.125 MG SL tablet Place 1 tablet (0.125 mg total) under the tongue every 4 (four) hours as needed. For abdominal cramping. Do not take with Bentyl. 05/23/19   Annitta Needs, NP  levocetirizine (XYZAL) 5 MG tablet Take 5 mg by mouth every morning.     [provider]  montelukast (SINGULAIR) 10 MG tablet Take 10 mg by mouth at bedtime.    [provider]   nitrofurantoin, macrocrystal-monohydrate, (MACROBID) 100 MG capsule Take 1 capsule (100 mg total) by mouth 2 (two) times daily. 07/03/19   Evalee Jefferson, PA-C  ondansetron (ZOFRAN) 4 MG tablet Take 1 tablet (4 mg total) by mouth every 8 (eight) hours as needed for nausea or vomiting. 04/24/19   Annitta Needs, NP  pantoprazole (PROTONIX) 40 MG tablet Take 1 tablet (40 mg total) by mouth 2 (two) times daily before a meal. 04/24/19   Annitta Needs, NP  polyethylene glycol-electrolytes (TRILYTE) 420 g solution Take 4,000 mLs by mouth as directed. 07/10/19   Rourk, Cristopher Estimable, MD  predniSONE (STERAPRED UNI-PAK 21 TAB) 10 MG (21) TBPK tablet Take 6 tabs by mouth daily  for 2 days, then 5 tabs for 2 days, then 4 tabs for 2 days, then 3 tabs for 2 days, 2 tabs for 2 days, then 1 tab by mouth daily for 2 days 12/13/19   Emerson Monte, FNP  promethazine (PHENERGAN) 25 MG tablet Take 0.5 tablets (12.5 mg total) by mouth every 6 (six) hours as needed for nausea or vomiting. 12/05/18   Annitta Needs, NP  sertraline (ZOLOFT) 100 MG tablet TAKE 1 TABLET ONCE DAILY. 08/23/19   Princess Bruins, MD    Family History Family History  Problem Relation Age of Onset  . Hypertension Mother   . Colon polyps Mother        unknown age of onset  . Gallbladder disease Mother   . Diabetes Father   . Gallbladder disease Father   . Cancer Maternal Grandfather        ?  Marland Kitchen Cancer Paternal Grandmother        ?  . Colon cancer Neg Hx     Social History Social History   Tobacco Use  . Smoking status: Never Smoker  . Smokeless tobacco: Never Used  Substance Use Topics  . Alcohol use: No    Alcohol/week: 0.0 standard drinks  . Drug use: No     Allergies   Codeine   Review of Systems Review of Systems  Constitutional: Negative.   HENT: Negative.   Respiratory: Positive for cough, shortness of breath and wheezing.   Cardiovascular: Negative.   Gastrointestinal: Negative.   Neurological: Negative.       Physical Exam Triage Vital Signs ED Triage Vitals  Enc Vitals Group     BP 12/13/19 1722 (!) 155/98     Pulse Rate 12/13/19 1713 (!) 112     Resp 12/13/19 1713 16     Temp 12/13/19 1713 98.5 F (36.9 C)     Temp Source 12/13/19 1713 Oral     SpO2 12/13/19 1713 95 %  Weight --      Height --      Head Circumference --      Peak Flow --      Pain Score 12/13/19 1716 0     Pain Loc --      Pain Edu? --      Excl. in [place]? --    No data found.  Updated Vital Signs BP (!) 155/98 (BP Location: Right Wrist)   Pulse (!) 112   Temp 98.5 F (36.9 C) (Oral)   Resp 16   SpO2 95%   Visual Acuity Right Eye Distance:   Left Eye Distance:   Bilateral Distance:    Right Eye Near:   Left Eye Near:    Bilateral Near:     Physical Exam Vitals and nursing note reviewed.  Constitutional:      General: She is not in acute distress.    Appearance: Normal appearance. She is normal weight. She is not ill-appearing or toxic-appearing.  HENT:     Head: Normocephalic.     Right Ear: Tympanic membrane, ear canal and external ear normal. There is no impacted cerumen.     Left Ear: Tympanic membrane, ear canal and external ear normal. There is no impacted cerumen.     Nose: Nose normal. No congestion.     Mouth/Throat:     Mouth: Mucous membranes are moist.     Pharynx: Oropharynx is clear. No oropharyngeal exudate or posterior oropharyngeal erythema.  Cardiovascular:     Rate and Rhythm: Normal rate and regular rhythm.     Pulses: Normal pulses.     Heart sounds: Normal heart sounds. No murmur.  Pulmonary:     Effort: Pulmonary effort is normal. No respiratory distress.     Breath sounds: Normal breath sounds. No wheezing or rhonchi.  Chest:     Chest wall: No tenderness.  Abdominal:     General: Abdomen is flat. Bowel sounds are normal. There is no distension.     Palpations: There is no mass.     Tenderness: There is no abdominal tenderness.  Skin:    Capillary Refill:  Capillary refill takes less than 2 seconds.  Neurological:     General: No focal deficit present.     Mental Status: She is alert and oriented to person, place, and time.      UC Treatments / Results  Labs (all labs ordered are listed, but only abnormal results are displayed) Labs Reviewed  NOVEL CORONAVIRUS, NAA  POC SARS CORONAVIRUS 2 AG -  ED    EKG   Radiology No results found.  Procedures Procedures (including critical care time)  Medications Ordered in UC Medications - No data to display  Initial Impression / Assessment and Plan / UC Course  I have reviewed the triage vital signs and the nursing notes.  Pertinent labs & imaging results that were available during my care of the patient were reviewed by me and considered in my medical decision making (see chart for details).    Patient stable at discharge POCT COVID-19 test was negative [facility] COVID-19 test was ordered Advised patient to quarantine Prednisone taper was prescribed ProAir was prescribed Tessalon Perles was prescribed  Flonase was prescribed Azithromycin as prescribed for possible bronchitis Advised patient to go to ED for worsening of symptoms  Final Clinical Impressions(s) / UC Diagnoses   Final diagnoses:  Suspected COVID-19 virus infection  Wheezing     Discharge Instructions     POCT  COVID-19 test was negative COVID testing ordered.  It will take between 2-7 days for test results.  Someone will contact you regarding abnormal results.    In the meantime: You should remain isolated in your home for 10 days from symptom onset AND greater than 72 hours after symptoms resolution (absence of fever without the use of fever-reducing medication and improvement in respiratory symptoms), whichever is longer Get plenty of rest and push fluids Use medications daily for symptom relief Use OTC medications like ibuprofen or tylenol as needed fever or pain Call or go to the ED if you have any new  or worsening symptoms such as fever, worsening cough, shortness of breath, chest tightness, chest pain, turning blue, changes in mental status, etc...     ED Prescriptions    Medication Sig Dispense Auth. Provider   predniSONE (STERAPRED UNI-PAK 21 TAB) 10 MG (21) TBPK tablet Take 6 tabs by mouth daily  for 2 days, then 5 tabs for 2 days, then 4 tabs for 2 days, then 3 tabs for 2 days, 2 tabs for 2 days, then 1 tab by mouth daily for 2 days 42 tablet Ovadia Lopp S, FNP   fluticasone (FLONASE) 50 MCG/ACT nasal spray Place 1 spray into both nostrils daily for 14 days. 16 g Heaton Sarin S, FNP   benzonatate (TESSALON) 100 MG capsule Take 1 capsule (100 mg total) by mouth every 8 (eight) hours. 21 capsule Lacreasha Hinds S, FNP   albuterol (VENTOLIN HFA) 108 (90 Base) MCG/ACT inhaler Inhale 1-2 puffs into the lungs every 6 (six) hours as needed for wheezing or shortness of breath. 18 g Barrington Worley, Darrelyn Hillock, FNP   azithromycin (ZITHROMAX) 250 MG tablet Take 1 tablet (250 mg total) by mouth daily. Take first 2 tablets together, then 1 every day until finished. 6 tablet Latiqua Daloia, Darrelyn Hillock, FNP     PDMP not reviewed this encounter.   Emerson Monte, [place] 12/13/19 1759

## 2019-12-14 LAB — NOVEL CORONAVIRUS, NAA: SARS-CoV-2, NAA: NOT DETECTED

## 2019-12-17 DIAGNOSIS — G932 Benign intracranial hypertension: Secondary | ICD-10-CM | POA: Diagnosis not present

## 2019-12-17 DIAGNOSIS — R519 Headache, unspecified: Secondary | ICD-10-CM | POA: Diagnosis not present

## 2019-12-17 DIAGNOSIS — H35373 Puckering of macula, bilateral: Secondary | ICD-10-CM | POA: Diagnosis not present

## 2019-12-28 DIAGNOSIS — Z7189 Other specified counseling: Secondary | ICD-10-CM | POA: Diagnosis not present

## 2019-12-28 DIAGNOSIS — R21 Rash and other nonspecific skin eruption: Secondary | ICD-10-CM | POA: Diagnosis not present

## 2019-12-28 DIAGNOSIS — L309 Dermatitis, unspecified: Secondary | ICD-10-CM | POA: Diagnosis not present

## 2019-12-31 DIAGNOSIS — E538 Deficiency of other specified B group vitamins: Secondary | ICD-10-CM | POA: Diagnosis not present

## 2019-12-31 DIAGNOSIS — G894 Chronic pain syndrome: Secondary | ICD-10-CM | POA: Diagnosis not present

## 2019-12-31 DIAGNOSIS — F331 Major depressive disorder, recurrent, moderate: Secondary | ICD-10-CM | POA: Diagnosis not present

## 2019-12-31 DIAGNOSIS — M7989 Other specified soft tissue disorders: Secondary | ICD-10-CM | POA: Diagnosis not present

## 2019-12-31 DIAGNOSIS — G43709 Chronic migraine without aura, not intractable, without status migrainosus: Secondary | ICD-10-CM | POA: Diagnosis not present

## 2019-12-31 DIAGNOSIS — G4719 Other hypersomnia: Secondary | ICD-10-CM | POA: Diagnosis not present

## 2019-12-31 DIAGNOSIS — G932 Benign intracranial hypertension: Secondary | ICD-10-CM | POA: Diagnosis not present

## 2019-12-31 DIAGNOSIS — E559 Vitamin D deficiency, unspecified: Secondary | ICD-10-CM | POA: Diagnosis not present

## 2020-01-04 DIAGNOSIS — R519 Headache, unspecified: Secondary | ICD-10-CM | POA: Diagnosis not present

## 2020-01-04 DIAGNOSIS — G8929 Other chronic pain: Secondary | ICD-10-CM | POA: Diagnosis not present

## 2020-01-04 DIAGNOSIS — G932 Benign intracranial hypertension: Secondary | ICD-10-CM | POA: Diagnosis not present

## 2020-01-04 DIAGNOSIS — I10 Essential (primary) hypertension: Secondary | ICD-10-CM | POA: Diagnosis not present

## 2020-01-07 DIAGNOSIS — R600 Localized edema: Secondary | ICD-10-CM | POA: Diagnosis not present

## 2020-01-08 ENCOUNTER — Other Ambulatory Visit: Payer: Self-pay

## 2020-01-09 ENCOUNTER — Ambulatory Visit (INDEPENDENT_AMBULATORY_CARE_PROVIDER_SITE_OTHER): Payer: BC Managed Care – PPO | Admitting: Obstetrics & Gynecology

## 2020-01-09 ENCOUNTER — Encounter: Payer: Self-pay | Admitting: Obstetrics & Gynecology

## 2020-01-09 VITALS — BP 136/88 | Ht 65.0 in | Wt 396.0 lb

## 2020-01-09 DIAGNOSIS — Z30432 Encounter for removal of intrauterine contraceptive device: Secondary | ICD-10-CM

## 2020-01-09 DIAGNOSIS — G932 Benign intracranial hypertension: Secondary | ICD-10-CM

## 2020-01-09 DIAGNOSIS — Z01419 Encounter for gynecological examination (general) (routine) without abnormal findings: Secondary | ICD-10-CM | POA: Diagnosis not present

## 2020-01-09 DIAGNOSIS — Z113 Encounter for screening for infections with a predominantly sexual mode of transmission: Secondary | ICD-10-CM | POA: Diagnosis not present

## 2020-01-09 DIAGNOSIS — F418 Other specified anxiety disorders: Secondary | ICD-10-CM

## 2020-01-09 DIAGNOSIS — Z1151 Encounter for screening for human papillomavirus (HPV): Secondary | ICD-10-CM

## 2020-01-09 NOTE — Progress Notes (Signed)
Anita Hodges [birth year] [id number]   History:    34 y.o.  G0 Boyfriend  RP: Established patient presenting for annual gyn exam   HPI: On Mirena IUD since September 2017.  Recent Dx of IIH.  Will remove Mirena IUD today.  Abstinent.  No abnormal vaginal discharge.  But would like STI screen.  Urine and bowel movements normal. Breasts normal.  Body mass index increased to 65.9.  Declines referral for bariatric surgery, wants to see a nutritionist.  Has not done well with phentermine and Belviq in the past.   Feeling depressed.  Still taking Zoloft 100 mg daily.  No suicidal ideation. Health labs with family physician.   Past medical history,surgical history, family history and social history were all reviewed and documented in the EPIC chart.  Gynecologic History No LMP recorded. (Menstrual status: IUD).  Obstetric History OB History  Gravida Para Term Preterm AB Living  0 0 0 0 0 0  SAB TAB Ectopic Multiple Live Births  0 0 0 0       ROS: A ROS was performed and pertinent positives and negatives are included in the history.  GENERAL: No fevers or chills. HEENT: No change in vision, no earache, sore throat or sinus congestion. NECK: No pain or stiffness. CARDIOVASCULAR: No chest pain or pressure. No palpitations. PULMONARY: No shortness of breath, cough or wheeze. GASTROINTESTINAL: No abdominal pain, nausea, vomiting or diarrhea, melena or bright red blood per rectum. GENITOURINARY: No urinary frequency, urgency, hesitancy or dysuria. MUSCULOSKELETAL: No joint or muscle pain, no back pain, no recent trauma. DERMATOLOGIC: No rash, no itching, no lesions. ENDOCRINE: No polyuria, polydipsia, no heat or cold intolerance. No recent change in weight. HEMATOLOGICAL: No anemia or easy bruising or bleeding. NEUROLOGIC: No headache, seizures, numbness, tingling or weakness. PSYCHIATRIC: No depression, no loss of interest in normal activity or change in sleep pattern.     Exam:   BP  136/88   Ht 5\' 5"  (1.651 m)   Wt (!) 396 lb (179.6 kg)   BMI 65.90 kg/m   Body mass index is 65.9 kg/m.  General appearance : Well developed well nourished female. No acute distress HEENT: Eyes: no retinal hemorrhage or exudates,  Neck supple, trachea midline, no carotid bruits, no thyroidmegaly Lungs: Clear to auscultation, no rhonchi or wheezes, or rib retractions  Heart: Regular rate and rhythm, no murmurs or gallops Breast:Examined in sitting and supine position were symmetrical in appearance, no palpable masses or tenderness,  no skin retraction, no nipple inversion, no nipple discharge, no skin discoloration, no axillary or supraclavicular lymphadenopathy Abdomen: no palpable masses or tenderness, no rebound or guarding Extremities: no edema or skin discoloration or tenderness  Pelvic: Vulva: Normal             Vagina: No gross lesions or discharge  Cervix: No gross lesions or discharge.  Pap/HPV HR, Gono-Chlam done.  Mirena IUD strings visible.  Easy removal by pulling on strings.  IUD complete, intact.  Uterus  AV, normal size, shape and consistency, non-tender and mobile  Adnexa  Without masses or tenderness  Anus: Normal   Assessment/Plan:  34 y.o. female for annual exam   1. Encounter for routine gynecological examination with Papanicolaou smear of cervix Normal gynecologic exam but limited by morbid obesity.  Pap test with high-risk HPV done today.  Breast exam normal.  Health labs with family physician.  2. Morbid obesity ([place]) Declines referral for bariatric surgery.  Patient referred to a  nutritionist.  Strongly encouraged to go on a low calorie/carb diet such as [place] and increase physical activities with aerobic activities 5 times a week and light weightlifting every 2 days.  Patient's physical activities are limited by joint pains for which she is seeing a chiropractor. - TSH - Hemoglobin A1C  3. Encounter for IUD removal Mirena IUD easily removed.   Complete, intact.  Condoms if sexually active.  4. Screen for STD (sexually transmitted disease) Currently abstinent.  Strongly recommended condom use if sexually active. - Gono-Chlam on Pap - HIV antibody (with reflex) - RPR - Hepatitis C Antibody - Hepatitis B Surface AntiGEN  5. Depression with anxiety Continue with Zoloft. - TSH  6. Idiopathic intracranial hypertension Under treatment.  Other orders - DULoxetine (CYMBALTA) 60 MG capsule; Take 60 mg by mouth daily. - atenolol (TENORMIN) 50 MG tablet; Take 50 mg by mouth daily. - topiramate (TOPAMAX) 100 MG tablet; Take 100 mg by mouth 2 (two) times daily. - Galcanezumab-gnlm (EMGALITY) 120 MG/ML SOSY; Inject into the skin.  Princess Bruins MD, 4:26 PM 01/09/2020

## 2020-01-10 ENCOUNTER — Telehealth: Payer: Self-pay | Admitting: *Deleted

## 2020-01-10 DIAGNOSIS — Z113 Encounter for screening for infections with a predominantly sexual mode of transmission: Secondary | ICD-10-CM | POA: Diagnosis not present

## 2020-01-10 DIAGNOSIS — Z1151 Encounter for screening for human papillomavirus (HPV): Secondary | ICD-10-CM | POA: Diagnosis not present

## 2020-01-10 DIAGNOSIS — Z01419 Encounter for gynecological examination (general) (routine) without abnormal findings: Secondary | ICD-10-CM | POA: Diagnosis not present

## 2020-01-10 LAB — HEMOGLOBIN A1C
Hgb A1c MFr Bld: 5.8 % of total Hgb — ABNORMAL HIGH (ref <5.7)
Mean Plasma Glucose: 120 (calc)
eAG (mmol/L): 6.6 (calc)

## 2020-01-10 LAB — TSH: TSH: 2.78 mIU/L

## 2020-01-10 LAB — HEPATITIS C ANTIBODY
Hepatitis C Ab: NONREACTIVE
SIGNAL TO CUT-OFF: 0.01 (ref <1.00)

## 2020-01-10 LAB — RPR: RPR Ser Ql: NONREACTIVE

## 2020-01-10 LAB — HIV ANTIBODY (ROUTINE TESTING W REFLEX): HIV 1&2 Ab, 4th Generation: NONREACTIVE

## 2020-01-10 LAB — HEPATITIS B SURFACE ANTIGEN: Hepatitis B Surface Ag: NONREACTIVE

## 2020-01-10 NOTE — Telephone Encounter (Signed)
-----   Message from Princess Bruins, MD sent at 01/09/2020  4:52 PM EST ----- Regarding: Refer to Nutritionist Worsening Morbid Obesity BMI 65.9.  Weight loss.

## 2020-01-10 NOTE — Telephone Encounter (Signed)
Referral placed at [facility] they will call to schedule. 

## 2020-01-11 LAB — PAP IG, CT-NG NAA, HPV HIGH-RISK
C. trachomatis RNA, TMA: NOT DETECTED
HPV DNA High Risk: NOT DETECTED
N. gonorrhoeae RNA, TMA: NOT DETECTED

## 2020-01-13 ENCOUNTER — Ambulatory Visit
Admission: EM | Admit: 2020-01-13 | Discharge: 2020-01-13 | Disposition: A | Discharge status: Home / Self Care | Payer: BC Managed Care – PPO | Attending: Emergency Medicine | Admitting: Emergency Medicine

## 2020-01-13 ENCOUNTER — Ambulatory Visit (INDEPENDENT_AMBULATORY_CARE_PROVIDER_SITE_OTHER): Payer: BC Managed Care – PPO

## 2020-01-13 ENCOUNTER — Other Ambulatory Visit: Payer: Self-pay

## 2020-01-13 DIAGNOSIS — J984 Other disorders of lung: Secondary | ICD-10-CM

## 2020-01-13 DIAGNOSIS — R05 Cough: Secondary | ICD-10-CM

## 2020-01-13 DIAGNOSIS — R0602 Shortness of breath: Secondary | ICD-10-CM

## 2020-01-13 DIAGNOSIS — R053 Chronic cough: Secondary | ICD-10-CM

## 2020-01-13 MED ORDER — ALBUTEROL SULFATE (2.5 MG/3ML) 0.083% IN NEBU: 2.5000 mg | INHALATION_SOLUTION | Freq: Four times a day (QID) | RESPIRATORY_TRACT | 2 refills | Status: DC | PRN

## 2020-01-13 MED ORDER — PREDNISONE 10 MG (21) PO TBPK: ORAL_TABLET | Freq: Every day | ORAL | 0 refills | Status: DC

## 2020-01-13 MED ORDER — DEXAMETHASONE SODIUM PHOSPHATE 10 MG/ML IJ SOLN
10.0000 mg | Freq: Once | INTRAMUSCULAR | Status: AC
  Administered 2020-01-13: 10 mg via INTRAMUSCULAR

## 2020-01-13 NOTE — ED Provider Notes (Signed)
[place]   DT:[id number] 01/13/20 Arrival Time: 1017  Cc: COUGH  SUBJECTIVE:  Anita Hodges is a 34 y.o. female who presents with persistent intermittent cough that is both dry and productive with yellow sputum, SOB, and wheezing x couple of months.  Denies sick exposure to COVID, flu or strep.  Denies hx of asthma or COPD.  Denies tobacco use, but father was a tobacco user.  Was seen for this about a month ago at [facility] and treated with steroid and z-pak with temporary relief.  Symptoms are made worse with laying flat, and activity.  Denies fever, chills, fatigue, sinus pain, rhinorrhea, sore throat, chest pain, nausea, changes in bowel or bladder habits, paroxysmal nocturnal dyspnea, orthopnea.    ROS: As per HPI.  All other pertinent ROS negative.     Past Medical History:  Diagnosis Date  . Allergy   . Anxiety   . Hypertension   . Obesity    Past Surgical History:  Procedure Laterality Date  . CHOLECYSTECTOMY N/A 12/27/2018   Procedure: LAPAROSCOPIC CHOLECYSTECTOMY;  Surgeon: Aviva Signs, MD;  Location: [facility] OR[facility];  Service: General;  Laterality: N/A;  pt knows to arrive at 6:15  . COLONOSCOPY WITH PROPOFOL N/A 07/23/2019   Procedure: COLONOSCOPY WITH PROPOFOL;  Surgeon: Daneil Dolin, MD;  Location: [facility];  Service: Endoscopy;  Laterality: N/A;  1:15pm  . ESOPHAGOGASTRODUODENOSCOPY (EGD) WITH PROPOFOL N/A 12/18/2018   mild erosive reflux esophagitis, normal stomach, normal duodenum.   Marland Kitchen POLYPECTOMY  07/23/2019   Procedure: POLYPECTOMY;  Surgeon: Daneil Dolin, MD;  Location: [facility];  Service: Endoscopy;;  . TONSILLECTOMY    . WISDOM TOOTH EXTRACTION     Allergies  Allergen Reactions  . Codeine Other (See Comments)    DIZZINESS AND COLD (SICK)   No current facility-administered medications on file prior to encounter.   Current Outpatient Medications on File Prior to Encounter  Medication Sig Dispense Refill  . albuterol (VENTOLIN HFA) 108 (90 Base)  MCG/ACT inhaler Inhale 1-2 puffs into the lungs every 6 (six) hours as needed for wheezing or shortness of breath. 18 g 0  . ALPRAZolam (XANAX) 0.25 MG tablet Take 1 tablet (0.25 mg total) by mouth daily as needed for anxiety. 30 tablet 0  . amLODipine (NORVASC) 10 MG tablet Take 10 mg by mouth daily.    Marland Kitchen atenolol (TENORMIN) 50 MG tablet Take 50 mg by mouth daily.    . benzonatate (TESSALON) 100 MG capsule Take 1 capsule (100 mg total) by mouth every 8 (eight) hours. 21 capsule 0  . budesonide-formoterol (SYMBICORT) 160-4.5 MCG/ACT inhaler Inhale 2 puffs into the lungs 2 (two) times daily.    . DULoxetine (CYMBALTA) 60 MG capsule Take 60 mg by mouth daily.    . fluticasone (FLONASE) 50 MCG/ACT nasal spray Place 1 spray into both nostrils daily for 14 days. 16 g 0  . Galcanezumab-gnlm (EMGALITY) 120 MG/ML SOSY Inject into the skin.    . hyoscyamine (LEVSIN SL) 0.125 MG SL tablet Place 1 tablet (0.125 mg total) under the tongue every 4 (four) hours as needed. For abdominal cramping. Do not take with Bentyl. 60 tablet 5  . levocetirizine (XYZAL) 5 MG tablet Take 5 mg by mouth every morning.     . montelukast (SINGULAIR) 10 MG tablet Take 10 mg by mouth at bedtime.    . ondansetron (ZOFRAN) 4 MG tablet Take 1 tablet (4 mg total) by mouth every 8 (eight) hours as needed  for nausea or vomiting. 60 tablet 3  . polyethylene glycol-electrolytes (TRILYTE) 420 g solution Take 4,000 mLs by mouth as directed. 4000 mL 0  . promethazine (PHENERGAN) 25 MG tablet Take 0.5 tablets (12.5 mg total) by mouth every 6 (six) hours as needed for nausea or vomiting. 30 tablet 0  . topiramate (TOPAMAX) 100 MG tablet Take 100 mg by mouth 2 (two) times daily.      Social History   Socioeconomic History  . Marital status: Single    Spouse name: Not on file  . Number of children: Not on file  . Years of education: Not on file  . Highest education level: Not on file  Occupational History  . Not on file  Tobacco Use  .  Smoking status: Never Smoker  . Smokeless tobacco: Never Used  Substance and Sexual Activity  . Alcohol use: No    Alcohol/week: 0.0 standard drinks  . Drug use: No  . Sexual activity: Yes    Partners: Male    Birth control/protection: I.U.D.    Comment: 1st intercourse- 42, partners- 57, current partner- 4 months   Other Topics Concern  . Not on file  Social History Narrative   No children.      Social Determinants of Health   Financial Resource Strain:   . Difficulty of Paying Living Expenses: Not on file  Food Insecurity:   . Worried About Charity fundraiser in the Last Year: Not on file  . Ran Out of Food in the Last Year: Not on file  Transportation Needs:   . Lack of Transportation (Medical): Not on file  . Lack of Transportation (Non-Medical): Not on file  Physical Activity:   . Days of Exercise per Week: Not on file  . Minutes of Exercise per Session: Not on file  Stress:   . Feeling of Stress : Not on file  Social Connections:   . Frequency of Communication with Friends and Family: Not on file  . Frequency of Social Gatherings with Friends and Family: Not on file  . Attends Religious Services: Not on file  . Active Member of Clubs or Organizations: Not on file  . Attends Archivist Meetings: Not on file  . Marital Status: Not on file  Intimate Partner Violence:   . Fear of Current or Ex-Partner: Not on file  . Emotionally Abused: Not on file  . Physically Abused: Not on file  . Sexually Abused: Not on file   Family History  Problem Relation Age of Onset  . Hypertension Mother   . Colon polyps Mother        unknown age of onset  . Gallbladder disease Mother   . Diabetes Father   . Gallbladder disease Father   . Cancer Maternal Grandfather        ?  Marland Kitchen Cancer Paternal Grandmother        ?  . Colon cancer Neg Hx      OBJECTIVE:  Vitals:   01/13/20 1028  BP: 138/85  Pulse: 80  Resp: 20  Temp: (!) 97.2 F (36.2 C)  SpO2: 94%      General appearance: Alert, appears mildly fatigued, but nontoxic; speaking in full sentences without difficulty HEENT:NCAT; Ears: EACs clear, TMs pearly gray; Eyes: PERRL.  EOM grossly intact. Nose: nares patent without rhinorrhea; Throat: tonsils nonerythematous or enlarged, uvula midline  Neck: supple without LAD Lungs: diffuse wheezes heard through bilateral lung fields of anterior and posterior chest; normal  respiratory effort; mild cough present Heart: regular rate and rhythm.   Skin: warm and dry Psychological: alert and cooperative; normal mood and affect  DIAGNOSTIC STUDIES:  DG Chest 2 View  Result Date: 01/13/2020 CLINICAL DATA:  Cough for several months EXAM: CHEST - 2 VIEW COMPARISON:  Chest radiograph 01/18/2018 FINDINGS: The heart size and mediastinal contours are within normal limits. There is diffuse mild peribronchial thickening bilaterally. No focal consolidation. No pneumothorax or significant pleural effusion. No acute finding in the visualized skeleton. IMPRESSION: Mild peribronchial thickening bilaterally as can be seen with small airways disease (asthma, bronchitis). Electronically Signed   By: Audie Pinto M.D.   On: 01/13/2020 10:46    X-rays positive peribronchial thickening.    I have reviewed the x-rays myself and the radiologist interpretation. I am in agreement with the radiologist interpretation.     ASSESSMENT & PLAN:  1. Chronic cough   2. SOB (shortness of breath)   3. Small airways disease     Meds ordered this encounter  Medications  . dexamethasone (DECADRON) injection 10 mg    Orders Placed This Encounter  Procedures  . DG Chest 2 View    Standing Status:   Standing    Number of Occurrences:   1    Order Specific Question:   Reason for Exam (SYMPTOM  OR DIAGNOSIS REQUIRED)    Answer:   cough and sob    X-rays concerning for small airways disease (bronchitis/ asthma) Steroid shot given in office Get plenty of rest and push  fluids Prednisone prescribed.   Albuterol solution prescribed.  Will use fathers nebulizer at home Follow up with PCP for recheck and to ensure symptoms are improving Return or go to ER if you have any new or worsening symptoms such as fever, chills, fatigue, shortness of breath, wheezing, chest pain, nausea, changes in bowel or bladder habits, etc...  Reviewed expectations re: course of current medical issues. Questions answered. Outlined signs and symptoms indicating need for more acute intervention. Patient verbalized understanding. After Visit Summary given.          Lestine Box, PA-C 01/13/20 1104

## 2020-01-13 NOTE — Discharge Instructions (Signed)
X-rays concerning for small airways disease (bronchitis/ asthma) Steroid shot given in office Get plenty of rest and push fluids Prednisone prescribed.   Albuterol solution prescribed.  Will use fathers nebulizer at home Follow up with PCP for recheck and to ensure symptoms are improving Return or go to ER if you have any new or worsening symptoms such as fever, chills, fatigue, shortness of breath, wheezing, chest pain, nausea, changes in bowel or bladder habits, etc..Marland Kitchen

## 2020-01-13 NOTE — ED Triage Notes (Signed)
Pt has had recurrent cough for past couple of months that improved briefly then returned  . Pt has audible wheezing

## 2020-01-15 ENCOUNTER — Encounter: Payer: Self-pay | Admitting: Obstetrics & Gynecology

## 2020-01-15 NOTE — Patient Instructions (Signed)
1. Encounter for routine gynecological examination with Papanicolaou smear of cervix Normal gynecologic exam but limited by morbid obesity.  Pap test with high-risk HPV done today.  Breast exam normal.  Health labs with family physician.  2. Morbid obesity ([place]) Declines referral for bariatric surgery.  Patient referred to a nutritionist.  Strongly encouraged to go on a low calorie/carb diet such as [place] and increase physical activities with aerobic activities 5 times a week and light weightlifting every 2 days.  Patient's physical activities are limited by joint pains for which she is seeing a chiropractor. - TSH - Hemoglobin A1C  3. Encounter for IUD removal Mirena IUD easily removed.  Complete, intact.  Condoms if sexually active.  4. Screen for STD (sexually transmitted disease) Currently abstinent.  Strongly recommended condom use if sexually active. - Gono-Chlam on Pap - HIV antibody (with reflex) - RPR - Hepatitis C Antibody - Hepatitis B Surface AntiGEN  5. Depression with anxiety Continue with Zoloft. - TSH  6. Idiopathic intracranial hypertension Under treatment.  Other orders - DULoxetine (CYMBALTA) 60 MG capsule; Take 60 mg by mouth daily. - atenolol (TENORMIN) 50 MG tablet; Take 50 mg by mouth daily. - topiramate (TOPAMAX) 100 MG tablet; Take 100 mg by mouth 2 (two) times daily. - Galcanezumab-gnlm (EMGALITY) 120 MG/ML SOSY; Inject into the skin.  Anita Hodges, it was a pleasure seeing you today!  I will inform you of your results as soon as they are available.

## 2020-01-16 DIAGNOSIS — H1045 Other chronic allergic conjunctivitis: Secondary | ICD-10-CM | POA: Diagnosis not present

## 2020-01-16 DIAGNOSIS — Z4802 Encounter for removal of sutures: Secondary | ICD-10-CM | POA: Diagnosis not present

## 2020-01-16 DIAGNOSIS — J3081 Allergic rhinitis due to animal (cat) (dog) hair and dander: Secondary | ICD-10-CM | POA: Diagnosis not present

## 2020-01-16 DIAGNOSIS — J3089 Other allergic rhinitis: Secondary | ICD-10-CM | POA: Diagnosis not present

## 2020-01-16 DIAGNOSIS — J301 Allergic rhinitis due to pollen: Secondary | ICD-10-CM | POA: Diagnosis not present

## 2020-01-16 DIAGNOSIS — R21 Rash and other nonspecific skin eruption: Secondary | ICD-10-CM | POA: Diagnosis not present

## 2020-01-17 NOTE — Telephone Encounter (Signed)
Patient scheduled on 02/26/20 @ 8am

## 2020-01-21 ENCOUNTER — Ambulatory Visit (HOSPITAL_COMMUNITY): Payer: Self-pay | Admitting: Psychiatry

## 2020-01-28 ENCOUNTER — Ambulatory Visit (HOSPITAL_COMMUNITY): Payer: BC Managed Care – PPO | Admitting: Psychiatry

## 2020-01-28 ENCOUNTER — Other Ambulatory Visit: Payer: Self-pay

## 2020-01-28 DIAGNOSIS — G932 Benign intracranial hypertension: Secondary | ICD-10-CM | POA: Diagnosis not present

## 2020-01-28 DIAGNOSIS — I10 Essential (primary) hypertension: Secondary | ICD-10-CM | POA: Diagnosis not present

## 2020-01-28 DIAGNOSIS — F332 Major depressive disorder, recurrent severe without psychotic features: Secondary | ICD-10-CM | POA: Diagnosis not present

## 2020-01-29 ENCOUNTER — Institutional Professional Consult (permissible substitution): Payer: BC Managed Care – PPO | Admitting: Internal Medicine

## 2020-02-01 ENCOUNTER — Institutional Professional Consult (permissible substitution): Payer: BC Managed Care – PPO | Admitting: Internal Medicine

## 2020-02-04 DIAGNOSIS — G932 Benign intracranial hypertension: Secondary | ICD-10-CM | POA: Diagnosis not present

## 2020-02-05 DIAGNOSIS — Z8669 Personal history of other diseases of the nervous system and sense organs: Secondary | ICD-10-CM | POA: Diagnosis not present

## 2020-02-05 DIAGNOSIS — H35373 Puckering of macula, bilateral: Secondary | ICD-10-CM | POA: Diagnosis not present

## 2020-02-05 NOTE — Progress Notes (Signed)
Virtual Visit via Video Note  I connected with [name] on 02/07/20 at  3:00 PM EDT by a video enabled telemedicine application and verified that I am speaking with the correct person using two identifiers.   I discussed the limitations of evaluation and management by telemedicine and the availability of in person appointments. The patient expressed understanding and agreed to proceed.    I discussed the assessment and treatment plan with the patient. The patient was provided an opportunity to ask questions and all were answered. The patient agreed with the plan and demonstrated an understanding of the instructions.   The patient was advised to call back or seek an in-person evaluation if the symptoms worsen or if the condition fails to improve as anticipated.  I provided 40 minutes of non-face-to-face time during this encounter.   Norman Clay, MD     Psychiatric Initial Adult Assessment   Patient Identification: Anita Hodges MRN:  [id number] Date of Evaluation:  02/07/2020 Referral Source: Dr. Caren Macadam Chief Complaint:   Chief Complaint    Depression; Anxiety; Psychiatric Evaluation    "It turned my world upside down" Visit Diagnosis:    ICD-10-CM   1. MDD (major depressive disorder), recurrent episode, moderate (HCC)  F33.1     History of Present Illness:   Anita Hodges is a 34 y.o. year old female with a history of anxiety, idiopathic intracranial hypertension, hypertension, obesity, who is referred for anxiety.   (She lies in the bed in a dark room during the entire encounter due to headache) She states that she was diagnosed with intracranial hypertension last year.  She has constant headache, and has hard time managing her symptoms. Any sound or lights bother her. It has been also difficult for her to manage her feelings. She cries all the time.  She received a call today that she lost her work. She was no leave since the end of last October. She started to  have a headache whenever she was in front of the computer. She also states that she was very stressed at work all the time, stating that she constantly had to answer calls. She believes she has "underlying issues to work on," stating that she was not happy about her self/self image since childhood. Although she contacts her mother regularly, she reports her frustration that her mother does not understand her condition ("she does not wan to hear.")  She has insomnia, which she attributes to headache.  Although she used to enjoy shopping and going out with her friends, she does not do it anymore as she lost interest.  She has fair appetite.  She has difficulty in concentration.  She denies SI.  She feels anxious and tense.  She has occasional panic attacks. She denies alcohol, drug use.   Routine- she lies in the bed most of the time  Medication- duloxetine 60 mg daily at least for a few months  Associated Signs/Symptoms: Depression Symptoms:  depressed mood, anhedonia, insomnia, fatigue, difficulty concentrating, anxiety, (Hypo) Manic Symptoms:  denies decreased need for sleep, euphoria Anxiety Symptoms:  Excessive Worry, Panic Symptoms, Psychotic Symptoms:  denies AH, VH PTSD Symptoms: Negative  Past Psychiatric History:  Outpatient: saw a therapist at [facility] a couple of years ago for depression Psychiatry admission: denies  Previous suicide attempt: denies Past trials of medication: sertraline, duloxetine History of violence: denies   Previous Psychotropic Medications: Yes   Substance Abuse History in the last 12 months:  No.  Consequences  of Substance Abuse: NA  Past Medical History:  Past Medical History:  Diagnosis Date  . Allergy   . Anxiety   . Hypertension   . Obesity     Past Surgical History:  Procedure Laterality Date  . CHOLECYSTECTOMY N/A 12/27/2018   Procedure: LAPAROSCOPIC CHOLECYSTECTOMY;  Surgeon: Aviva Signs, MD;  Location: [facility];  Service: General;   Laterality: N/A;  pt knows to arrive at 6:15  . COLONOSCOPY WITH PROPOFOL N/A 07/23/2019   Procedure: COLONOSCOPY WITH PROPOFOL;  Surgeon: Daneil Dolin, MD;  [facility];  Service: Endoscopy;  Laterality: N/A;  1:15pm  . ESOPHAGOGASTRODUODENOSCOPY (EGD) WITH PROPOFOL N/A 12/18/2018   mild erosive reflux esophagitis, normal stomach, normal duodenum.   Marland Kitchen POLYPECTOMY  07/23/2019   Procedure: POLYPECTOMY;  Surgeon: Daneil Dolin, MD;  [facility];  Service: Endoscopy;;  . TONSILLECTOMY    . WISDOM TOOTH EXTRACTION      Family Psychiatric History:  As below, denies  Family History:  Family History  Problem Relation Age of Onset  . Hypertension Mother   . Colon polyps Mother        unknown age of onset  . Gallbladder disease Mother   . Diabetes Father   . Gallbladder disease Father   . Cancer Maternal Grandfather        ?  Marland Kitchen Cancer Paternal Grandmother        ?  . Colon cancer Neg Hx     Social History:   Social History   Socioeconomic History  . Marital status: Single    Spouse name: Not on file  . Number of children: Not on file  . Years of education: Not on file  . Highest education level: Not on file  Occupational History  . Not on file  Tobacco Use  . Smoking status: Never Smoker  . Smokeless tobacco: Never Used  Substance and Sexual Activity  . Alcohol use: No    Alcohol/week: 0.0 standard drinks  . Drug use: No  . Sexual activity: Yes    Partners: Male    Birth control/protection: I.U.D.    Comment: 1st intercourse- 70, partners- 19, current partner- 4 months   Other Topics Concern  . Not on file  Social History Narrative   No children.      Social Determinants of Health   Financial Resource Strain:   . Difficulty of Paying Living Expenses:   Food Insecurity:   . Worried About Charity fundraiser in the Last Year:   . Arboriculturist in the Last Year:   Transportation Needs:   . Film/video editor (Medical):   Marland Kitchen Lack of  Transportation (Non-Medical):   Physical Activity:   . Days of Exercise per Week:   . Minutes of Exercise per Session:   Stress:   . Feeling of Stress :   Social Connections:   . Frequency of Communication with Friends and Family:   . Frequency of Social Gatherings with Friends and Family:   . Attends Religious Services:   . Active Member of Clubs or Organizations:   . Attends Archivist Meetings:   Marland Kitchen Marital Status:     Additional Social History:  Lives by herself, no children She grew up in [place], "good" childhood. 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 Codeine Other (See Comments)    DIZZINESS AND COLD (SICK)    Metabolic Disorder Labs: Lab Results  Component Value Date   HGBA1C 5.8 (H) 01/09/2020   MPG 120 01/09/2020   MPG 114 01/02/2013   Lab Results  Component Value Date   PROLACTIN 14.0 02/06/2015   Lab Results  Component Value Date   CHOL 145 05/26/2016   TRIG 77 05/26/2016   HDL 35 (L) 05/26/2016   CHOLHDL 4.1 05/26/2016   VLDL 15 05/26/2016   LDLCALC 95 05/26/2016   LDLCALC 89 01/20/2015   Lab Results  Component Value Date   TSH 2.78 01/09/2020    Therapeutic Level Labs: No results found for: LITHIUM No results found for: CBMZ No results found for: VALPROATE  Current Medications: Current Outpatient Medications  Medication Sig Dispense Refill  . acetaZOLAMIDE (DIAMOX) 125 MG tablet Take 125 mg by mouth daily.    Marland Kitchen albuterol (PROVENTIL) (2.5 MG/3ML) 0.083% nebulizer solution Take 3 mLs (2.5 mg total) by nebulization every 6 (six) hours as needed for wheezing or shortness of breath. 75 mL 2  . albuterol (VENTOLIN HFA) 108 (90 Base) MCG/ACT inhaler Inhale 1-2 puffs into the lungs every 6 (six) hours as needed for wheezing or shortness of breath. 18 g 0  . ALPRAZolam  (XANAX) 0.25 MG tablet Take 1 tablet (0.25 mg total) by mouth daily as needed for anxiety. (Patient not taking: Reported on 02/07/2020) 30 tablet 0  . amLODipine (NORVASC) 10 MG tablet Take 10 mg by mouth daily.    Marland Kitchen atenolol (TENORMIN) 50 MG tablet Take 50 mg by mouth daily.    . benzonatate (TESSALON) 100 MG capsule Take 1 capsule (100 mg total) by mouth every 8 (eight) hours. 21 capsule 0  . budesonide-formoterol (SYMBICORT) 160-4.5 MCG/ACT inhaler Inhale 2 puffs into the lungs 2 (two) times daily.    . DULoxetine (CYMBALTA) 30 MG capsule 30 mg daily for one week, then discontinue 7 capsule 0  . DULoxetine (CYMBALTA) 60 MG capsule Take 60 mg by mouth daily.    Marland Kitchen escitalopram (LEXAPRO) 10 MG tablet 5 mg daily for one week, then 10 mg daily 30 tablet 1  . fluticasone (FLONASE) 50 MCG/ACT nasal spray Place 1 spray into both nostrils daily for 14 days. 16 g 0  . Galcanezumab-gnlm (EMGALITY) 120 MG/ML SOSY Inject into the skin.    . hyoscyamine (LEVSIN SL) 0.125 MG SL tablet Place 1 tablet (0.125 mg total) under the tongue every 4 (four) hours as needed. For abdominal cramping. Do not take with Bentyl. 60 tablet 5  . levocetirizine (XYZAL) 5 MG tablet Take 5 mg by mouth every morning.     . montelukast (SINGULAIR) 10 MG tablet Take 10 mg by mouth at bedtime.    . ondansetron (ZOFRAN) 4 MG tablet Take 1 tablet (4 mg total) by mouth every 8 (eight) hours as needed for nausea or vomiting. 60 tablet 3  . polyethylene glycol-electrolytes (TRILYTE) 420 g solution Take 4,000 mLs by mouth as directed. 4000 mL 0  . predniSONE (STERAPRED UNI-PAK 21 TAB) 10 MG (21) TBPK tablet Take by mouth daily. Take 6 tabs by mouth daily  for 2 days, then 5 tabs for 2 days, then 4 tabs for 2 days, then 3 tabs for 2 days, 2 tabs for 2 days, then 1 tab by mouth daily for 2 days 42 tablet 0  . promethazine (PHENERGAN) 25 MG tablet Take 0.5 tablets (12.5 mg total) by mouth  every 6 (six) hours as needed for nausea or vomiting. 30  tablet 0  . topiramate (TOPAMAX) 100 MG tablet Take 100 mg by mouth 2 (two) times daily.     No current facility-administered medications for this visit.    Musculoskeletal: Strength & Muscle Tone: n/A Gait & Station: N/A Patient leans: N/A  Psychiatric Specialty Exam: Review of Systems  Psychiatric/Behavioral: Positive for decreased concentration, dysphoric mood and sleep disturbance. Negative for agitation, behavioral problems, confusion, hallucinations, self-injury and suicidal ideas. The patient is nervous/anxious. The patient is not hyperactive.   All other systems reviewed and are negative.   There were no vitals taken for this visit.There is no height or weight on file to calculate BMI.  General Appearance: Fairly Groomed  Eye Contact:  Good  Speech:  Clear and Coherent  Volume:  Normal  Mood:  Anxious and Depressed  Affect:  Appropriate, Congruent, Restricted and Tearful  Thought Process:  Coherent  Orientation:  Full (Time, Place, and Person)  Thought Content:  Logical  Suicidal Thoughts:  No  Homicidal Thoughts:  No  Memory:  Immediate;   Good  Judgement:  Good  Insight:  Good  Psychomotor Activity:  Normal  Concentration:  Concentration: Good and Attention Span: Good  Recall:  Good  Fund of Knowledge:Good  Language: Good  Akathisia:  No  Handed:  Right  AIMS (if indicated):  not done  Assets:  Communication Skills Desire for Improvement  ADL's:  Intact  Cognition: WNL  Sleep:  Poor   Screenings: PHQ2-9     Office Visit from 12/29/2017 in Primary Care at [facility] from 04/04/2014 in [facility] Care at [facility]  PHQ-2 Total Score  0  0      Assessment and Plan:  Anita Hodges is a 34 y.o. year old female with a history of anxiety, idiopathic intracranial hypertension, hypertension, obesity, who is referred for anxiety.   # MDD, moderate, recurrent without psychotic features She reports worsening in depressive symptoms in the context of  demoralization from intracranial hypertension, and ending appointment as of today.  Will switch from duloxetine to Lexapro to target depression given she reports limited benefit from duloxetine.  Discussed possible risk of withdrawal symptoms and risk of GI side effect.  She will greatly benefit from supportive therapy/CBT; will make referral.   Plan 1. Start lexapro 5 mg daily for one week, then 10 mg daily  2. Decrease duloxetine 30 mg daily for one week, then discontinue  3. Referral to therapy  4. Next appointment: 5/13 at 3 PM for 30 mins, video - on Topamax  The patient demonstrates the following risk factors for suicide: Chronic risk factors for suicide include: psychiatric disorder of depression. Acute risk factors for suicide include: unemployment and loss (financial, interpersonal, professional). Protective factors for this patient include: positive social support. Considering these factors, the overall suicide risk at this point appears to be low. Patient is appropriate for outpatient follow up.   Norman Clay, MD 4/1/20213:47 PM

## 2020-02-07 ENCOUNTER — Other Ambulatory Visit: Payer: Self-pay

## 2020-02-07 ENCOUNTER — Encounter (HOSPITAL_COMMUNITY): Payer: Self-pay | Admitting: Psychiatry

## 2020-02-07 ENCOUNTER — Ambulatory Visit (INDEPENDENT_AMBULATORY_CARE_PROVIDER_SITE_OTHER): Payer: BC Managed Care – PPO | Admitting: Psychiatry

## 2020-02-07 DIAGNOSIS — F331 Major depressive disorder, recurrent, moderate: Secondary | ICD-10-CM | POA: Diagnosis not present

## 2020-02-07 MED ORDER — ESCITALOPRAM OXALATE 10 MG PO TABS: ORAL_TABLET | ORAL | 1 refills | Status: DC

## 2020-02-07 MED ORDER — DULOXETINE HCL 30 MG PO CPEP: ORAL_CAPSULE | ORAL | 0 refills | Status: DC

## 2020-02-07 NOTE — Patient Instructions (Signed)
1. Start lexapro 5 mg daily for one week, then 10 mg daily  2. Decrease duloxetine 30 mg daily for one week, then discontinue  3. Referral to therapy  4. Next appointment: 5/13 at 3 PM

## 2020-02-19 ENCOUNTER — Encounter: Payer: Self-pay | Admitting: Internal Medicine

## 2020-02-19 ENCOUNTER — Other Ambulatory Visit: Payer: Self-pay

## 2020-02-19 ENCOUNTER — Ambulatory Visit: Payer: BC Managed Care – PPO | Admitting: Internal Medicine

## 2020-02-19 VITALS — BP 100/60 | HR 85 | Temp 97.3°F | Ht 65.0 in | Wt >= 6400 oz

## 2020-02-19 DIAGNOSIS — R0602 Shortness of breath: Secondary | ICD-10-CM

## 2020-02-19 MED ORDER — FLUTICASONE-SALMETEROL 250-50 MCG/DOSE IN AEPB: 1.0000 | INHALATION_SPRAY | Freq: Two times a day (BID) | RESPIRATORY_TRACT | 5 refills | Status: DC

## 2020-02-19 NOTE — Progress Notes (Signed)
Anita Hodges    [id number]    [birth year]  Primary Care Physician:Hagler, Apolonio Schneiders, MD  Referring Physician: Caren Macadam, [place],  [place] [address] Reason for Consultation: "coufg and bronchospasm with repeated steroid doses/inhaler use, evaluate for asthma" Date of Consultation: 02/19/2020  Chief complaint:   Chief Complaint  Patient presents with  . Consult    Cough and bronchospasm for months.  Clear phlem.  Wheezing all the time.     HPI: Anita Hodges is a 34 y.o. woman who presents for new patient evaluation of cough and shortness of breath. Has history of idiopathic intracranial pressure.   She is short of breath "all the time" whether it's a rest or exertion.  Worse with stress and anxiety She is prescribed albuterol inhaler which she takes 4 times a day with some relief.  Been to urgent care twice in the last year for her breathing and was given prednisone which did help a lot.  Was prescribed symbicort from urgent care but never took it.  No recurrent childhood respiratory infections  Current Regimen: albuterol Asthma Triggers: stress anxiety exertion Exacerbations in the last year: 2 requiring prednisone History of hospitalization or intubation: never Hives: none. Has red rash which is not pruritic.  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 Exposures: lives at home alone, pet dog.  Smoking history: never smoker, father smoked in home as a child.   Social History   Occupational History  . Not on file  Tobacco Use  . Smoking status: Never Smoker  . Smokeless tobacco: Never Used  Substance and Sexual Activity  . Alcohol use: No    Alcohol/week: 0.0 standard drinks  . Drug use: No  . Sexual activity: Yes    Partners:  Male    Birth control/protection: I.U.D.    Comment: 1st intercourse- 74, partners- 73, current partner- 4 months     Relevant family history:  Family History  Problem Relation Age of Onset  . Hypertension Mother   . Colon polyps Mother        unknown age of onset  . Gallbladder disease Mother   . Diabetes Father   . Gallbladder disease Father   . COPD Father   . Cancer Maternal Grandfather        ?  Marland Kitchen Cancer Paternal Grandmother        ?  . Colon cancer Neg Hx     Past Medical History:  Diagnosis Date  . Allergy   . Anxiety   . Hypertension   . Obesity     Past Surgical History:  Procedure Laterality Date  . CHOLECYSTECTOMY N/A 12/27/2018   Procedure: LAPAROSCOPIC CHOLECYSTECTOMY;  Surgeon: Aviva Signs, MD;  Location: [facility];  Service: General;  Laterality: N/A;  pt knows to arrive at 6:15  . COLONOSCOPY WITH PROPOFOL N/A 07/23/2019   Procedure: COLONOSCOPY WITH PROPOFOL;  Surgeon: Daneil Dolin, MD;  Location: [facility];  Service: Endoscopy;  Laterality: N/A;  1:15pm  . ESOPHAGOGASTRODUODENOSCOPY (EGD) WITH PROPOFOL N/A 12/18/2018   mild erosive reflux esophagitis, normal stomach, normal duodenum.   Marland Kitchen POLYPECTOMY  07/23/2019   Procedure: POLYPECTOMY;  Surgeon: Daneil Dolin, MD;  Location: [facility];  Service: Endoscopy;;  . TONSILLECTOMY    .  WISDOM TOOTH EXTRACTION       Review of systems: Review of Systems  Constitutional: Negative for chills, fever and weight loss.  HENT: Positive for congestion. Negative for sinus pain and sore throat.   Eyes: Negative for discharge and redness.  Respiratory: Positive for cough, shortness of breath and wheezing. Negative for hemoptysis and sputum production.   Cardiovascular: Negative for chest pain, palpitations and leg swelling.  Gastrointestinal: Negative for heartburn, nausea and vomiting.  Musculoskeletal: Positive for back pain, joint pain, myalgias and neck pain.  Skin: Negative for rash.  Neurological:  Negative for dizziness, tremors, focal weakness and headaches.  Endo/Heme/Allergies: Positive for environmental allergies.  Psychiatric/Behavioral: Positive for depression. The patient is not nervous/anxious.   All other systems reviewed and are negative.   Physical Exam: Blood pressure 100/60, pulse 85, temperature (!) 97.3 F (36.3 C), temperature source Temporal, height 5\' 5"  (1.651 m), weight (!) 404 lb 12.8 oz (183.6 kg), SpO2 99 %. Gen:      No acute distress, obese ENT:  no nasal polyps, mucus membranes moist, mallampati IV. Some wheezing ausculated on expiration over the neck Lungs:    Diminished No increased respiratory effort, symmetric chest wall excursion, clear to auscultation bilaterally, no wheezes or crackles CV:         Regular rate and rhythm; no murmurs, rubs, or gallops.  No pedal edema Abd:      + bowel sounds; soft, non-tender; no distension MSK: no acute synovitis of DIP or PIP joints, no mechanics hands.  Skin:      Warm and dry; no rashes Neuro: normal speech, no focal facial asymmetry Psych: alert and oriented x3, normal mood and affect   Data Reviewed/Medical Decision Making:  Independent interpretation of tests: Imaging: . Review of patient's chest xray 01/2020 images revealed no acute cardiopulmonary process. The patient's images have been independently reviewed by me.    PFTs:  Labs:  Lab Results  Component Value Date   WBC 9.6 07/03/2019   HGB 13.3 07/03/2019   HCT 42.4 07/03/2019   MCV 83.6 07/03/2019   PLT 358 07/03/2019   Lab Results  Component Value Date   NA 137 07/03/2019   K 4.0 07/03/2019   CL 104 07/03/2019   CO2 26 07/03/2019     Immunization status:  Immunization History  Administered Date(s) Administered  . Influenza, Quadrivalent, Recombinant, Inj, Pf 11/21/2019  . Influenza,inj,Quad PF,6+ Mos 07/27/2018   3% eosinophils in 2017  . I reviewed prior external note(s) from [facility] . I reviewed the result(s) of  the labs and imaging as noted above.  . I have ordered spirometry and FeNO   Assessment:  Moderate Persistent Asthma Allergic Rhinitis Obesity   Plan/Recommendations: Will obtain Spirometry and Feno  Start advair BID.  Continue albuterol as needed.  Continue rhinitis therapy with fluticasone intranasal, montelukast and levocetirizine.   We discussed disease management and progression at length today.    Return to Care: Return in about 2 months (around 04/20/2020).  Lenice Llamas, MD Pulmonary and [place]  CC: Caren Macadam, MD

## 2020-02-19 NOTE — Patient Instructions (Addendum)
The patient should have follow up scheduled with myself in 2 months.   Prior to next visit patient should have: Spirometry and FeNO  Take the albuterol rescue inhaler every 4 to 6 hours as needed for wheezing or shortness of breath. You can also take it 15 minutes before exercise or exertional activity. Side effects include heart racing or pounding, jitters or anxiety. If you have a history of an irregular heart rhythm, it can make this worse. Can also give some patients a hard time sleeping.  To inhale the aerosol using an inhaler, follow these steps:  1. Remove the protective dust cap from the end of the mouthpiece. If the dust cap was not placed on the mouthpiece, check the mouthpiece for dirt or other objects. Be sure that the canister is fully and firmly inserted in the mouthpiece. 2. If you are using the inhaler for the first time or if you have not used the inhaler in more than 14 days, you will need to prime it. You may also need to prime the inhaler if it has been dropped. Ask your pharmacist or check the manufacturer's information if this happens. To prime the inhaler, shake it well and then press down on the canister 4 times to release 4 sprays into the air, away from your face. Be careful not to get albuterol in your eyes. 3. Shake the inhaler well. 4. Breathe out as completely as possible through your mouth. 4. Hold the canister with the mouthpiece on the bottom, facing you and the canister pointing upward. Place the open end of the mouthpiece into your mouth. Close your lips tightly around the mouthpiece. 6. Breathe in slowly and deeply through the mouthpiece.At the same time, press down once on the container to spray the medication into your mouth. 7. Try to hold your breath for 10 seconds. remove the inhaler, and breathe out slowly. 8. If you were told to use 2 puffs, wait 1 minute and then repeat steps 3-7. 9. Replace the protective cap on the inhaler. 10. Clean your inhaler  regularly. Follow the manufacturer's directions carefully and ask your doctor or pharmacist if you have any questions about cleaning your inhaler.  Check the back of the inhaler to keep track of the total number of doses left on the inhaler.     Instructions For Advair Diskus Take the ADVAIR DISKUS out of the box and foil overwrap pouch. Write the "Pouch opened" and "Use by" dates on the label on top of the DISKUS. The "Use by" date is 1 month from date of opening the pouch.     * The DISKUS will be in the closed position when the pouch is opened.     * The dose indicator on the top of the DISKUS tells you how many doses are left. The dose indicator number will decrease each time you use the DISKUS. After you have used 55 doses from the DISKUS, the numbers 5 to 0 will appear in red to warn you that there are only a few doses left. If you are using a "sample" DISKUS, the numbers 5 to 0 will appear in red after 23 doses.  Taking a dose from the DISKUS requires the following 3 simple steps: Open, Click, Inhale.  2. OPEN Hold the DISKUS in one hand and put the thumb of your other hand on the thumbgrip. Push your thumb away from you as far as it will go until the mouthpiece appears and snaps into position.  2.   CLICK Hold the DISKUS in a level, flat position with the mouthpiece toward you. Slide the lever away from you as far as it will go until it clicks. The DISKUS is now ready to use. Every time the lever is pushed back, a dose is ready to be inhaled. This is shown by a decrease in numbers on the dose counter. To avoid releasing or wasting doses once the DISKUS is ready: Do not close the DISKUS. Do not tilt the DISKUS. Do not play with the lever. Do not move the lever more than once.  3.   INHALE Before inhaling your dose from the DISKUS, breathe out (exhale) fully while holding the DISKUS level and away from your mouth. Remember, never breathe out into the DISKUS mouthpiece.  Put the  mouthpiece to your lips. Breathe in quickly and deeply through the DISKUS. Do not breathe in through your nose.  Remove the DISKUS from your mouth. Hold your breath for about 10 seconds, or for as long as is comfortable. Breathe out slowly.  The DISKUS delivers your dose of medicine as a very fine powder. Most patients can taste or feel the powder. Do not use another dose from the DISKUS if you do not feel or taste the medicine.  Rinse your mouth with water after breathing in the medicine. Spit the water out. Do not swallow.  4.  CLOSE the DISKUS when you are finished taking a dose so that the DISKUS will be ready for you to take your next dose. Put your thumb on the thumbgrip and slide the thumbgrip back toward you as far as it will go. The DISKUS will click shut. The lever will automatically return to its original position. The DISKUS is now ready for you to take your next scheduled dose, due in about 12 hours. (Repeat the steps 1 to 4.)  REMEMBER: Never breathe into the DISKUS. Never take the DISKUS apart. Always ready and use the DISKUS in a level, flat position. Do not use the DISKUS with a spacer device. After each dose, rinse your mouth with water and spit the water out. Do not swallow. Never wash the mouthpiece or any part of the DISKUS. Keep it dry. Always keep the DISKUS in a dry place. Never take an extra dose, even if you did not taste or feel the medicine.   By learning about asthma and how it can be controlled, you take an important step toward managing this disease. Work closely with your asthma care team to learn all you can about your asthma, how to avoid triggers, what your medications do, and how to take them correctly. With proper care, you can live free of asthma symptoms and maintain a normal, healthy lifestyle.   What is asthma? Asthma is a chronic disease that affects the airways of the lungs. During normal breathing, the bands of muscle that surround the airways are  relaxed and air moves freely. During an asthma episode or "attack," there are three main changes that stop air from moving easily through the airways:  The bands of muscle that surround the airways tighten and make the airways narrow. This tightening is called bronchospasm.   The lining of the airways becomes swollen or inflamed.   The cells that line the airways produce more mucus, which is thicker than normal and clogs the airways.  These three factors - bronchospasm, inflammation, and mucus production - cause symptoms such as difficulty breathing, wheezing, and coughing.  What are  the most common symptoms of asthma? Asthma symptoms are not the same for everyone. They can even change from episode to episode in the same person. Also, you may have only one symptom of asthma, such as cough, but another person may have all the symptoms of asthma. It is important to know all the symptoms of asthma and to be aware that your asthma can present in any of these ways at any time. The most common symptoms include: . Coughing, especially at night  . Shortness of breath  . Wheezing  . Chest tightness, pain, or pressure   Who is affected by asthma? Asthma affects 22 million Americans; about 6 million of these are children under age 32. People who have a family history of asthma have an increased risk of developing the disease. Asthma is also more common in people who have allergies or who are exposed to tobacco smoke. However, anyone can develop asthma at any time. Some people may have asthma all of their lives, while others may develop it as adults.  What causes asthma? The airways in a person with asthma are very sensitive and react to many things, or "triggers." Contact with these triggers causes asthma symptoms. One of the most important parts of asthma control is to identify your triggers and then avoid them when possible. The only trigger you do not want to avoid is exercise. Pre-treatment with  medicines before exercise can allow you to stay active yet avoid asthma symptoms. Common asthma triggers include: 1. Infections (colds, viruses, flu, sinus infections)  2. Exercise  3. Weather (changes in temperature and/or humidity, cold air)  4. Tobacco smoke  5. Allergens (dust mites, pollens, pets, mold spores, cockroaches, and sometimes foods)  6. Irritants (strong odors from cleaning products, perfume, wood smoke, air pollution)  7. Strong emotions such as crying or laughing hard  8. Some medications   How is asthma diagnosed? To diagnose asthma, your doctor will first review your medical history, family history, and symptoms. Your doctor will want to know any past history of breathing problems you may have had, as well as a family history of asthma, allergies, eczema (a bumpy, itchy skin rash caused by allergies), or other lung disease. It is important that you describe your symptoms in detail (cough, wheeze, shortness of breath, chest tightness), including when and how often they occur. The doctor will perform a physical examination and listen to your heart and lungs. He or she may also order breathing tests, allergy tests, blood tests, and chest and sinus X-rays. The tests will find out if you do have asthma and if there are any other conditions that are contributing factors.  How is asthma treated? Asthma can be controlled, but not cured. It is not normal to have frequent symptoms, trouble sleeping, or trouble completing tasks. Appropriate asthma care will prevent symptoms and visits to the emergency room and hospital. Asthma medicines are one of the mainstays of asthma treatment. The drugs used to treat asthma are explained below.  Anti-inflammatories: These are the most important drugs for most people with asthma. Anti-inflammatory drugs reduce swelling and mucus production in the airways. As a result, airways are less sensitive and less likely to react to triggers. These medications  need to be taken daily and may need to be taken for several weeks before they begin to control asthma. Anti-inflammatory medicines lead to fewer symptoms, better airflow, less sensitive airways, less airway damage, and fewer asthma attacks. If taken every day, they CONTROL  or prevent asthma symptoms.   Bronchodilators: These drugs relax the muscle bands that tighten around the airways. This action opens the airways, letting more air in and out of the lungs and improving breathing. Bronchodilators also help clear mucus from the lungs. As the airways open, the mucus moves more freely and can be coughed out more easily. In short-acting forms, bronchodilators RELIEVE or stop asthma symptoms by quickly opening the airways and are very helpful during an asthma episode. In long-acting forms, bronchodilators provide CONTROL of asthma symptoms and prevent asthma episodes.  Asthma drugs can be taken in a variety of ways. Inhaling the medications by using a metered dose inhaler, dry powder inhaler, or nebulizer is one way of taking asthma medicines. Oral medicines (pills or liquids you swallow) may also be prescribed.  Asthma severity Asthma is classified as either "intermittent" (comes and goes) or "persistent" (lasting). Persistent asthma is further described as being mild, moderate, or severe. The severity of asthma is based on how often you have symptoms both during the day and night, as well as by the results of lung function tests and by how well you can perform activities. The "severity" of asthma refers to how "intense" or "strong" your asthma is.  Asthma control Asthma control is the goal of asthma treatment. Regardless of your asthma severity, it may or may not be controlled. Asthma control means: . You are able to do everything you want to do at work and home  . You have no (or minimal) asthma symptoms  . You do not wake up from your sleep or earlier than usual in the morning due to asthma  . You rarely  need to use your reliever medicine (inhaler)  Another major part of your treatment is that you are happy with your asthma care and believe your asthma is controlled.  Monitoring symptoms A key part of treatment is keeping track of how well your lungs are working. Monitoring your symptoms  what they are, how and when they happen, and how severe they are  is an important part of being able to control your asthma.  Sometimes asthma is monitored using a peak flow meter. A peak flow (PF) meter measures how fast the air comes out of your lungs. It can help you know when your asthma is getting worse, sometimes even before you have symptoms. By taking daily peak flow readings, you can learn when to adjust medications to keep asthma under good control. It is also used to create your asthma action plan (see below). Your doctor can use your peak flow readings to adjust your treatment plan in some cases.  Asthma Action Plan Based on your history and asthma severity, you and your doctor will develop a care plan called an "asthma action plan." The asthma action plan describes when and how to use your medicines, actions to take when asthma worsens, and when to seek emergency care. Make sure you understand this plan. If you do not, ask your asthma care provider any questions you may have. Your asthma action plan is one of the keys to controlling asthma. Keep it readily available to remind you of what you need to do every day to control asthma and what you need to do when symptoms occur.  Goals of asthma therapy These are the goals of asthma treatment: . Live an active, normal life  . Prevent chronic and troublesome symptoms  . Attend work or school every day  . Perform daily activities without difficulty  .  Stop urgent visits to the doctor, emergency department, or hospital  . Use and adjust medications to control asthma with few or no side effects

## 2020-02-26 ENCOUNTER — Ambulatory Visit: Payer: BC Managed Care – PPO | Admitting: Nutrition

## 2020-02-27 ENCOUNTER — Ambulatory Visit (HOSPITAL_COMMUNITY): Payer: BC Managed Care – PPO | Admitting: Psychiatry

## 2020-03-07 ENCOUNTER — Ambulatory Visit (HOSPITAL_COMMUNITY): Payer: BC Managed Care – PPO | Admitting: Psychiatry

## 2020-03-13 NOTE — Progress Notes (Deleted)
Anita Hodges/PA/NP OP Progress Note  03/13/2020 3:38 PM Anita Hodges  MRN:  [id number]  Chief Complaint:  HPI: *** Visit Diagnosis: No diagnosis found.  Past Psychiatric History: Please see initial evaluation for full details. I have reviewed the history. No updates at this time.     Past Medical History:  Past Medical History:  Diagnosis Date  . Allergy   . Anxiety   . Hypertension   . Obesity     Past Surgical History:  Procedure Laterality Date  . CHOLECYSTECTOMY N/A 12/27/2018   Procedure: LAPAROSCOPIC CHOLECYSTECTOMY;  Surgeon: Aviva Signs, Hodges;  Location: [facility];  Service: General;  Laterality: N/A;  pt knows to arrive at 6:15  . COLONOSCOPY WITH PROPOFOL N/A 07/23/2019   Procedure: COLONOSCOPY WITH PROPOFOL;  Surgeon: Daneil Dolin, Hodges;  Location: [facility];  Service: Endoscopy;  Laterality: N/A;  1:15pm  . ESOPHAGOGASTRODUODENOSCOPY (EGD) WITH PROPOFOL N/A 12/18/2018   mild erosive reflux esophagitis, normal stomach, normal duodenum.   Anita Hodges POLYPECTOMY  07/23/2019   Procedure: POLYPECTOMY;  Surgeon: Daneil Dolin, Hodges;  [facility]: [facility];  Service: Endoscopy;;  . TONSILLECTOMY    . WISDOM TOOTH EXTRACTION      Family Psychiatric History: Please see initial evaluation for full details. I have reviewed the history. No updates at this time.     Family History:  Family History  Problem Relation Age of Onset  . Hypertension Mother   . Colon polyps Mother        unknown age of onset  . Gallbladder disease Mother   . Diabetes Father   . Gallbladder disease Father   . COPD Father   . Cancer Maternal Grandfather        ?  Anita Hodges Cancer Paternal Grandmother        ?  . Colon cancer Neg Hx     Social History:  Social History   Socioeconomic History  . Marital status: Single    Spouse name: Not on file  . Number of children: Not on file  . Years of education: Not on file  . Highest education level: Not on file  Occupational History  . Not on file  Tobacco  Use  . Smoking status: Never Smoker  . Smokeless tobacco: Never Used  Substance and Sexual Activity  . Alcohol use: No    Alcohol/week: 0.0 standard drinks  . Drug use: No  . Sexual activity: Yes    Partners: Male    Birth control/protection: I.U.D.    Comment: 1st intercourse- 72, partners- 64, current partner- 4 months   Other Topics Concern  . Not on file  Social History Narrative   No children.      Social Determinants of Health   Financial Resource Strain:   . Difficulty of Paying Living Expenses:   Food Insecurity:   . Worried About Charity fundraiser in the Last Year:   . Arboriculturist in the Last Year:   Transportation Needs:   . Film/video editor (Medical):   Anita Hodges Lack of Transportation (Non-Medical):   Physical Activity:   . Days of Exercise per Week:   . Minutes of Exercise per Session:   Stress:   . Feeling of Stress :   Social Connections:   . Frequency of Communication with Friends and Family:   . Frequency of Social Gatherings with Friends and Family:   . Attends Religious Services:   . Active Member of Clubs or Organizations:   .  Attends Archivist Meetings:   Anita Hodges Marital Status:     Allergies:  Allergies  Allergen Reactions  . Codeine Other (See Comments)    DIZZINESS AND COLD (SICK)    Metabolic Disorder Labs: Lab Results  Component Value Date   HGBA1C 5.8 (H) 01/09/2020   MPG 120 01/09/2020   MPG 114 01/02/2013   Lab Results  Component Value Date   PROLACTIN 14.0 02/06/2015   Lab Results  Component Value Date   CHOL 145 05/26/2016   TRIG 77 05/26/2016   HDL 35 (L) 05/26/2016   CHOLHDL 4.1 05/26/2016   VLDL 15 05/26/2016   LDLCALC 95 05/26/2016   LDLCALC 89 01/20/2015   Lab Results  Component Value Date   TSH 2.78 01/09/2020   TSH 1.69 05/26/2016    Therapeutic Level Labs: No results found for: LITHIUM No results found for: VALPROATE No components found for:  CBMZ  Current Medications: Current Outpatient  Medications  Medication Sig Dispense Refill  . acetaZOLAMIDE (DIAMOX) 125 MG tablet Take 125 mg by mouth daily.    Anita Hodges albuterol (PROVENTIL) (2.5 MG/3ML) 0.083% nebulizer solution Take 3 mLs (2.5 mg total) by nebulization every 6 (six) hours as needed for wheezing or shortness of breath. 75 mL 2  . albuterol (VENTOLIN HFA) 108 (90 Base) MCG/ACT inhaler Inhale 1-2 puffs into the lungs every 6 (six) hours as needed for wheezing or shortness of breath. 18 g 0  . ALPRAZolam (XANAX) 0.25 MG tablet Take 1 tablet (0.25 mg total) by mouth daily as needed for anxiety. (Patient not taking: Reported on 02/07/2020) 30 tablet 0  . amLODipine (NORVASC) 10 MG tablet Take 10 mg by mouth daily.    Anita Hodges atenolol (TENORMIN) 50 MG tablet Take 50 mg by mouth daily.    . benzonatate (TESSALON) 100 MG capsule Take 1 capsule (100 mg total) by mouth every 8 (eight) hours. 21 capsule 0  . budesonide-formoterol (SYMBICORT) 160-4.5 MCG/ACT inhaler Inhale 2 puffs into the lungs 2 (two) times daily.    . DULoxetine (CYMBALTA) 30 MG capsule 30 mg daily for one week, then discontinue 7 capsule 0  . DULoxetine (CYMBALTA) 60 MG capsule Take 60 mg by mouth daily.    Anita Hodges escitalopram (LEXAPRO) 10 MG tablet 5 mg daily for one week, then 10 mg daily (Patient not taking: Reported on 02/19/2020) 30 tablet 1  . fluticasone (FLONASE) 50 MCG/ACT nasal spray Place 1 spray into both nostrils daily for 14 days. 16 g 0  . Fluticasone-Salmeterol (ADVAIR DISKUS) 250-50 MCG/DOSE AEPB Inhale 1 puff into the lungs in the morning and at bedtime. 60 each 5  . Galcanezumab-gnlm (EMGALITY) 120 MG/ML SOSY Inject into the skin.    . hyoscyamine (LEVSIN SL) 0.125 MG SL tablet Place 1 tablet (0.125 mg total) under the tongue every 4 (four) hours as needed. For abdominal cramping. Do not take with Bentyl. (Patient not taking: Reported on 02/19/2020) 60 tablet 5  . levocetirizine (XYZAL) 5 MG tablet Take 5 mg by mouth every morning.     . montelukast (SINGULAIR) 10 MG  tablet Take 10 mg by mouth at bedtime.    . ondansetron (ZOFRAN) 4 MG tablet Take 1 tablet (4 mg total) by mouth every 8 (eight) hours as needed for nausea or vomiting. 60 tablet 3  . promethazine (PHENERGAN) 25 MG tablet Take 0.5 tablets (12.5 mg total) by mouth every 6 (six) hours as needed for nausea or vomiting. 30 tablet 0  . topiramate (TOPAMAX) 100  MG tablet Take 100 mg by mouth 2 (two) times daily.     No current facility-administered medications for this visit.     Musculoskeletal: Strength & Muscle Tone: N/A Gait & Station: N/A Patient leans: N/A  Psychiatric Specialty Exam: Review of Systems  There were no vitals taken for this visit.There is no height or weight on file to calculate BMI.  General Appearance: {Appearance:22683}  Eye Contact:  {BHH EYE CONTACT:22684}  Speech:  Clear and Coherent  Volume:  Normal  Mood:  {BHH MOOD:22306}  Affect:  {Affect (PAA):22687}  Thought Process:  Coherent  Orientation:  Full (Time, Place, and Person)  Thought Content: Logical   Suicidal Thoughts:  {ST/HT (PAA):22692}  Homicidal Thoughts:  {ST/HT (PAA):22692}  Memory:  Immediate;   Good  Judgement:  {Judgement (PAA):22694}  Insight:  {Insight (PAA):22695}  Psychomotor Activity:  Normal  Concentration:  Concentration: Good and Attention Span: Good  Recall:  Good  Fund of Knowledge: Good  Language: Good  Akathisia:  No  Handed:  Right  AIMS (if indicated): not done  Assets:  Communication Skills Desire for Improvement  ADL's:  Intact  Cognition: WNL  Sleep:  {BHH GOOD/FAIR/POOR:22877}   Screenings: PHQ2-9     Office Visit from 12/29/2017 in Primary Care at [place] from 04/04/2014 in Primary Care at [facility]  PHQ-2 Total Score  0  0       Assessment and Plan:  Anita Hodges is a 34 y.o. year old female with a history of anxiety,, idiopathic intracranial hypertension, hypertension, obesity  , who presents for follow up appointment for No diagnosis found.  #  MDD, moderate, recurrent without psychotic features  She reports worsening in depressive symptoms in the context of demoralization from intracranial hypertension, and ending appointment as of today.  Will switch from duloxetine to Lexapro to target depression given she reports limited benefit from duloxetine.  Discussed possible risk of withdrawal symptoms and risk of GI side effect.  She will greatly benefit from supportive therapy/CBT; will make referral.   Plan 1. Start lexapro 5 mg daily for one week, then 10 mg daily  2. Decrease duloxetine 30 mg daily for one week, then discontinue  3. Referral to therapy  4. Next appointment: 5/13 at 3 PM for 30 mins, video - on Topamax  The patient demonstrates the following risk factors for suicide: Chronic risk factors for suicide include: psychiatric disorder of depression. Acute risk factors for suicide include: unemployment and loss (financial, interpersonal, professional). Protective factors for this patient include: positive social support. Considering these factors, the overall suicide risk at this point appears to be low. Patient is appropriate for outpatient follow up.  Norman Clay, Hodges 03/13/2020, 3:38 PM

## 2020-03-20 ENCOUNTER — Telehealth (HOSPITAL_COMMUNITY): Payer: Self-pay | Admitting: Psychiatry

## 2020-04-15 ENCOUNTER — Other Ambulatory Visit (HOSPITAL_COMMUNITY)
Admission: RE | Admit: 2020-04-15 | Discharge: 2020-04-15 | Disposition: A | Discharge status: Home / Self Care | Payer: HRSA Program | Source: Ambulatory Visit | Attending: Internal Medicine | Admitting: Internal Medicine

## 2020-04-15 ENCOUNTER — Other Ambulatory Visit (HOSPITAL_COMMUNITY): Payer: BC Managed Care – PPO

## 2020-04-15 ENCOUNTER — Other Ambulatory Visit: Payer: Self-pay

## 2020-04-15 DIAGNOSIS — Z20822 Contact with and (suspected) exposure to covid-19: Secondary | ICD-10-CM | POA: Insufficient documentation

## 2020-04-15 DIAGNOSIS — Z01812 Encounter for preprocedural laboratory examination: Secondary | ICD-10-CM | POA: Insufficient documentation

## 2020-04-16 LAB — SARS CORONAVIRUS 2 (TAT 6-24 HRS): SARS Coronavirus 2: NEGATIVE

## 2020-04-17 ENCOUNTER — Other Ambulatory Visit: Payer: Self-pay | Admitting: *Deleted

## 2020-04-17 DIAGNOSIS — R0602 Shortness of breath: Secondary | ICD-10-CM

## 2020-04-18 ENCOUNTER — Encounter: Payer: Self-pay | Admitting: Adult Health

## 2020-04-18 ENCOUNTER — Ambulatory Visit: Payer: BC Managed Care – PPO | Admitting: Internal Medicine

## 2020-04-18 ENCOUNTER — Ambulatory Visit (INDEPENDENT_AMBULATORY_CARE_PROVIDER_SITE_OTHER): Payer: Self-pay

## 2020-04-18 ENCOUNTER — Other Ambulatory Visit: Payer: Self-pay

## 2020-04-18 ENCOUNTER — Ambulatory Visit (INDEPENDENT_AMBULATORY_CARE_PROVIDER_SITE_OTHER): Payer: Self-pay | Admitting: Adult Health

## 2020-04-18 DIAGNOSIS — R0602 Shortness of breath: Secondary | ICD-10-CM

## 2020-04-18 DIAGNOSIS — J45909 Unspecified asthma, uncomplicated: Secondary | ICD-10-CM | POA: Insufficient documentation

## 2020-04-18 DIAGNOSIS — J309 Allergic rhinitis, unspecified: Secondary | ICD-10-CM

## 2020-04-18 DIAGNOSIS — Z6841 Body Mass Index (BMI) 40.0 and over, adult: Secondary | ICD-10-CM

## 2020-04-18 DIAGNOSIS — J4530 Mild persistent asthma, uncomplicated: Secondary | ICD-10-CM

## 2020-04-18 LAB — NITRIC OXIDE: Nitric Oxide: 27

## 2020-04-18 NOTE — Assessment & Plan Note (Signed)
BMI is 68.  Healthy weight loss discussed.

## 2020-04-18 NOTE — Patient Instructions (Addendum)
Begin Advair 1 puff Twice daily   Call back if your insurance does not come in soon.  Can refer to [facility] or [facility] [place] [phone] ) Also can do patient assistance for other inhalers (if not able to get Advair) .  Follow up with Dr. Shearon Stalls in 3-4 months and As needed

## 2020-04-18 NOTE — Assessment & Plan Note (Signed)
Continue on current regimen .   

## 2020-04-18 NOTE — Progress Notes (Signed)
Virtual Visit via Video Note  I connected with Anita Hodges on 04/23/20 at 11:30 AM EDT by a video enabled telemedicine application and verified that I am speaking with the correct person using two identifiers.   I discussed the limitations of evaluation and management by telemedicine and the availability of in person appointments. The patient expressed understanding and agreed to proceed.    I discussed the assessment and treatment plan with the patient. The patient was provided an opportunity to ask questions and all were answered. The patient agreed with the plan and demonstrated an understanding of the instructions.   The patient was advised to call back or seek an in-person evaluation if the symptoms worsen or if the condition fails to improve as anticipated  Location: patient- home, provider- home office   I provided 15 minutes of non-face-to-face time during this encounter.   Norman Clay, MD    [facility] MD/PA/NP OP Progress Note  04/23/2020 12:01 PM Anita Hodges  MRN:  [id number]  Chief Complaint:  Chief Complaint    Depression; Follow-up     HPI:  This is a follow-up appointment for depression.  She states that she has been feeling horrible.  She feels irritable and depressed.  She stays in the house most of the time sleeping due to fatigue and headache.  Her boyfriend visits her, and she enjoys watching TV together.  She occasionally contacts with her parents.  Although she reports occasional passive SI feeling that nobody would give her a crap if she were to die, she denies any intent or plans.  She has hypersomnia.  She has decreased appetite.  She eats meal once a day and may eat snack.  She has difficulty in concentration.  She feels anxious at times.  She denies panic attacks.  She is not on duloxetine anymore.  She has not started Lexapro as she lost her insurance.  She is willing to try this medication when her insurance card is arrived.    Visit Diagnosis:     ICD-10-CM   1. MDD (major depressive disorder), recurrent episode, moderate ([place])  F33.1     Past Psychiatric History: Please see initial evaluation for full details. I have reviewed the history. No updates at this time.     Past Medical History:  Past Medical History:  Diagnosis Date  . Allergy   . Anxiety   . Hypertension   . Obesity     Past Surgical History:  Procedure Laterality Date  . CHOLECYSTECTOMY N/A 12/27/2018   Procedure: LAPAROSCOPIC CHOLECYSTECTOMY;  Surgeon: Aviva Signs, MD;  Location: [facility];  Service: General;  Laterality: N/A;  pt knows to arrive at 6:15  . COLONOSCOPY WITH PROPOFOL N/A 07/23/2019   Procedure: COLONOSCOPY WITH PROPOFOL;  Surgeon: Daneil Dolin, MD;  [facility];  Service: Endoscopy;  Laterality: N/A;  1:15pm  . ESOPHAGOGASTRODUODENOSCOPY (EGD) WITH PROPOFOL N/A 12/18/2018   mild erosive reflux esophagitis, normal stomach, normal duodenum.   Marland Kitchen POLYPECTOMY  07/23/2019   Procedure: POLYPECTOMY;  Surgeon: Daneil Dolin, MD;  [facility];  Service: Endoscopy;;  . TONSILLECTOMY    . WISDOM TOOTH EXTRACTION      Family Psychiatric History: Please see initial evaluation for full details. I have reviewed the history. No updates at this time.     Family History:  Family History  Problem Relation Age of Onset  . Hypertension Mother   . Colon polyps Mother  unknown age of onset  . Gallbladder disease Mother   . Diabetes Father   . Gallbladder disease Father   . COPD Father   . Cancer Maternal Grandfather        ?  Marland Kitchen Cancer Paternal Grandmother        ?  . Colon cancer Neg Hx     Social History:  Social History   Socioeconomic History  . Marital status: Single    Spouse name: Not on file  . Number of children: Not on file  . Years of education: Not on file  . Highest education level: Not on file  Occupational History  . Not on file  Tobacco Use  . Smoking status: Never Smoker  . Smokeless tobacco:  Never Used  Vaping Use  . Vaping Use: Never used  Substance and Sexual Activity  . Alcohol use: No    Alcohol/week: 0.0 standard drinks  . Drug use: No  . Sexual activity: Yes    Partners: Male    Birth control/protection: I.U.D.    Comment: 1st intercourse- 5, partners- 52, current partner- 4 months   Other Topics Concern  . Not on file  Social History Narrative   No children.      Social Determinants of Health   Financial Resource Strain:   . Difficulty of Paying Living Expenses:   Food Insecurity:   . Worried About Charity fundraiser in the Last Year:   . Arboriculturist in the Last Year:   Transportation Needs:   . Film/video editor (Medical):   Marland Kitchen Lack of Transportation (Non-Medical):   Physical Activity:   . Days of Exercise per Week:   . Minutes of Exercise per Session:   Stress:   . Feeling of Stress :   Social Connections:   . Frequency of Communication with Friends and Family:   . Frequency of Social Gatherings with Friends and Family:   . Attends Religious Services:   . Active Member of Clubs or Organizations:   . Attends Archivist Meetings:   Marland Kitchen Marital Status:     Allergies:  Allergies  Allergen Reactions  . Codeine Other (See Comments)    DIZZINESS AND COLD (SICK)    Metabolic Disorder Labs: Lab Results  Component Value Date   HGBA1C 5.8 (H) 01/09/2020   MPG 120 01/09/2020   MPG 114 01/02/2013   Lab Results  Component Value Date   PROLACTIN 14.0 02/06/2015   Lab Results  Component Value Date   CHOL 145 05/26/2016   TRIG 77 05/26/2016   HDL 35 (L) 05/26/2016   CHOLHDL 4.1 05/26/2016   VLDL 15 05/26/2016   LDLCALC 95 05/26/2016   LDLCALC 89 01/20/2015   Lab Results  Component Value Date   TSH 2.78 01/09/2020   TSH 1.69 05/26/2016    Therapeutic Level Labs: No results found for: LITHIUM No results found for: VALPROATE No components found for:  CBMZ  Current Medications: Current Outpatient Medications   Medication Sig Dispense Refill  . acetaZOLAMIDE (DIAMOX) 125 MG tablet Take 125 mg by mouth daily.    Marland Kitchen albuterol (PROVENTIL) (2.5 MG/3ML) 0.083% nebulizer solution Take 3 mLs (2.5 mg total) by nebulization every 6 (six) hours as needed for wheezing or shortness of breath. 75 mL 2  . albuterol (VENTOLIN HFA) 108 (90 Base) MCG/ACT inhaler Inhale 1-2 puffs into the lungs every 6 (six) hours as needed for wheezing or shortness of breath. 18 g 0  .  amLODipine (NORVASC) 10 MG tablet Take 10 mg by mouth daily.    Marland Kitchen atenolol (TENORMIN) 50 MG tablet Take 50 mg by mouth daily.    . benzonatate (TESSALON) 100 MG capsule Take 1 capsule (100 mg total) by mouth every 8 (eight) hours. 21 capsule 0  . budesonide-formoterol (SYMBICORT) 160-4.5 MCG/ACT inhaler Inhale 2 puffs into the lungs 2 (two) times daily.    Marland Kitchen escitalopram (LEXAPRO) 10 MG tablet 5 mg daily for one week, then 10 mg daily 30 tablet 1  . fluticasone (FLONASE) 50 MCG/ACT nasal spray Place 1 spray into both nostrils daily for 14 days. 16 g 0  . Fluticasone-Salmeterol (ADVAIR DISKUS) 250-50 MCG/DOSE AEPB Inhale 1 puff into the lungs in the morning and at bedtime. 60 each 5  . Galcanezumab-gnlm (EMGALITY) 120 MG/ML SOSY Inject into the skin.    Marland Kitchen levocetirizine (XYZAL) 5 MG tablet Take 5 mg by mouth every morning.     . montelukast (SINGULAIR) 10 MG tablet Take 10 mg by mouth at bedtime.    . ondansetron (ZOFRAN) 4 MG tablet Take 1 tablet (4 mg total) by mouth every 8 (eight) hours as needed for nausea or vomiting. 60 tablet 3  . promethazine (PHENERGAN) 25 MG tablet Take 0.5 tablets (12.5 mg total) by mouth every 6 (six) hours as needed for nausea or vomiting. 30 tablet 0  . topiramate (TOPAMAX) 100 MG tablet Take 100 mg by mouth 2 (two) times daily.     No current facility-administered medications for this visit.     Musculoskeletal: Strength & Muscle Tone: N/A Gait & Station: N/A Patient leans: N/A  Psychiatric Specialty Exam: Review  of Systems  Psychiatric/Behavioral: Positive for dysphoric mood, sleep disturbance and suicidal ideas. Negative for agitation, behavioral problems, confusion, decreased concentration, hallucinations and self-injury. The patient is nervous/anxious. The patient is not hyperactive.   All other systems reviewed and are negative.   There were no vitals taken for this visit.There is no height or weight on file to calculate BMI.  General Appearance: Fairly Groomed  Eye Contact:  Good  Speech:  Clear and Coherent  Volume:  Normal  Mood:  Depressed and awful  Affect:  Appropriate, Congruent and Restricted  Thought Process:  Coherent  Orientation:  Full (Time, Place, and Person)  Thought Content: Logical   Suicidal Thoughts:  Yes.  without intent/plan  Homicidal Thoughts:  No  Memory:  Immediate;   Good  Judgement:  Good  Insight:  Fair  Psychomotor Activity:  Normal  Concentration:  Concentration: Good and Attention Span: Good  Recall:  Good  Fund of Knowledge: Good  Language: Good  Akathisia:  No  Handed:  Right  AIMS (if indicated): not done  Assets:  Communication Skills Desire for Improvement  ADL's:  Intact  Cognition: WNL  Sleep:  hypersomnia   Screenings: PHQ2-9     Office Visit from 12/29/2017 in Primary Care at [place] from 04/04/2014 in Primary Care at [facility]  PHQ-2 Total Score 0 0       Assessment and Plan:  Anita Hodges is a 34 y.o. year old female with a history of  anxiety, idiopathic intracranial hypertension, hypertension, obesity, who presents for follow up appointment for below.   1. MDD (major depressive disorder), recurrent episode, moderate ([place]) She continues to have depressive symptoms in the context of depolarization from intracranial hypertension and unemployment.  She has not started Lexapro which was discussed at the last visit due to issues with  insurance.  She is amenable to try Lexapro for depression.  She is encouraged to continue  therapy.   Plan 1. Start lexapro 5 mg daily for one week, then 10 mg daily  2. Next appointment: 7/27 at 3:20 for 30 mins, video - She sees a therapist at [facility] in [place] - on Topamax  I have reviewed suicide assessment in detail. No change in the following assessment.   The patient demonstrates the following risk factors for suicide: Chronic risk factors for suicide include: psychiatric disorder of depression. Acute risk factors for suicide include: unemployment and loss (financial, interpersonal, professional). Protective factors for this patient include: positive social support. Considering these factors, the overall suicide risk at this point appears to be low. Patient is appropriate for outpatient follow up.   Norman Clay, MD 04/23/2020, 12:01 PM

## 2020-04-18 NOTE — Progress Notes (Signed)
@Patient  ID: Anita Hodges, female    DOB: 12-19-1985, 34 y.o.   MRN: [id number]  Chief Complaint  Patient presents with  . Follow-up    Dyspnea     Referring provider: Caren Macadam, MD  HPI: 34 year old female never smoker seen for pulmonary consult 02/19/2020 for cough and shortness of breath felt to have Asthma  Medical history significant for idiopathic intracranial hypertension , chronic migraines, followed by neurology   TEST/EVENTS :   Dyspnea workup :  Current Regimen: albuterol Asthma Triggers: stress anxiety exertion Exacerbations in the last year: 2 requiring prednisone History of hospitalization or intubation: never Hives: none. Has red rash which is not pruritic.  Allergy Testing: never had GERD: yes Allergic Rhinitis: singulair, xyzal, fluticasone  Occupation: previously worked at Museum/gallery curator, recently unemployed. Exposures: lives at home alone, pet dog.  Smoking history: never smoker, father smoked in home as a child.   02/19/20 - ACT 8   04/18/2020 Follow up : Asthma and AR  Patient presents for a 68-month follow-up for asthma and allergic rhinitis.  Patient was seen last visit for a pulmonary consult for wheezing and shortness of breath.  Patient was felt to have some mild persistent asthma.  She was started on Advair twice daily.  Continued on allergy regimen with Singulair Xyzal and fluticasone. Patient was set up for pre and post spirometry today that mild to moderate Restriction.  FEV1 68%, ratio 83, FVC 68%.  No significant bronchodilator response.  There was some mid flow obstruction. FENO 27  Unfortunately patient has lost her insurance was unable to get Advair.  Patient does have some albuterol.  Patient says she is going to be getting a new soon. Gets short of breath with activity.  Does have some intermittent wheezing.  And minimum cough.  She has some postnasal drainage on occasion.  Tested for OSA but says was not bad enough for CPAP .     Allergies  Allergen Reactions  . Codeine Other (See Comments)    DIZZINESS AND COLD (SICK)    Immunization History  Administered Date(s) Administered  . Influenza, Quadrivalent, Recombinant, Inj, Pf 11/21/2019  . Influenza,inj,Quad PF,6+ Mos 07/27/2018    Past Medical History:  Diagnosis Date  . Allergy   . Anxiety   . Hypertension   . Obesity     Tobacco History: Social History   Tobacco Use  Smoking Status Never Smoker  Smokeless Tobacco Never Used   Counseling given: Not Answered   Outpatient Medications Prior to Visit  Medication Sig Dispense Refill  . acetaZOLAMIDE (DIAMOX) 125 MG tablet Take 125 mg by mouth daily.    Marland Kitchen albuterol (PROVENTIL) (2.5 MG/3ML) 0.083% nebulizer solution Take 3 mLs (2.5 mg total) by nebulization every 6 (six) hours as needed for wheezing or shortness of breath. 75 mL 2  . albuterol (VENTOLIN HFA) 108 (90 Base) MCG/ACT inhaler Inhale 1-2 puffs into the lungs every 6 (six) hours as needed for wheezing or shortness of breath. 18 g 0  . amLODipine (NORVASC) 10 MG tablet Take 10 mg by mouth daily.    Marland Kitchen atenolol (TENORMIN) 50 MG tablet Take 50 mg by mouth daily.    . benzonatate (TESSALON) 100 MG capsule Take 1 capsule (100 mg total) by mouth every 8 (eight) hours. 21 capsule 0  . budesonide-formoterol (SYMBICORT) 160-4.5 MCG/ACT inhaler Inhale 2 puffs into the lungs 2 (two) times daily.    . DULoxetine (CYMBALTA) 30 MG capsule 30 mg daily for one  week, then discontinue 7 capsule 0  . DULoxetine (CYMBALTA) 60 MG capsule Take 60 mg by mouth daily.    . fluticasone (FLONASE) 50 MCG/ACT nasal spray Place 1 spray into both nostrils daily for 14 days. 16 g 0  . Fluticasone-Salmeterol (ADVAIR DISKUS) 250-50 MCG/DOSE AEPB Inhale 1 puff into the lungs in the morning and at bedtime. 60 each 5  . Galcanezumab-gnlm (EMGALITY) 120 MG/ML SOSY Inject into the skin.    Marland Kitchen levocetirizine (XYZAL) 5 MG tablet Take 5 mg by mouth every morning.     . montelukast  (SINGULAIR) 10 MG tablet Take 10 mg by mouth at bedtime.    . ondansetron (ZOFRAN) 4 MG tablet Take 1 tablet (4 mg total) by mouth every 8 (eight) hours as needed for nausea or vomiting. 60 tablet 3  . promethazine (PHENERGAN) 25 MG tablet Take 0.5 tablets (12.5 mg total) by mouth every 6 (six) hours as needed for nausea or vomiting. 30 tablet 0  . topiramate (TOPAMAX) 100 MG tablet Take 100 mg by mouth 2 (two) times daily.    Marland Kitchen ALPRAZolam (XANAX) 0.25 MG tablet Take 1 tablet (0.25 mg total) by mouth daily as needed for anxiety. (Patient not taking: Reported on 02/07/2020) 30 tablet 0  . escitalopram (LEXAPRO) 10 MG tablet 5 mg daily for one week, then 10 mg daily (Patient not taking: Reported on 02/19/2020) 30 tablet 1  . hyoscyamine (LEVSIN SL) 0.125 MG SL tablet Place 1 tablet (0.125 mg total) under the tongue every 4 (four) hours as needed. For abdominal cramping. Do not take with Bentyl. (Patient not taking: Reported on 02/19/2020) 60 tablet 5   No facility-administered medications prior to visit.     Review of Systems:   Constitutional:   No  weight loss, night sweats,  Fevers, chills, fatigue, or  lassitude.  HEENT:   No headaches,  Difficulty swallowing,  Tooth/dental problems, or  Sore throat,                No sneezing, itching, ear ache,  +nasal congestion, post nasal drip,   CV:  No chest pain,  Orthopnea, PND, swelling in lower extremities, anasarca, dizziness, palpitations, syncope.   GI  No heartburn, indigestion, abdominal pain, nausea, vomiting, diarrhea, change in bowel habits, loss of appetite, bloody stools.   Resp:   No chest wall deformity  Skin: no rash or lesions.  GU: no dysuria, change in color of urine, no urgency or frequency.  No flank pain, no hematuria   MS:  No joint pain or swelling.  No decreased range of motion.  No back pain.    Physical Exam  BP 126/84 (BP Location: Left Arm, Cuff Size: Large)   Pulse 90   Ht 5\' 5"  (1.651 m)   Wt (!) 411 lb  (186.4 kg)   SpO2 96%   BMI 68.39 kg/m   GEN: A/Ox3; pleasant , NAD, BMI 68   HEENT:  /AT,   NOSE-clear, THROAT-clear, no lesions, no postnasal drip or exudate noted.   NECK:  Supple w/ fair ROM; no JVD; normal carotid impulses w/o bruits; no thyromegaly or nodules palpated; no lymphadenopathy.    RESP  Clear  P & A; w/o, wheezes/ rales/ or rhonchi. no accessory muscle use, no dullness to percussion  CARD:  RRR, no m/r/g, tr peripheral edema, pulses intact, no cyanosis or clubbing.  GI:   Soft & nt; nml bowel sounds; no organomegaly or masses detected.   Musco: Warm bil, no  deformities or joint swelling noted.   Neuro: alert, no focal deficits noted.    Skin: Warm, no lesions or rashes    Lab Results:   BNP No results found for: BNP  ProBNP No results found for: PROBNP  Imaging: No results found.    No flowsheet data found.  Lab Results  Component Value Date   NITRICOXIDE 27 04/18/2020        Assessment & Plan:   Asthma Spirometry shows some mid flow obstruction and restriction.  Fino was elevated at 27.  Symptoms seem consistent with mild persistent asthma.  Also suspect that deconditioning and morbid obesity with restrictive lung disease are complaining a role in her breathing difficulties as well. Long discussion with patient as she has no insurance.  We have several options.  Can do patient assistance program.  Refer to local clinics including community health and wellness and the free clinic at [facility].  We looked up good Rx for low cost inhalers but was unable to find anything affordable.  Patient says she is getting her insurance card soon.  She will call us back if her Advair is not covered and affordable.  Plan  Patient Instructions  Begin Advair 1 puff Twice daily   Call back if your insurance does not come in soon.  Can refer to [employer] [facility] or Free clinic of [place] [phone] ) Also can do patient assistance for  other inhalers (if not able to get Advair) .  Follow up with Dr. Shearon Stalls in 3-4 months and As needed        Allergic rhinitis Continue on current regimen  Morbid obesity with BMI of 60.0-69.9, adult ([place]) BMI is 68.  Healthy weight loss discussed.     Rexene Edison, NP 04/18/2020

## 2020-04-18 NOTE — Assessment & Plan Note (Addendum)
Spirometry shows some mid flow obstruction and restriction.  Fino was elevated at 27.  Symptoms seem consistent with mild persistent asthma.  Also suspect that deconditioning and morbid obesity with restrictive lung disease are complaining a role in her breathing difficulties as well. Long discussion with patient as she has no insurance.  We have several options.  Can do patient assistance program.  Refer to local clinics including community health and wellness and the free clinic at [facility].  We looked up good Rx for low cost inhalers but was unable to find anything affordable.  Patient says she is getting her insurance card soon.  She will call us back if her Advair is not covered and affordable.  Plan  Patient Instructions  Begin Advair 1 puff Twice daily   Call back if your insurance does not come in soon.  Can refer to [facility] [facility] or Free clinic of [place] [phone] ) Also can do patient assistance for other inhalers (if not able to get Advair) .  Follow up with Dr. Shearon Stalls in 3-4 months and As needed

## 2020-04-23 ENCOUNTER — Other Ambulatory Visit: Payer: Self-pay

## 2020-04-23 ENCOUNTER — Telehealth (INDEPENDENT_AMBULATORY_CARE_PROVIDER_SITE_OTHER): Payer: Self-pay | Admitting: Psychiatry

## 2020-04-23 ENCOUNTER — Encounter (HOSPITAL_COMMUNITY): Payer: Self-pay | Admitting: Psychiatry

## 2020-04-23 DIAGNOSIS — F331 Major depressive disorder, recurrent, moderate: Secondary | ICD-10-CM

## 2020-04-23 MED ORDER — ESCITALOPRAM OXALATE 10 MG PO TABS: ORAL_TABLET | ORAL | 1 refills | Status: DC

## 2020-04-23 NOTE — Patient Instructions (Signed)
1. Start lexapro 5 mg daily for one week, then 10 mg daily  2. Next appointment: 7/27 at 3:20

## 2020-05-28 NOTE — Progress Notes (Signed)
Virtual Visit via Video Note  I connected with Anita Hodges on 06/03/20 at  3:20 PM EDT by a video enabled telemedicine application and verified that I am speaking with the correct person using two identifiers.   I discussed the limitations of evaluation and management by telemedicine and the availability of in person appointments. The patient expressed understanding and agreed to proceed.    I discussed the assessment and treatment plan with the patient. The patient was provided an opportunity to ask questions and all were answered. The patient agreed with the plan and demonstrated an understanding of the instructions.   The patient was advised to call back or seek an in-person evaluation if the symptoms worsen or if the condition fails to improve as anticipated.  Location: patient- home, provider- home office   I provided 18 minutes of non-face-to-face time during this encounter.   Anita Clay, MD    [facility] MD/PA/NP OP Progress Note  06/03/2020 3:48 PM Anita Hodges  MRN:  [id number]  Chief Complaint:  Chief Complaint    Depression; Follow-up; Anxiety     HPI:  This is a follow-up appointment for depression and anxiety.  She states that there has been no change since the last visit.  She stays in the bed most of the time.  However on further inquiry, she would do and like.  She had to sit there as she felt hot.  She states that it made her realize how much she cannot do.  She is also concerned about unemployment.  Although she is on long-term disability, she is behind the bills.  She does not have any other person to support her financially.  She will have angiogram for intracranial hypertension tomorrow. She may have shunt or stent depending on the result.  She feels anxious about this.  She reports good relationship with her boyfriend, who is asleep in the bed next to her.  She has hypersomnia.  She feels fatigue.  She has difficulty in concentration, which she attributes to  intracranial hypertension.  She has fair appetite.  She has anhedonia.  She denies SI.  She feels anxious all the time.  She has occasional panic attacks. She denies any side effect from lexapro.    Visit Diagnosis:    ICD-10-CM   1. MDD (major depressive disorder), recurrent episode, moderate ([place])  F33.1     Past Psychiatric History: Please see initial evaluation for full details. I have reviewed the history. No updates at this time.     Past Medical History:  Past Medical History:  Diagnosis Date  . Allergy   . Anxiety   . Hypertension   . Obesity     Past Surgical History:  Procedure Laterality Date  . CHOLECYSTECTOMY N/A 12/27/2018   Procedure: LAPAROSCOPIC CHOLECYSTECTOMY;  Surgeon: Aviva Signs, MD;  Location: [facility] ORS;  Service: General;  Laterality: N/A;  pt knows to arrive at 6:15  . COLONOSCOPY WITH PROPOFOL N/A 07/23/2019   Procedure: COLONOSCOPY WITH PROPOFOL;  Surgeon: Daneil Dolin, MD;  Location: [facility] ENDO SUITE;  Service: Endoscopy;  Laterality: N/A;  1:15pm  . ESOPHAGOGASTRODUODENOSCOPY (EGD) WITH PROPOFOL N/A 12/18/2018   mild erosive reflux esophagitis, normal stomach, normal duodenum.   Marland Kitchen POLYPECTOMY  07/23/2019   Procedure: POLYPECTOMY;  Surgeon: Daneil Dolin, MD;  Location: [facility] END[facility] SU[facility];  Service: Endoscopy;;  . TONSILLECTOMY    . WISDOM TOOTH EXTRACTION      Family Psychiatric History: Please see initial evaluation for full  details. I have reviewed the history. No updates at this time.     Family History:  Family History  Problem Relation Age of Onset  . Hypertension Mother   . Colon polyps Mother        unknown age of onset  . Gallbladder disease Mother   . Diabetes Father   . Gallbladder disease Father   . COPD Father   . Cancer Maternal Grandfather        ?  Marland Kitchen Cancer Paternal Grandmother        ?  . Colon cancer Neg Hx     Social History:  Social History   Socioeconomic History  . Marital status: Single    Spouse name: Not on file   . Number of children: Not on file  . Years of education: Not on file  . Highest education level: Not on file  Occupational History  . Not on file  Tobacco Use  . Smoking status: Never Smoker  . Smokeless tobacco: Never Used  Vaping Use  . Vaping Use: Never used  Substance and Sexual Activity  . Alcohol use: No    Alcohol/week: 0.0 standard drinks  . Drug use: No  . Sexual activity: Yes    Partners: Male    Birth control/protection: I.U.D.    Comment: 1st intercourse- 34, partners- 43, current partner- 4 months   Other Topics Concern  . Not on file  Social History Narrative   No children.      Social Determinants of Health   Financial Resource Strain:   . Difficulty of Paying Living Expenses:   Food Insecurity:   . Worried About Charity fundraiser in the Last Year:   . Arboriculturist in the Last Year:   Transportation Needs:   . Film/video editor (Medical):   Marland Kitchen Lack of Transportation (Non-Medical):   Physical Activity:   . Days of Exercise per Week:   . Minutes of Exercise per Session:   Stress:   . Feeling of Stress :   Social Connections:   . Frequency of Communication with Friends and Family:   . Frequency of Social Gatherings with Friends and Family:   . Attends Religious Services:   . Active Member of Clubs or Organizations:   . Attends Archivist Meetings:   Marland Kitchen Marital Status:     Allergies:  Allergies  Allergen Reactions  . Codeine Other (See Comments)    DIZZINESS AND COLD (SICK)    Metabolic Disorder Labs: Lab Results  Component Value Date   HGBA1C 5.8 (H) 01/09/2020   MPG 120 01/09/2020   MPG 114 01/02/2013   Lab Results  Component Value Date   PROLACTIN 14.0 02/06/2015   Lab Results  Component Value Date   CHOL 145 05/26/2016   TRIG 77 05/26/2016   HDL 35 (L) 05/26/2016   CHOLHDL 4.1 05/26/2016   VLDL 15 05/26/2016   LDLCALC 95 05/26/2016   LDLCALC 89 01/20/2015   Lab Results  Component Value Date   TSH 2.78  01/09/2020   TSH 1.69 05/26/2016    Therapeutic Level Labs: No results found for: LITHIUM No results found for: VALPROATE No components found for:  CBMZ  Current Medications: Current Outpatient Medications  Medication Sig Dispense Refill  . acetaZOLAMIDE (DIAMOX) 125 MG tablet Take 125 mg by mouth daily.    Marland Kitchen albuterol (PROVENTIL) (2.5 MG/3ML) 0.083% nebulizer solution Take 3 mLs (2.5 mg total) by nebulization every 6 (six) hours  as needed for wheezing or shortness of breath. 75 mL 2  . albuterol (VENTOLIN HFA) 108 (90 Base) MCG/ACT inhaler Inhale 1-2 puffs into the lungs every 6 (six) hours as needed for wheezing or shortness of breath. 18 g 0  . amLODipine (NORVASC) 10 MG tablet Take 10 mg by mouth daily.    Marland Kitchen atenolol (TENORMIN) 50 MG tablet Take 50 mg by mouth daily.    . benzonatate (TESSALON) 100 MG capsule Take 1 capsule (100 mg total) by mouth every 8 (eight) hours. 21 capsule 0  . budesonide-formoterol (SYMBICORT) 160-4.5 MCG/ACT inhaler Inhale 2 puffs into the lungs 2 (two) times daily.    Marland Kitchen escitalopram (LEXAPRO) 20 MG tablet Take 1 tablet (20 mg total) by mouth daily. 30 tablet 1  . fluticasone (FLONASE) 50 MCG/ACT nasal spray Place 1 spray into both nostrils daily for 14 days. 16 g 0  . Fluticasone-Salmeterol (ADVAIR DISKUS) 250-50 MCG/DOSE AEPB Inhale 1 puff into the lungs in the morning and at bedtime. 60 each 5  . Galcanezumab-gnlm (EMGALITY) 120 MG/ML SOSY Inject into the skin.    Marland Kitchen levocetirizine (XYZAL) 5 MG tablet Take 5 mg by mouth every morning.     . montelukast (SINGULAIR) 10 MG tablet Take 10 mg by mouth at bedtime.    . ondansetron (ZOFRAN) 4 MG tablet Take 1 tablet (4 mg total) by mouth every 8 (eight) hours as needed for nausea or vomiting. 60 tablet 3  . promethazine (PHENERGAN) 25 MG tablet Take 0.5 tablets (12.5 mg total) by mouth every 6 (six) hours as needed for nausea or vomiting. 30 tablet 0  . topiramate (TOPAMAX) 100 MG tablet Take 100 mg by mouth 2  (two) times daily.     No current facility-administered medications for this visit.     Musculoskeletal: Strength & Muscle Tone: N/A Gait & Station: N/A Patient leans: N/A  Psychiatric Specialty Exam: Review of Systems  Psychiatric/Behavioral: Positive for decreased concentration, dysphoric mood and sleep disturbance. Negative for agitation, behavioral problems, confusion, hallucinations, self-injury and suicidal ideas. The patient is nervous/anxious. The patient is not hyperactive.   All other systems reviewed and are negative.   There were no vitals taken for this visit.There is no height or weight on file to calculate BMI.  General Appearance: Fairly Groomed  Eye Contact:  Good  Speech:  Clear and Coherent  Volume:  Normal  Mood:  Depressed  Affect:  Appropriate, Congruent and Tearful  Thought Process:  Coherent  Orientation:  Full (Time, Place, and Person)  Thought Content: Logical   Suicidal Thoughts:  No  Homicidal Thoughts:  No  Memory:  Immediate;   Good  Judgement:  Good  Insight:  Good  Psychomotor Activity:  Normal  Concentration:  Concentration: Good and Attention Span: Good  Recall:  Good  Fund of Knowledge: Good  Language: Good  Akathisia:  No  Handed:  Right  AIMS (if indicated): not done  Assets:  Communication Skills Desire for Improvement  ADL's:  Intact  Cognition: WNL  Sleep:  Poor   Screenings: PHQ2-9     Office Visit from 12/29/2017 in Primary Care at [facility] from 04/04/2014 in Primary Care at [facility]  PHQ-2 Total Score 0 0       Assessment and Plan:  Anita Hodges is a 34 y.o. year old female with a history of  anxiety,idiopathic intracranialhypertension, hypertension, obesity, who presents for follow up appointment for below.   1. MDD (major depressive disorder), recurrent  episode, moderate ([place]) She continues to report depressive symptoms with prominent fatigue in the context of demoralization from intracranial  hypertension unemployment.  Will uptitrate Lexapro to optimize benefit for depression.  Coached behavioral activation and self compassion.  She will greatly benefit from CBT; she is encouraged to continue to see her therapist.   Plan 1. Increase lexapro 20 mg daily - she is advised to make this change after she completed the procedure without adverse event for intracranial hypertension.  2. Next appointment: 8/30 at 4 PM for 30 mins, video - She sees a therapist at [facility] in [place] - on Topamax  The patient demonstrates the following risk factors for suicide: Chronic risk factors for suicide include:psychiatric disorder ofdepression. Acute risk factorsfor suicide include: unemployment and loss (financial, interpersonal, professional). Protective factorsfor this patient include: positive social support. Considering these factors, the overall suicide risk at this point appears to below. Patientisappropriate for outpatient follow up.  Anita Clay, MD 06/03/2020, 3:48 PM

## 2020-06-03 ENCOUNTER — Encounter (HOSPITAL_COMMUNITY): Payer: Self-pay | Admitting: Psychiatry

## 2020-06-03 ENCOUNTER — Other Ambulatory Visit: Payer: Self-pay

## 2020-06-03 ENCOUNTER — Telehealth (INDEPENDENT_AMBULATORY_CARE_PROVIDER_SITE_OTHER): Payer: 59 | Admitting: Psychiatry

## 2020-06-03 DIAGNOSIS — F331 Major depressive disorder, recurrent, moderate: Secondary | ICD-10-CM

## 2020-06-03 MED ORDER — ESCITALOPRAM OXALATE 20 MG PO TABS: 20.0000 mg | ORAL_TABLET | Freq: Every day | ORAL | 1 refills | Status: DC

## 2020-06-03 NOTE — Patient Instructions (Signed)
1. Increase lexapro 20 mg daily  2. Next appointment: 8/30 at 4 PM

## 2020-07-02 NOTE — Progress Notes (Signed)
Virtual Visit via Video Note  I connected with Anita Hodges on 07/07/20 at  4:00 PM EDT by a video enabled telemedicine application and verified that I am speaking with the correct person using two identifiers.   I discussed the limitations of evaluation and management by telemedicine and the availability of in person appointments. The patient expressed understanding and agreed to proceed.     I discussed the assessment and treatment plan with the patient. The patient was provided an opportunity to ask questions and all were answered. The patient agreed with the plan and demonstrated an understanding of the instructions.   The patient was advised to call back or seek an in-person evaluation if the symptoms worsen or if the condition fails to improve as anticipated.  Location: patient- home, provider- office   I provided 15 minutes of non-face-to-face time during this encounter.   Norman Clay, MD    [facility] MD/PA/NP OP Progress Note  07/07/2020 4:33 PM Anita Hodges  MRN:  [id number]  Chief Complaint:  Chief Complaint    Follow-up; Depression     HPI:  This is a follow-up appointment for depression.  She states that she feels the same.  She lies in the bed most of the time, or watches TV. However on further inquiry, she was able to go outside with her boyfriend. She did not like it as much and she wanted to stay inside. She agrees to try taking a walk for a few minutes every day. She reports good relationship with her boyfriend, who visits the patient a few times per week. Although the decision was made for her to have procedure for stent, it was cancelled due to pandemic.  She states that part of her felt relieved by this as she was anxious about the procedure.  She has not been able to see her therapist due to financial strain.  She has hypersomnia, although she is more awake during the day since up titration of Lexapro.  She has anhedonia.  She has difficulty in concentration.  She  has fair appetite.  She denies SI.  She feels anxious and tense.   Visit Diagnosis:    ICD-10-CM   1. MDD (major depressive disorder), recurrent episode, moderate ([place])  F33.1     Past Psychiatric History: Please see initial evaluation for full details. I have reviewed the history. No updates at this time.     Past Medical History:  Past Medical History:  Diagnosis Date  . Allergy   . Anxiety   . Hypertension   . Obesity     Past Surgical History:  Procedure Laterality Date  . CHOLECYSTECTOMY N/A 12/27/2018   Procedure: LAPAROSCOPIC CHOLECYSTECTOMY;  Surgeon: Aviva Signs, MD;  Location: [facility];  Service: General;  Laterality: N/A;  pt knows to arrive at 6:15  . COLONOSCOPY WITH PROPOFOL N/A 07/23/2019   Procedure: COLONOSCOPY WITH PROPOFOL;  Surgeon: Daneil Dolin, MD;  Location: [facility] END[facility];  Service: Endoscopy;  Laterality: N/A;  1:15pm  . ESOPHAGOGASTRODUODENOSCOPY (EGD) WITH PROPOFOL N/A 12/18/2018   mild erosive reflux esophagitis, normal stomach, normal duodenum.   Marland Kitchen POLYPECTOMY  07/23/2019   Procedure: POLYPECTOMY;  Surgeon: Daneil Dolin, MD;  Location: [facility];  Service: Endoscopy;;  . TONSILLECTOMY    . WISDOM TOOTH EXTRACTION      Family Psychiatric History: Please see initial evaluation for full details. I have reviewed the history. No updates at this time.     Family History:  Family History  Problem Relation Age of Onset  . Hypertension Mother   . Colon polyps Mother        unknown age of onset  . Gallbladder disease Mother   . Diabetes Father   . Gallbladder disease Father   . COPD Father   . Cancer Maternal Grandfather        ?  Marland Kitchen Cancer Paternal Grandmother        ?  . Colon cancer Neg Hx     Social History:  Social History   Socioeconomic History  . Marital status: Single    Spouse name: Not on file  . Number of children: Not on file  . Years of education: Not on file  . Highest education level: Not on file  Occupational  History  . Not on file  Tobacco Use  . Smoking status: Never Smoker  . Smokeless tobacco: Never Used  Vaping Use  . Vaping Use: Never used  Substance and Sexual Activity  . Alcohol use: No    Alcohol/week: 0.0 standard drinks  . Drug use: No  . Sexual activity: Yes    Partners: Male    Birth control/protection: I.U.D.    Comment: 1st intercourse- 26, partners- 6, current partner- 4 months   Other Topics Concern  . Not on file  Social History Narrative   No children.      Social Determinants of Health   Financial Resource Strain:   . Difficulty of Paying Living Expenses: Not on file  Food Insecurity:   . Worried About Charity fundraiser in the Last Year: Not on file  . Ran Out of Food in the Last Year: Not on file  Transportation Needs:   . Lack of Transportation (Medical): Not on file  . Lack of Transportation (Non-Medical): Not on file  Physical Activity:   . Days of Exercise per Week: Not on file  . Minutes of Exercise per Session: Not on file  Stress:   . Feeling of Stress : Not on file  Social Connections:   . Frequency of Communication with Friends and Family: Not on file  . Frequency of Social Gatherings with Friends and Family: Not on file  . Attends Religious Services: Not on file  . Active Member of Clubs or Organizations: Not on file  . Attends Archivist Meetings: Not on file  . Marital Status: Not on file    Allergies:  Allergies  Allergen Reactions  . Codeine Other (See Comments)    DIZZINESS AND COLD (SICK)    Metabolic Disorder Labs: Lab Results  Component Value Date   HGBA1C 5.8 (H) 01/09/2020   MPG 120 01/09/2020   MPG 114 01/02/2013   Lab Results  Component Value Date   PROLACTIN 14.0 02/06/2015   Lab Results  Component Value Date   CHOL 145 05/26/2016   TRIG 77 05/26/2016   HDL 35 (L) 05/26/2016   CHOLHDL 4.1 05/26/2016   VLDL 15 05/26/2016   LDLCALC 95 05/26/2016   LDLCALC 89 01/20/2015   Lab Results  Component  Value Date   TSH 2.78 01/09/2020   TSH 1.69 05/26/2016    Therapeutic Level Labs: No results found for: LITHIUM No results found for: VALPROATE No components found for:  CBMZ  Current Medications: Current Outpatient Medications  Medication Sig Dispense Refill  . acetaZOLAMIDE (DIAMOX) 125 MG tablet Take 125 mg by mouth daily.    Marland Kitchen albuterol (PROVENTIL) (2.5 MG/3ML) 0.083% nebulizer solution Take 3  mLs (2.5 mg total) by nebulization every 6 (six) hours as needed for wheezing or shortness of breath. 75 mL 2  . albuterol (VENTOLIN HFA) 108 (90 Base) MCG/ACT inhaler Inhale 1-2 puffs into the lungs every 6 (six) hours as needed for wheezing or shortness of breath. 18 g 0  . amLODipine (NORVASC) 10 MG tablet Take 10 mg by mouth daily.    . ARIPiprazole (ABILIFY) 2 MG tablet Take 1 tablet (2 mg total) by mouth daily. 30 tablet 1  . atenolol (TENORMIN) 50 MG tablet Take 50 mg by mouth daily.    . benzonatate (TESSALON) 100 MG capsule Take 1 capsule (100 mg total) by mouth every 8 (eight) hours. 21 capsule 0  . budesonide-formoterol (SYMBICORT) 160-4.5 MCG/ACT inhaler Inhale 2 puffs into the lungs 2 (two) times daily.    Derrill Memo ON 08/04/2020] escitalopram (LEXAPRO) 20 MG tablet Take 1 tablet (20 mg total) by mouth daily. 30 tablet 0  . fluticasone (FLONASE) 50 MCG/ACT nasal spray Place 1 spray into both nostrils daily for 14 days. 16 g 0  . Fluticasone-Salmeterol (ADVAIR DISKUS) 250-50 MCG/DOSE AEPB Inhale 1 puff into the lungs in the morning and at bedtime. 60 each 5  . Galcanezumab-gnlm (EMGALITY) 120 MG/ML SOSY Inject into the skin.    Marland Kitchen levocetirizine (XYZAL) 5 MG tablet Take 5 mg by mouth every morning.     . montelukast (SINGULAIR) 10 MG tablet Take 10 mg by mouth at bedtime.    . ondansetron (ZOFRAN) 4 MG tablet Take 1 tablet (4 mg total) by mouth every 8 (eight) hours as needed for nausea or vomiting. 60 tablet 3  . promethazine (PHENERGAN) 25 MG tablet Take 0.5 tablets (12.5 mg total) by  mouth every 6 (six) hours as needed for nausea or vomiting. 30 tablet 0  . topiramate (TOPAMAX) 100 MG tablet Take 100 mg by mouth 2 (two) times daily.     No current facility-administered medications for this visit.     Musculoskeletal: Strength & Muscle Tone: N/A Gait & Station: N/A Patient leans: N/A  Psychiatric Specialty Exam: Review of Systems  Psychiatric/Behavioral: Positive for decreased concentration, dysphoric mood and sleep disturbance. Negative for agitation, behavioral problems, confusion, hallucinations, self-injury and suicidal ideas. The patient is nervous/anxious. The patient is not hyperactive.   All other systems reviewed and are negative.   There were no vitals taken for this visit.There is no height or weight on file to calculate BMI.  General Appearance: Fairly Groomed  Eye Contact:  Good  Speech:  Clear and Coherent  Volume:  Normal  Mood:  Depressed  Affect:  Appropriate, Congruent, Restricted and down  Thought Process:  Coherent  Orientation:  Full (Time, Place, and Person)  Thought Content: Logical   Suicidal Thoughts:  No  Homicidal Thoughts:  No  Memory:  Immediate;   Good  Judgement:  Good  Insight:  Fair  Psychomotor Activity:  Normal  Concentration:  Concentration: Good and Attention Span: Good  Recall:  Good  Fund of Knowledge: Good  Language: Good  Akathisia:  No  Handed:  Right  AIMS (if indicated): not done  Assets:  Communication Skills Desire for Improvement  ADL's:  Intact  Cognition: WNL  Sleep:  Fair   Screenings: PHQ2-9     Office Visit from 12/29/2017 in Primary Care at [place] from 04/04/2014 in Primary Care at [facility] Total Score 0 0       Assessment and Plan:  Anita Pope  Hodges is a 34 y.o. year old female with a history of anxiety,idiopathic intracranialhypertension, hypertension, obesity, who presents for follow up appointment for below.   1. MDD (major depressive disorder), recurrent episode,  moderate ([place]) She continues to report depressive symptoms with prominent fatigue, although there is slight improvement since up titration of Lexapro.  Psychosocial stressors includes demoralization from intracranial hypertension, and unemployment.  Will add Abilify as adjunctive treatment for depression.  Discussed potential metabolic side effect and EPS.  Will continue Lexapro to target depression.  Although she will greatly benefit from CBT, she is unable to see a therapist due to financial strain.  Coached behavioral activation.   Plan 1. Continue lexapro 20 mg daily 2. Start Abilify 2 mg daily  3. Next appointment: 10/11 at 4 PM for 30 mins, video  - She sees a therapist at [facility] in [place] - on Topamax  The patient demonstrates the following risk factors for suicide: Chronic risk factors for suicide include:psychiatric disorder ofdepression. Acute risk factorsfor suicide include: unemployment and loss (financial, interpersonal, professional). Protective factorsfor this patient include: positive social support. Considering these factors, the overall suicide risk at this point appears to below. Patientisappropriate for outpatient follow up.   Norman Clay, MD 07/07/2020, 4:33 PM

## 2020-07-07 ENCOUNTER — Telehealth (INDEPENDENT_AMBULATORY_CARE_PROVIDER_SITE_OTHER): Payer: 59 | Admitting: Psychiatry

## 2020-07-07 ENCOUNTER — Other Ambulatory Visit: Payer: Self-pay

## 2020-07-07 ENCOUNTER — Encounter (HOSPITAL_COMMUNITY): Payer: Self-pay | Admitting: Psychiatry

## 2020-07-07 DIAGNOSIS — F331 Major depressive disorder, recurrent, moderate: Secondary | ICD-10-CM

## 2020-07-07 MED ORDER — ESCITALOPRAM OXALATE 20 MG PO TABS: 20.0000 mg | ORAL_TABLET | Freq: Every day | ORAL | 0 refills | Status: DC

## 2020-07-07 MED ORDER — ARIPIPRAZOLE 2 MG PO TABS: 2.0000 mg | ORAL_TABLET | Freq: Every day | ORAL | 1 refills | Status: DC

## 2020-07-07 NOTE — Patient Instructions (Signed)
1. Continue lexapro 20 mg daily 2. Start Abilify 2 mg daily  3. Next appointment: 10/11 at 4 PM

## 2020-08-13 NOTE — Progress Notes (Signed)
Virtual Visit via Video Note  I connected with Anita Hodges on 08/18/20 at  4:00 PM EDT by a video enabled telemedicine application and verified that I am speaking with the correct person using two identifiers.   I discussed the limitations of evaluation and management by telemedicine and the availability of in person appointments. The patient expressed understanding and agreed to proceed.   I discussed the assessment and treatment plan with the patient. The patient was provided an opportunity to ask questions and all were answered. The patient agreed with the plan and demonstrated an understanding of the instructions.   The patient was advised to call back or seek an in-person evaluation if the symptoms worsen or if the condition fails to improve as anticipated.  Location: patient- home, provider- office   I provided 21 minutes of non-face-to-face time during this encounter.   Norman Clay, MD    [facility] MD/PA/NP OP Progress Note  08/18/2020 4:29 PM Anita Hodges  MRN:  [id number]  Chief Complaint:  Chief Complaint    Follow-up; Depression     HPI:  This is a follow-up appointment for depression.  She states that she feels that universities against her while she is trying.  She states that she to have the car towed to dealership for repair yesterday.  She states that she felt her father was blaming her when she shared this event. She also talks about conflict with her boyfriend.  She states that he turned the music off during the drive while she was singing along the song. She felt hurt by this, although he apologized about it later. She states that she is not sure if she is in a good condition to make a decision about the relationship.  She continues to struggle with headache.  She is scared of surgery, and she does not want to take a chance. She believes that she has been doing more things/house chores since starting Abilify, although she feels the same now.  She is interested in up  titration of medication.  She has insomnia; she states 4 AM and wakes up at 11 AM.  She feels fatigued.  She has difficulty in concentration.  She has fair appetite.  Although she does not measure her weight, she does not think she is gaining weight.  She has mild anhedonia.  She denies SI.    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 MDD (major depressive disorder), recurrent episode, moderate (HCC)  F33.1 escitalopram (LEXAPRO) 20 MG tablet    Past Psychiatric History: Please see initial evaluation for full details. I have reviewed the history. No updates at this time.     Past Medical History:  Past Medical History:  Diagnosis Date  . Allergy   . Anxiety   . Hypertension   . Obesity     Past Surgical History:  Procedure Laterality Date  . CHOLECYSTECTOMY N/A 12/27/2018   Procedure: LAPAROSCOPIC CHOLECYSTECTOMY;  Surgeon: Aviva Signs, MD;  Location: [facility] ORS;  Service: General;  Laterality: N/A;  pt knows to arrive at 6:15  . COLONOSCOPY WITH PROPOFOL N/A 07/23/2019   Procedure: COLONOSCOPY WITH PROPOFOL;  Surgeon: Daneil Dolin, MD;  Location: [facility];  Service: Endoscopy;  Laterality: N/A;  1:15pm  . ESOPHAGOGASTRODUODENOSCOPY (EGD) WITH PROPOFOL N/A 12/18/2018   mild erosive reflux esophagitis, normal stomach, normal duodenum.   Marland Kitchen POLYPECTOMY  07/23/2019   Procedure: POLYPECTOMY;  Surgeon: Daneil Dolin, MD;  Location[facility];  Service: Endoscopy;;  . TONSILLECTOMY    . WISDOM TOOTH EXTRACTION      Family Psychiatric History: Please see initial evaluation for full details. I have reviewed the history. No updates at this time.     Family History:  Family History  Problem Relation  Age of Onset  . Hypertension Mother   . Colon polyps Mother        unknown age of onset  . Gallbladder disease Mother   . Diabetes Father   . Gallbladder disease Father   . COPD Father   . Cancer Maternal Grandfather        ?  Marland Kitchen Cancer Paternal Grandmother        ?  . Colon cancer Neg Hx     Social History:  Social History   Socioeconomic History  . Marital status: Single    Spouse name: Not on file  . Number of children: Not on file  . Years of education: Not on file  . Highest education level: Not on file  Occupational History  . Not on file  Tobacco Use  . Smoking status: Never Smoker  . Smokeless tobacco: Never Used  Vaping Use  . Vaping Use: Never used  Substance and Sexual Activity  . Alcohol use: No    Alcohol/week: 0.0 standard drinks  . Drug use: No  . Sexual activity: Yes    Partners: Male    Birth control/protection: I.U.D.    Comment: 1st intercourse- 9, partners- 76, current partner- 4 months   Other Topics Concern  . Not on file  Social History Narrative   No children.      Social Determinants of Health   Financial Resource Strain:   . Difficulty of Paying Living Expenses: Not on file  Food Insecurity:   . Worried About Charity fundraiser in the Last Year: Not on file  . Ran Out of Food in the Last Year: Not on file  Transportation Needs:   . Lack of Transportation (Medical): Not on file  . Lack of Transportation (Non-Medical): Not on file  Physical Activity:   . Days of Exercise per Week: Not on file  . Minutes of Exercise per Session: Not on file  Stress:   . Feeling of Stress : Not on file  Social Connections:   . Frequency of Communication with Friends and Family: Not on file  . Frequency of Social Gatherings with Friends and Family: Not on file  . Attends Religious Services: Not on file  . Active Member of Clubs or Organizations: Not on file  . Attends Archivist Meetings: Not on file  . Marital Status: Not on file     Allergies:  Allergies  Allergen Reactions  . Codeine Other (See Comments)    DIZZINESS AND COLD (SICK)    Metabolic Disorder Labs: Lab Results  Component Value Date   HGBA1C 5.8 (H) 01/09/2020   MPG 120 01/09/2020   MPG 114 01/02/2013   Lab Results  Component Value Date   PROLACTIN 14.0 02/06/2015   Lab Results  Component Value Date   CHOL 145 05/26/2016   TRIG 77 05/26/2016   HDL 35 (L) 05/26/2016   CHOLHDL 4.1 05/26/2016  VLDL 15 05/26/2016   LDLCALC 95 05/26/2016   LDLCALC 89 01/20/2015   Lab Results  Component Value Date   TSH 2.78 01/09/2020   TSH 1.69 05/26/2016    Therapeutic Level Labs: No results found for: LITHIUM No results found for: VALPROATE No components found for:  CBMZ  Current Medications: Current Outpatient Medications  Medication Sig Dispense Refill  . acetaZOLAMIDE (DIAMOX) 125 MG tablet Take 125 mg by mouth daily.    Marland Kitchen albuterol (PROVENTIL) (2.5 MG/3ML) 0.083% nebulizer solution Take 3 mLs (2.5 mg total) by nebulization every 6 (six) hours as needed for wheezing or shortness of breath. 75 mL 2  . albuterol (VENTOLIN HFA) 108 (90 Base) MCG/ACT inhaler Inhale 1-2 puffs into the lungs every 6 (six) hours as needed for wheezing or shortness of breath. 18 g 0  . amLODipine (NORVASC) 10 MG tablet Take 10 mg by mouth daily.    . ARIPiprazole 5 MG TABS Take 5 mg by mouth daily. 30 tablet 1  . atenolol (TENORMIN) 50 MG tablet Take 50 mg by mouth daily.    . benzonatate (TESSALON) 100 MG capsule Take 1 capsule (100 mg total) by mouth every 8 (eight) hours. 21 capsule 0  . budesonide-formoterol (SYMBICORT) 160-4.5 MCG/ACT inhaler Inhale 2 puffs into the lungs 2 (two) times daily.    Marland Kitchen escitalopram (LEXAPRO) 20 MG tablet Take 1 tablet (20 mg total) by mouth daily. 30 tablet 1  . fluticasone (FLONASE) 50 MCG/ACT nasal spray Place 1 spray into both nostrils daily for 14 days. 16 g 0  . Fluticasone-Salmeterol (ADVAIR DISKUS) 250-50 MCG/DOSE AEPB  Inhale 1 puff into the lungs in the morning and at bedtime. 60 each 5  . Galcanezumab-gnlm (EMGALITY) 120 MG/ML SOSY Inject into the skin.    Marland Kitchen levocetirizine (XYZAL) 5 MG tablet Take 5 mg by mouth every morning.     . montelukast (SINGULAIR) 10 MG tablet Take 10 mg by mouth at bedtime.    . ondansetron (ZOFRAN) 4 MG tablet Take 1 tablet (4 mg total) by mouth every 8 (eight) hours as needed for nausea or vomiting. 60 tablet 3  . promethazine (PHENERGAN) 25 MG tablet Take 0.5 tablets (12.5 mg total) by mouth every 6 (six) hours as needed for nausea or vomiting. 30 tablet 0  . topiramate (TOPAMAX) 100 MG tablet Take 100 mg by mouth 2 (two) times daily.     No current facility-administered medications for this visit.     Musculoskeletal: Strength & Muscle Tone: N/A Gait & Station: N/A Patient leans: N/A  Psychiatric Specialty Exam: Review of Systems  Psychiatric/Behavioral: Positive for decreased concentration, dysphoric mood and sleep disturbance. Negative for agitation, behavioral problems, confusion, hallucinations, self-injury and suicidal ideas. The patient is not nervous/anxious and is not hyperactive.   All other systems reviewed and are negative.   There were no vitals taken for this visit.There is no height or weight on file to calculate BMI.  General Appearance: Fairly Groomed  Eye Contact:  Good  Speech:  Clear and Coherent  Volume:  Normal  Mood:  "not good"  Affect:  Appropriate, Congruent, Restricted and Tearful  Thought Process:  Coherent  Orientation:  Full (Time, Place, and Person)  Thought Content: Logical   Suicidal Thoughts:  No  Homicidal Thoughts:  No  Memory:  Immediate;   Good  Judgement:  Good  Insight:  Fair  Psychomotor Activity:  Normal  Concentration:  Concentration: Good and Attention Span: Good  Recall:  Good  Fund of Knowledge: Good  Language: Good  Akathisia:  No  Handed:  Right  AIMS (if indicated): not done  Assets:  Communication  Skills Desire for Improvement  ADL's:  Intact  Cognition: WNL  Sleep:  Poor   Screenings: PHQ2-9     Office Visit from 12/29/2017 in Primary Care at [place] from 04/04/2014 in Primary Care at [facility]  PHQ-2 Total Score 0 0       Assessment and Plan:  Anita Hodges is a 34 y.o. year old female with a history of anxiety,idiopathic intracranialhypertension, hypertension, obesity, who presents for follow up appointment for below.   1. MDD (major depressive disorder), recurrent episode, moderate ([place]) Although she continues to report depressive symptoms with prominent fatigue, there has been overall improvement since starting Abilify.  Psychosocial stressors includes demoralization from intracranial hypertension, unemployment, and conflict with her boyfriend and her parents.  We uptitrate Abilify to optimize treatment for depression.  Discussed potential metabolic side effect and EPS.  Will continue Lexapro to target depression.  Coached behavioral activation.    Plan I have reviewed and updated plans as below 1.Continue lexapro 20 mg daily 2. Increase Abilify 5 mg daily  - Try melatonin 3 mg at night 2 hours before going to sleep 3. Next appointment: 11/22 at 1:40 for 20 mins, video  - She sees a therapist at [facility] in [place] (on hold due to financial strain) - on Topamax  The patient demonstrates the following risk factors for suicide: Chronic risk factors for suicide include:psychiatric disorder ofdepression. Acute risk factorsfor suicide include: unemployment and loss (financial, interpersonal, professional). Protective factorsfor this patient include: positive social support. Considering these factors, the overall suicide risk at this point appears to below. Patientisappropriate for outpatient follow up.     Norman Clay, MD 08/18/2020, 4:29 PM

## 2020-08-18 ENCOUNTER — Telehealth (INDEPENDENT_AMBULATORY_CARE_PROVIDER_SITE_OTHER): Payer: 59 | Admitting: Psychiatry

## 2020-08-18 ENCOUNTER — Encounter (HOSPITAL_COMMUNITY): Payer: Self-pay | Admitting: Psychiatry

## 2020-08-18 ENCOUNTER — Other Ambulatory Visit: Payer: Self-pay

## 2020-08-18 DIAGNOSIS — F331 Major depressive disorder, recurrent, moderate: Secondary | ICD-10-CM

## 2020-08-18 MED ORDER — ARIPIPRAZOLE (SENSOR) 5 MG PO TABS: 5.0000 mg | ORAL_TABLET | Freq: Every day | ORAL | 1 refills | Status: DC

## 2020-08-18 MED ORDER — ESCITALOPRAM OXALATE 20 MG PO TABS: 20.0000 mg | ORAL_TABLET | Freq: Every day | ORAL | 1 refills | Status: DC

## 2020-08-18 NOTE — Patient Instructions (Signed)
1.Continue lexapro 20 mg daily 2. Increase Abilify 5 mg daily  - Try melatonin 3 mg at night 2 hours before going to sleep 3. Next appointment: 11/22 at 1:40

## 2020-09-02 ENCOUNTER — Telehealth (HOSPITAL_COMMUNITY): Payer: Self-pay | Admitting: *Deleted

## 2020-09-02 NOTE — Telephone Encounter (Signed)
Request for Status papers received from [employer] and provider wants staff to get patient consent form completed first. Staff called patient and was not able to reach patient and patient voicemail box is full. Form will be in Misc/FMLA folder until patient returns office call.

## 2020-09-04 ENCOUNTER — Telehealth (HOSPITAL_COMMUNITY): Payer: Self-pay | Admitting: *Deleted

## 2020-09-04 NOTE — Telephone Encounter (Signed)
Office received fax from [place], [place] term Disability. Provider approved for records they are requesting be sent to them.

## 2020-09-22 NOTE — Progress Notes (Signed)
Virtual Visit via Video Note  I connected with Anita Hodges on 09/29/20 at  1:40 PM EST by a video enabled telemedicine application and verified that I am speaking with the correct person using two identifiers.  Location: Patient: home Provider: office Persons participated in the visit- patient, provider   I discussed the limitations of evaluation and management by telemedicine and the availability of in person appointments. The patient expressed understanding and agreed to proceed.    I discussed the assessment and treatment plan with the patient. The patient was provided an opportunity to ask questions and all were answered. The patient agreed with the plan and demonstrated an understanding of the instructions.   The patient was advised to call back or seek an in-person evaluation if the symptoms worsen or if the condition fails to improve as anticipated.  I provided 15 minutes of non-face-to-face time during this encounter.   Norman Clay, MD    [facility] MD/PA/NP OP Progress Note  09/29/2020 2:06 PM Anita Hodges  MRN:  [id number]  Chief Complaint:  Chief Complaint    Follow-up; Depression     HPI:  This is a follow-up appointment for depression.  She states that she has been doing better; she is trying to get around more when she can.  Although she has constant pain especially when she sits up, she has more days that she is motivated to get a job.  She is also scared that [employer] may stop providing disability. She had a job interview.  She was very frustrated as she could not present well about her disability.  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  She reports conflict with her boyfriend, who gives her mixed signals.  He tends to be moody at times.  She sleeps well.  She feels down at times.  She has fair concentration. She feels fatigue. She denies any change in weight or appetite.  She denies anhedonia; she finds spending the day with her  boyfriend to be resting, and she enjoys cooking.  She denies SI.    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 MDD (major depressive disorder), recurrent episode, mild (HCC)  F33.0 escitalopram (LEXAPRO) 20 MG tablet    Past Psychiatric History: Please see initial evaluation for full details. I have reviewed the history. No updates at this time.     Past Medical History:  Past Medical History:  Diagnosis Date  . Allergy   . Anxiety   . Hypertension   . Obesity     Past Surgical History:  Procedure Laterality Date  . CHOLECYSTECTOMY N/A 12/27/2018   Procedure: LAPAROSCOPIC CHOLECYSTECTOMY;  Surgeon: Aviva Signs, MD;  Location: [facility] OR[facility];  Service: General;  Laterality: N/A;  pt knows to arrive at 6:15  . COLONOSCOPY WITH PROPOFOL N/A 07/23/2019   Procedure: COLONOSCOPY WITH PROPOFOL;  Surgeon: Daneil Dolin, MD;  Location: [facility] ENDO SUITE;  Service: Endoscopy;  Laterality: N/A;  1:15pm  . ESOPHAGOGASTRODUODENOSCOPY (EGD) WITH PROPOFOL N/A 12/18/2018   mild erosive reflux esophagitis, normal stomach, normal duodenum.   Marland Kitchen POLYPECTOMY  07/23/2019   Procedure: POLYPECTOMY;  Surgeon: Daneil Dolin, MD;  Location: [facility];  Service: Endoscopy;;  . TONSILLECTOMY    . WISDOM TOOTH EXTRACTION      Family Psychiatric History: Please see initial evaluation for full details. I have reviewed the history. No updates at this time.     Family History:  Family History  Problem Relation Age of Onset  . Hypertension Mother   . Colon polyps Mother        unknown age of onset  . Gallbladder disease Mother   . Diabetes Father   . Gallbladder disease Father   . COPD Father   . Cancer Maternal Grandfather         ?  Marland Kitchen Cancer Paternal Grandmother        ?  . Colon cancer Neg Hx     Social History:  Social History   Socioeconomic History  . Marital status: Single    Spouse name: Not on file  . Number of children: Not on file  . Years of education: Not on file  . Highest education level: Not on file  Occupational History  . Not on file  Tobacco Use  . Smoking status: Never Smoker  . Smokeless tobacco: Never Used  Vaping Use  . Vaping Use: Never used  Substance and Sexual Activity  . Alcohol use: No    Alcohol/week: 0.0 standard drinks  . Drug use: No  . Sexual activity: Yes    Partners: Male    Birth control/protection: I.U.D.    Comment: 1st intercourse- 58, partners- 35, current partner- 4 months   Other Topics Concern  . Not on file  Social History Narrative   No children.      Social Determinants of Health   Financial Resource Strain:   . Difficulty of Paying Living Expenses: Not on file  Food Insecurity:   . Worried About Charity fundraiser in the Last Year: Not on file  . Ran Out of Food in the Last Year: Not on file  Transportation Needs:   . Lack of Transportation (Medical): Not on file  . Lack of Transportation (Non-Medical): Not on file  Physical Activity:   . Days of Exercise per Week: Not on file  . Minutes of Exercise per Session: Not on file  Stress:   . Feeling of Stress : Not on file  Social Connections:   . Frequency of Communication with Friends and Family: Not on file  . Frequency of Social Gatherings with Friends and Family: Not on file  . Attends Religious Services: Not on file  . Active Member of Clubs or Organizations: Not on file  . Attends Archivist Meetings: Not on file  . Marital Status: Not on file    Allergies:  Allergies  Allergen Reactions  . Codeine Other (See Comments)    DIZZINESS AND COLD (SICK)    Metabolic Disorder Labs: Lab Results  Component Value Date   HGBA1C 5.8 (H) 01/09/2020   MPG 120 01/09/2020   MPG  114 01/02/2013   Lab Results  Component Value Date   PROLACTIN 14.0 02/06/2015   Lab Results  Component Value Date   CHOL 145 05/26/2016   TRIG 77 05/26/2016   HDL 35 (L) 05/26/2016   CHOLHDL 4.1 05/26/2016   VLDL 15 05/26/2016   LDLCALC 95 05/26/2016   LDLCALC 89 01/20/2015   Lab Results  Component Value Date   TSH 2.78 01/09/2020   TSH 1.69 05/26/2016    Therapeutic Level Labs: No results found for: LITHIUM No results found for:  VALPROATE No components found for:  CBMZ  Current Medications: Current Outpatient Medications  Medication Sig Dispense Refill  . acetaZOLAMIDE (DIAMOX) 125 MG tablet Take 125 mg by mouth daily.    Marland Kitchen albuterol (PROVENTIL) (2.5 MG/3ML) 0.083% nebulizer solution Take 3 mLs (2.5 mg total) by nebulization every 6 (six) hours as needed for wheezing or shortness of breath. 75 mL 2  . albuterol (VENTOLIN HFA) 108 (90 Base) MCG/ACT inhaler Inhale 1-2 puffs into the lungs every 6 (six) hours as needed for wheezing or shortness of breath. 18 g 0  . amLODipine (NORVASC) 10 MG tablet Take 10 mg by mouth daily.    . ARIPiprazole (ABILIFY) 10 MG tablet Take 1 tablet (10 mg total) by mouth daily. 30 tablet 1  . atenolol (TENORMIN) 50 MG tablet Take 50 mg by mouth daily.    . benzonatate (TESSALON) 100 MG capsule Take 1 capsule (100 mg total) by mouth every 8 (eight) hours. 21 capsule 0  . budesonide-formoterol (SYMBICORT) 160-4.5 MCG/ACT inhaler Inhale 2 puffs into the lungs 2 (two) times daily.    Derrill Memo ON 10/17/2020] escitalopram (LEXAPRO) 20 MG tablet Take 1 tablet (20 mg total) by mouth daily. 30 tablet 1  . fluticasone (FLONASE) 50 MCG/ACT nasal spray Place 1 spray into both nostrils daily for 14 days. 16 g 0  . Fluticasone-Salmeterol (ADVAIR DISKUS) 250-50 MCG/DOSE AEPB Inhale 1 puff into the lungs in the morning and at bedtime. 60 each 5  . Galcanezumab-gnlm (EMGALITY) 120 MG/ML SOSY Inject into the skin.    Marland Kitchen levocetirizine (XYZAL) 5 MG tablet Take 5 mg  by mouth every morning.     . montelukast (SINGULAIR) 10 MG tablet Take 10 mg by mouth at bedtime.    . ondansetron (ZOFRAN) 4 MG tablet Take 1 tablet (4 mg total) by mouth every 8 (eight) hours as needed for nausea or vomiting. 60 tablet 3  . promethazine (PHENERGAN) 25 MG tablet Take 0.5 tablets (12.5 mg total) by mouth every 6 (six) hours as needed for nausea or vomiting. 30 tablet 0  . topiramate (TOPAMAX) 100 MG tablet Take 100 mg by mouth 2 (two) times daily.     No current facility-administered medications for this visit.     Musculoskeletal: Strength & Muscle Tone: N/A Gait & Station: N/A Patient leans: N/A  Psychiatric Specialty Exam: Review of Systems  Psychiatric/Behavioral: Positive for dysphoric mood. Negative for agitation, behavioral problems, confusion, decreased concentration, hallucinations, self-injury, sleep disturbance and suicidal ideas. The patient is nervous/anxious. The patient is not hyperactive.   All other systems reviewed and are negative.   There were no vitals taken for this visit.There is no height or weight on file to calculate BMI.  General Appearance: Fairly Groomed  Eye Contact:  Good  Speech:  Clear and Coherent  Volume:  Normal  Mood:  better  Affect:  Appropriate, Congruent and brighter  Thought Process:  Coherent  Orientation:  Full (Time, Place, and Person)  Thought Content: Logical   Suicidal Thoughts:  No  Homicidal Thoughts:  No  Memory:  Immediate;   Good  Judgement:  Good  Insight:  Good  Psychomotor Activity:  Normal  Concentration:  Concentration: Good and Attention Span: Good  Recall:  Good  Fund of Knowledge: Good  Language: Good  Akathisia:  No  Handed:  Right  AIMS (if indicated): not done  Assets:  Communication Skills Desire for Improvement  ADL's:  Intact  Cognition: WNL  Sleep:  Fair  Screenings: PHQ2-9     Office Visit from 12/29/2017 in Primary Care at [facility] from 04/04/2014 in Primary Care at  [facility] Total Score 0 0       Assessment and Plan:  SHALISE ROSADO is a 34 y.o. year old female with a history of anxiety,idiopathic intracranialhypertension, hypertension, obesity, who presents for follow up appointment for below.   1. MDD (major depressive disorder), recurrent episode, mild (HCC) There has been significant improvement and depressive symptoms since up titration of Abilify.  Psychosocial stressors includes demoralization from intracranial hypertension, unemployment, and conflict with her boyfriend and her parents.  We do further up titration of Abilify to optimize treatment for depression.  Discussed potential metabolic side effect and EPS.  Will continue Lexapro to target depression.    Plan I have reviewed and updated plans as below 1.Continuelexapro 20 mg daily 2. Increase Abilify 10 mg daily  - Try melatonin 3 mg at night 2 hours before going to sleep 3. Next appointment: 1/17 at 2 PM for 30 mins, video - She sees a therapist at [facility] in [place] (on hold due to financial strain) - on Topamax  The patient demonstrates the following risk factors for suicide: Chronic risk factors for suicide include:psychiatric disorder ofdepression. Acute risk factorsfor suicide include: unemployment and loss (financial, interpersonal, professional). Protective factorsfor this patient include: positive social support. Considering these factors, the overall suicide risk at this point appears to below. Patientisappropriate for outpatient follow up.  Norman Clay, MD 09/29/2020, 2:06 PM

## 2020-09-29 ENCOUNTER — Telehealth (HOSPITAL_COMMUNITY): Payer: 59 | Admitting: Psychiatry

## 2020-09-29 ENCOUNTER — Encounter: Payer: Self-pay | Admitting: Psychiatry

## 2020-09-29 ENCOUNTER — Telehealth (INDEPENDENT_AMBULATORY_CARE_PROVIDER_SITE_OTHER): Payer: 59 | Admitting: Psychiatry

## 2020-09-29 ENCOUNTER — Other Ambulatory Visit: Payer: Self-pay

## 2020-09-29 DIAGNOSIS — F330 Major depressive disorder, recurrent, mild: Secondary | ICD-10-CM

## 2020-09-29 MED ORDER — ESCITALOPRAM OXALATE 20 MG PO TABS: 20.0000 mg | ORAL_TABLET | Freq: Every day | ORAL | 1 refills | Status: DC

## 2020-09-29 MED ORDER — ARIPIPRAZOLE 10 MG PO TABS
10.0000 mg | ORAL_TABLET | Freq: Every day | ORAL | 1 refills | Status: DC
Start: 2020-09-29 — End: 2020-12-22

## 2020-09-29 NOTE — Patient Instructions (Signed)
1.Continuelexapro 20 mg daily 2. Increase Abilify 10 mg daily  3. Next appointment: 1/17 at 2 PM

## 2020-09-30 ENCOUNTER — Encounter: Payer: Self-pay | Admitting: Family Medicine

## 2020-11-18 NOTE — Progress Notes (Signed)
Virtual Visit via Video Note  I connected with Anita Hodges on 11/24/20 at  2:00 PM EST by a video enabled telemedicine application and verified that I am speaking with the correct person using two identifiers.  Location: Patient: home Provider: office Persons participated in the visit- patient, provider   I discussed the limitations of evaluation and management by telemedicine and the availability of in person appointments. The patient expressed understanding and agreed to proceed. I discussed the assessment and treatment plan with the patient. The patient was provided an opportunity to ask questions and all were answered. The patient agreed with the plan and demonstrated an understanding of the instructions.   The patient was advised to call back or seek an in-person evaluation if the symptoms worsen or if the condition fails to improve as anticipated.  I provided 15 minutes of non-face-to-face time during this encounter.   Norman Clay, MD    [facility] MD/PA/NP OP Progress Note  11/24/2020 2:24 PM Anita Hodges  MRN:  [id number]  Chief Complaint:  Chief Complaint    Follow-up; Depression     HPI:  This is a follow-up appointment for depression.  She states that she "motivated then slacked off."  She has been taking Abilify 5 mg instead of 10 mg due to the reason she cannot recollect.  She also forgets to take medication at times.  Although she was doing more; cleaning the house with the hope to get a job and get back to normal life, she has not been able to find a job. She wonders if she is able to do well in the interview. Although she states that it was "just another day" when she was asked about the holiday, she later states that she had good time with her family; her parents, her sister, her sister's family, and aunt, cousins.  She also had a good time with her family on her birthday.  She has fair relationship with her boyfriend.  She goes outside when the weather is good.  She  also worked on remodeling her kitchen.  She is unable to see her therapist due to financial strain.  She has occasional insomnia.  She feels fatigue.  She has significant difficulty in concentration.  She has been working on [employer], and has lost some weight.  She denies SI. She is willing to try pill box to improve adherence.   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 MDD (major depressive disorder), recurrent episode, mild ([place])  F33.0     Past Psychiatric History: Please see initial evaluation for full details. I have reviewed the history. No updates at this time.     Past Medical History:  Past Medical History:  Diagnosis Date  . Allergy   . Anxiety   . Hypertension   . Obesity     Past Surgical History:  Procedure Laterality Date  . CHOLECYSTECTOMY N/A 12/27/2018   Procedure: LAPAROSCOPIC CHOLECYSTECTOMY;  Surgeon: Aviva Signs, MD;  Location: [facility] ORS;  Service: General;  Laterality: N/A;  pt knows to arrive at 6:15  . COLONOSCOPY WITH PROPOFOL N/A 07/23/2019   Procedure: COLONOSCOPY WITH PROPOFOL;  Surgeon: Daneil Dolin, MD;  Location: [facility];  Service: Endoscopy;  Laterality: N/A;  1:15pm  .  ESOPHAGOGASTRODUODENOSCOPY (EGD) WITH PROPOFOL N/A 12/18/2018   mild erosive reflux esophagitis, normal stomach, normal duodenum.   Marland Kitchen POLYPECTOMY  07/23/2019   Procedure: POLYPECTOMY;  Surgeon: Daneil Dolin, MD;  Location: [facility];  Service: Endoscopy;;  . TONSILLECTOMY    . WISDOM TOOTH EXTRACTION      Family Psychiatric History: Please see initial evaluation for full details. I have reviewed the history. No updates at this time.     Family History:  Family History  Problem Relation  Age of Onset  . Hypertension Mother   . Colon polyps Mother        unknown age of onset  . Gallbladder disease Mother   . Diabetes Father   . Gallbladder disease Father   . COPD Father   . Cancer Maternal Grandfather        ?  Marland Kitchen Cancer Paternal Grandmother        ?  . Colon cancer Neg Hx     Social History:  Social History   Socioeconomic History  . Marital status: Single    Spouse name: Not on file  . Number of children: Not on file  . Years of education: Not on file  . Highest education level: Not on file  Occupational History  . Not on file  Tobacco Use  . Smoking status: Never Smoker  . Smokeless tobacco: Never Used  Vaping Use  . Vaping Use: Never used  Substance and Sexual Activity  . Alcohol use: No    Alcohol/week: 0.0 standard drinks  . Drug use: No  . Sexual activity: Yes    Partners: Male    Birth control/protection: I.U.D.    Comment: 1st intercourse- 43, partners- 50, current partner- 4 months   Other Topics Concern  . Not on file  Social History Narrative   No children.      Social Determinants of Health   Financial Resource Strain: Not on file  Food Insecurity: Not on file  Transportation Needs: Not on file  Physical Activity: Not on file  Stress: Not on file  Social Connections: Not on file    Allergies:  Allergies  Allergen Reactions  . Codeine Other (See Comments)    DIZZINESS AND COLD (SICK)    Metabolic Disorder Labs: Lab Results  Component Value Date   HGBA1C 5.8 (H) 01/09/2020   MPG 120 01/09/2020   MPG 114 01/02/2013   Lab Results  Component Value Date   PROLACTIN 14.0 02/06/2015   Lab Results  Component Value Date   CHOL 145 05/26/2016   TRIG 77 05/26/2016   HDL 35 (L) 05/26/2016   CHOLHDL 4.1 05/26/2016   VLDL 15 05/26/2016   LDLCALC 95 05/26/2016   LDLCALC 89 01/20/2015   Lab Results  Component Value Date   TSH 2.78 01/09/2020   TSH 1.69 05/26/2016    Therapeutic Level Labs: No results found for:  LITHIUM No results found for: VALPROATE No components found for:  CBMZ  Current Medications: Current Outpatient Medications  Medication Sig Dispense Refill  . acetaZOLAMIDE (DIAMOX) 125 MG tablet Take 125 mg by mouth daily.    Marland Kitchen albuterol (PROVENTIL) (2.5 MG/3ML) 0.083% nebulizer solution Take 3 mLs (2.5 mg total) by nebulization every 6 (six) hours as needed for wheezing or shortness of breath. 75 mL 2  . albuterol (VENTOLIN HFA) 108 (90 Base) MCG/ACT inhaler Inhale 1-2 puffs into the lungs every 6 (six) hours as needed for wheezing or shortness of breath. 18 g 0  .  amLODipine (NORVASC) 10 MG tablet Take 10 mg by mouth daily.    . ARIPiprazole (ABILIFY) 10 MG tablet Take 1 tablet (10 mg total) by mouth daily. 30 tablet 1  . atenolol (TENORMIN) 50 MG tablet Take 50 mg by mouth daily.    . benzonatate (TESSALON) 100 MG capsule Take 1 capsule (100 mg total) by mouth every 8 (eight) hours. 21 capsule 0  . budesonide-formoterol (SYMBICORT) 160-4.5 MCG/ACT inhaler Inhale 2 puffs into the lungs 2 (two) times daily.    Marland Kitchen escitalopram (LEXAPRO) 20 MG tablet Take 1 tablet (20 mg total) by mouth daily. 30 tablet 1  . fluticasone (FLONASE) 50 MCG/ACT nasal spray Place 1 spray into both nostrils daily for 14 days. 16 g 0  . Fluticasone-Salmeterol (ADVAIR DISKUS) 250-50 MCG/DOSE AEPB Inhale 1 puff into the lungs in the morning and at bedtime. 60 each 5  . Galcanezumab-gnlm (EMGALITY) 120 MG/ML SOSY Inject into the skin.    Marland Kitchen levocetirizine (XYZAL) 5 MG tablet Take 5 mg by mouth every morning.     . montelukast (SINGULAIR) 10 MG tablet Take 10 mg by mouth at bedtime.    . ondansetron (ZOFRAN) 4 MG tablet Take 1 tablet (4 mg total) by mouth every 8 (eight) hours as needed for nausea or vomiting. 60 tablet 3  . promethazine (PHENERGAN) 25 MG tablet Take 0.5 tablets (12.5 mg total) by mouth every 6 (six) hours as needed for nausea or vomiting. 30 tablet 0  . topiramate (TOPAMAX) 100 MG tablet Take 100 mg by  mouth 2 (two) times daily.     No current facility-administered medications for this visit.     Musculoskeletal: Strength & Muscle Tone: N/A Gait & Station: N/A Patient leans: N/A  Psychiatric Specialty Exam: Review of Systems  Psychiatric/Behavioral: Positive for decreased concentration, dysphoric mood and sleep disturbance. Negative for agitation, behavioral problems, confusion, hallucinations, self-injury and suicidal ideas. The patient is nervous/anxious. The patient is not hyperactive.   All other systems reviewed and are negative.   There were no vitals taken for this visit.There is no height or weight on file to calculate BMI.  General Appearance: Fairly Groomed  Eye Contact:  Good  Speech:  Clear and Coherent  Volume:  Normal  Mood:  Depressed  Affect:  Appropriate, Congruent and down at times  Thought Process:  Coherent  Orientation:  Full (Time, Place, and Person)  Thought Content: Logical   Suicidal Thoughts:  No  Homicidal Thoughts:  No  Memory:  Immediate;   Good  Judgement:  Good  Insight:  Fair  Psychomotor Activity:  Normal  Concentration:  Concentration: Good and Attention Span: Good  Recall:  Good  Fund of Knowledge: Good  Language: Good  Akathisia:  No  Handed:  Right  AIMS (if indicated): not done  Assets:  Communication Skills Desire for Improvement  ADL's:  Intact  Cognition: WNL  Sleep:  Fair   Screenings: PHQ2-9   [place] Office Visit from 12/29/2017 in Primary Care at [place] from 04/04/2014 in Primary Care at [facility]  PHQ-2 Total Score 0 0       Assessment and Plan:  Anita Hodges is a 35 y.o. year old female with a history of anxiety,idiopathic intracranialhypertension, hypertension, obesity, who presents for follow up appointment for below.   1. MDD (major depressive disorder), recurrent episode, mild ([place]) She continues to report depressive symptoms in the context of non adherence to medication.  Other  psychosocial stressors includes demoralization  from  intracranial hypertension, unemployment, and conflict with her boyfriend and her parents.  Provided psychoeducation to improve adherence to medication.  Will continue current dose of Lexapro to target depression.  We will continue Abilify at lower dose at this time given she reports preference to stay on the lower dose.  Discussed potential metabolic side effect and EPS.   Plan I have reviewed and updated plans as below 1.Continuelexapro 20 mg daily - she does not take this consistently, and she likely has medication left 2.Decrease Abilify 5mg  daily  - Try melatonin 3 mg at night 2 hours before going to sleep 3. Next appointment:2/14 at 1:20 for 20 mins, video - She sees a therapist at [facility] in [place] hold due to financial strain) - on Topamax  The patient demonstrates the following risk factors for suicide: Chronic risk factors for suicide include:psychiatric disorder ofdepression. Acute risk factorsfor suicide include: unemployment and loss (financial, interpersonal, professional). Protective factorsfor this patient include: positive social support. Considering these factors, the overall suicide risk at this point appears to below. Patientisappropriate for outpatient follow up.   Norman Clay, MD 11/24/2020, 2:24 PM

## 2020-11-24 ENCOUNTER — Telehealth (INDEPENDENT_AMBULATORY_CARE_PROVIDER_SITE_OTHER): Payer: 59 | Admitting: Psychiatry

## 2020-11-24 ENCOUNTER — Encounter: Payer: Self-pay | Admitting: Psychiatry

## 2020-11-24 ENCOUNTER — Other Ambulatory Visit: Payer: Self-pay

## 2020-11-24 DIAGNOSIS — F330 Major depressive disorder, recurrent, mild: Secondary | ICD-10-CM

## 2020-11-24 MED ORDER — ARIPIPRAZOLE 5 MG PO TABS: 5.0000 mg | ORAL_TABLET | Freq: Every day | ORAL | 1 refills | Status: DC

## 2020-12-17 NOTE — Progress Notes (Signed)
Virtual Visit via Video Note  I connected with Anita Hodges on 12/22/20 at  1:20 PM EST by a video enabled telemedicine application and verified that I am speaking with the correct person using two identifiers.  Location: Patient: home Provider: office Persons participated in the visit- patient, provider   I discussed the limitations of evaluation and management by telemedicine and the availability of in person appointments. The patient expressed understanding and agreed to proceed.   I discussed the assessment and treatment plan with the patient. The patient was provided an opportunity to ask questions and all were answered. The patient agreed with the plan and demonstrated an understanding of the instructions.   The patient was advised to call back or seek an in-person evaluation if the symptoms worsen or if the condition fails to improve as anticipated.  I provided 15 minutes of non-face-to-face time during this encounter.   Anita Clay, MD    [facility] MD/PA/NP OP Progress Note  12/22/2020 1:44 PM Anita Hodges  MRN:  [id number]  Chief Complaint:  Chief Complaint    Follow-up; Depression     HPI:  This is a follow-up appointment for depression.  She states that she lost long-term disability.  She was discharged from the clinic as she did not want to have surgery for intracranial hypertension.  She states that she is concerned of pandemic.  She was also told that surgery may not get rid of her pain.  She will also not be able to afford for the surgery.  Although she has been applying for a job, and has done interviews, she has been declined.  Although she is trying to be positive, it has been difficult at times.  She has good time with her boyfriend when they are together.  However, she tends to sleep through the day when she does not feel good.  She sleeps during the day due to insomnia at night.  She feels fatigue.  She has difficulty in concentration.  She denies change in  appetite.  She denies SI.  She states that she may lose her insurance, and will not be able to make a follow-up appointment. Although she cannot recollect details, she had some side effect from Abilify 10 mg. However, she is willing to try 7.5 mg this time.    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 MDD (major depressive disorder), recurrent episode, moderate (HCC)  F33.1 escitalopram (LEXAPRO) 20 MG tablet    Past Psychiatric History: Please see initial evaluation for full details. I have reviewed the history. No updates at this time.     Past Medical History:  Past Medical History:  Diagnosis Date  . Allergy   . Anxiety   . Hypertension   . Obesity     Past Surgical History:  Procedure Laterality Date  . CHOLECYSTECTOMY N/A 12/27/2018   Procedure: LAPAROSCOPIC CHOLECYSTECTOMY;  Surgeon: Aviva Signs, MD;  Location: [facility];  Service: General;  Laterality: N/A;  pt knows to arrive at 6:15  . COLONOSCOPY WITH PROPOFOL N/A 07/23/2019   Procedure: COLONOSCOPY WITH PROPOFOL;  Surgeon: Daneil Dolin, MD;  Location: [facility];  Service: Endoscopy;  Laterality: N/A;  1:15pm  . ESOPHAGOGASTRODUODENOSCOPY (EGD) WITH PROPOFOL N/A 12/18/2018   mild erosive  reflux esophagitis, normal stomach, normal duodenum.   Marland Kitchen POLYPECTOMY  07/23/2019   Procedure: POLYPECTOMY;  Surgeon: Daneil Dolin, MD;  Location: [facility];  Service: Endoscopy;;  . TONSILLECTOMY    . WISDOM TOOTH EXTRACTION      Family Psychiatric History: Please see initial evaluation for full details. I have reviewed the history. No updates at this time.     Family History:  Family History  Problem Relation Age of Onset  . Hypertension  Mother   . Colon polyps Mother        unknown age of onset  . Gallbladder disease Mother   . Diabetes Father   . Gallbladder disease Father   . COPD Father   . Cancer Maternal Grandfather        ?  Marland Kitchen Cancer Paternal Grandmother        ?  . Colon cancer Neg Hx     Social History:  Social History   Socioeconomic History  . Marital status: Single    Spouse name: Not on file  . Number of children: Not on file  . Years of education: Not on file  . Highest education level: Not on file  Occupational History  . Not on file  Tobacco Use  . Smoking status: Never Smoker  . Smokeless tobacco: Never Used  Vaping Use  . Vaping Use: Never used  Substance and Sexual Activity  . Alcohol use: No    Alcohol/week: 0.0 standard drinks  . Drug use: No  . Sexual activity: Yes    Partners: Male    Birth control/protection: I.U.D.    Comment: 1st intercourse- 67, partners- 72, current partner- 4 months   Other Topics Concern  . Not on file  Social History Narrative   No children.      Social Determinants of Health   Financial Resource Strain: Not on file  Food Insecurity: Not on file  Transportation Needs: Not on file  Physical Activity: Not on file  Stress: Not on file  Social Connections: Not on file    Allergies:  Allergies  Allergen Reactions  . Codeine Other (See Comments)    DIZZINESS AND COLD (SICK)    Metabolic Disorder Labs: Lab Results  Component Value Date   HGBA1C 5.8 (H) 01/09/2020   MPG 120 01/09/2020   MPG 114 01/02/2013   Lab Results  Component Value Date   PROLACTIN 14.0 02/06/2015   Lab Results  Component Value Date   CHOL 145 05/26/2016   TRIG 77 05/26/2016   HDL 35 (L) 05/26/2016   CHOLHDL 4.1 05/26/2016   VLDL 15 05/26/2016   LDLCALC 95 05/26/2016   LDLCALC 89 01/20/2015   Lab Results  Component Value Date   TSH 2.78 01/09/2020   TSH 1.69 05/26/2016    Therapeutic Level Labs: No results found for: LITHIUM No results found for:  VALPROATE No components found for:  CBMZ  Current Medications: Current Outpatient Medications  Medication Sig Dispense Refill  . acetaZOLAMIDE (DIAMOX) 125 MG tablet Take 125 mg by mouth daily.    Marland Kitchen albuterol (PROVENTIL) (2.5 MG/3ML) 0.083% nebulizer solution Take 3 mLs (2.5 mg total) by nebulization every 6 (six) hours as needed for wheezing or shortness of breath. 75 mL 2  . albuterol (VENTOLIN HFA) 108 (90 Base) MCG/ACT inhaler Inhale 1-2 puffs into the lungs every 6 (six) hours as needed for wheezing or shortness of breath. 18 g 0  . amLODipine (NORVASC) 10 MG tablet Take 10 mg  by mouth daily.    . ARIPiprazole (ABILIFY) 5 MG tablet Take 1.5 tablets (7.5 mg total) by mouth daily. 45 tablet 1  . atenolol (TENORMIN) 50 MG tablet Take 50 mg by mouth daily.    . benzonatate (TESSALON) 100 MG capsule Take 1 capsule (100 mg total) by mouth every 8 (eight) hours. 21 capsule 0  . budesonide-formoterol (SYMBICORT) 160-4.5 MCG/ACT inhaler Inhale 2 puffs into the lungs 2 (two) times daily.    Marland Kitchen escitalopram (LEXAPRO) 20 MG tablet Take 1 tablet (20 mg total) by mouth daily. 30 tablet 1  . fluticasone (FLONASE) 50 MCG/ACT nasal spray Place 1 spray into both nostrils daily for 14 days. 16 g 0  . Fluticasone-Salmeterol (ADVAIR DISKUS) 250-50 MCG/DOSE AEPB Inhale 1 puff into the lungs in the morning and at bedtime. 60 each 5  . Galcanezumab-gnlm (EMGALITY) 120 MG/ML SOSY Inject into the skin.    Marland Kitchen levocetirizine (XYZAL) 5 MG tablet Take 5 mg by mouth every morning.     . montelukast (SINGULAIR) 10 MG tablet Take 10 mg by mouth at bedtime.    . ondansetron (ZOFRAN) 4 MG tablet Take 1 tablet (4 mg total) by mouth every 8 (eight) hours as needed for nausea or vomiting. 60 tablet 3  . promethazine (PHENERGAN) 25 MG tablet Take 0.5 tablets (12.5 mg total) by mouth every 6 (six) hours as needed for nausea or vomiting. 30 tablet 0  . topiramate (TOPAMAX) 100 MG tablet Take 100 mg by mouth 2 (two) times daily.      No current facility-administered medications for this visit.     Musculoskeletal: Strength & Muscle Tone: N/A Gait & Station: N/A Patient leans: N/A  Psychiatric Specialty Exam: Review of Systems  Psychiatric/Behavioral: Positive for decreased concentration, dysphoric mood and sleep disturbance. Negative for agitation, behavioral problems, confusion, hallucinations, self-injury and suicidal ideas. The patient is nervous/anxious. The patient is not hyperactive.   All other systems reviewed and are negative.   There were no vitals taken for this visit.There is no height or weight on file to calculate BMI.  General Appearance: Fairly Groomed  Eye Contact:  Good  Speech:  Clear and Coherent  Volume:  Normal  Mood:  Depressed  Affect:  Appropriate, Congruent, Tearful and down  Thought Process:  Coherent  Orientation:  Full (Time, Place, and Person)  Thought Content: Logical   Suicidal Thoughts:  No  Homicidal Thoughts:  No  Memory:  Immediate;   Good  Judgement:  Good  Insight:  Good  Psychomotor Activity:  Normal  Concentration:  Concentration: Good and Attention Span: Good  Recall:  Good  Fund of Knowledge: Good  Language: Good  Akathisia:  No  Handed:  Right  AIMS (if indicated): not done  Assets:  Communication Skills Desire for Improvement  ADL's:  Intact  Cognition: WNL  Sleep:  Poor   Screenings: PHQ2-9   Flowsheet Row Video Visit from 12/22/2020 in [place] Office Visit from 12/29/2017 in Primary Care at [place] from 04/04/2014 in Primary Care at [facility] Total Score 5 0 0  PHQ-9 Total Score 19 -- --    Flowsheet Row Video Visit from 12/22/2020 in [place] No Risk       Assessment and Plan:  ELIANE HAMMERSMITH is a 35 y.o. year old female with a history of anxiety,idiopathic intracranialhypertension, hypertension, obesity, who presents for follow up  appointment for below.   1.  MDD (major depressive disorder), recurrent episode, moderate (HCC) Exam is notable for tearfulness, and she continues to report depressive symptoms in the context of losing long-term disability, unemployment, and demoralization from intracranial hypertension, and conflict with her boyfriend and her parents.  Will uptitrate Abilify to optimize treatment for depression.  Noted that she did have some side effects on 10 mg, although she cannot recollect details.  She is advised to contact the office if she experiences any side effect.  She is aware of its potential risk of metabolic side effect and EPS.  Will continue Lexapro to target depression.   Of note, she will lose her insurance and will not be able to make a follow-up appointment.  She is advised to be seen at least by PCP to continue her medication.   Plan I have reviewed and updated plans as below 1.ContinueLexapro 20 mg daily   2.Increase Abilify7.5mg  daily (some side effect (she cannot remember details) from 10 mg) - Try melatonin 3 mg at night 2 hours before going to sleep 3. Next appointment:as needed-she may lost her insurance - She sees a therapist at [facility] in [place] hold due to financial strain) - on Topamax  The patient demonstrates the following risk factors for suicide: Chronic risk factors for suicide include:psychiatric disorder ofdepression. Acute risk factorsfor suicide include: unemployment and loss (financial, interpersonal, professional). Protective factorsfor this patient include: positive social support. Considering these factors, the overall suicide risk at this point appears to below. Patientisappropriate for outpatient follow up.   Anita Clay, MD 12/22/2020, 1:44 PM

## 2020-12-22 ENCOUNTER — Telehealth (INDEPENDENT_AMBULATORY_CARE_PROVIDER_SITE_OTHER): Payer: Self-pay | Admitting: Psychiatry

## 2020-12-22 ENCOUNTER — Encounter: Payer: Self-pay | Admitting: Psychiatry

## 2020-12-22 ENCOUNTER — Other Ambulatory Visit: Payer: Self-pay

## 2020-12-22 DIAGNOSIS — F331 Major depressive disorder, recurrent, moderate: Secondary | ICD-10-CM

## 2020-12-22 MED ORDER — ARIPIPRAZOLE 5 MG PO TABS
7.5000 mg | ORAL_TABLET | Freq: Every day | ORAL | 1 refills | Status: DC
Start: 2020-12-22 — End: 2021-06-22

## 2020-12-22 MED ORDER — ESCITALOPRAM OXALATE 20 MG PO TABS: 20.0000 mg | ORAL_TABLET | Freq: Every day | ORAL | 1 refills | Status: DC

## 2021-04-07 ENCOUNTER — Telehealth: Payer: Self-pay | Admitting: Internal Medicine

## 2021-04-07 ENCOUNTER — Encounter: Payer: Self-pay | Admitting: Internal Medicine

## 2021-04-07 ENCOUNTER — Other Ambulatory Visit: Payer: Self-pay

## 2021-04-07 ENCOUNTER — Other Ambulatory Visit: Payer: Self-pay | Admitting: Internal Medicine

## 2021-04-07 ENCOUNTER — Ambulatory Visit (INDEPENDENT_AMBULATORY_CARE_PROVIDER_SITE_OTHER): Payer: 59 | Admitting: Internal Medicine

## 2021-04-07 VITALS — BP 148/86 | HR 103 | Temp 98.0°F | Ht 65.0 in | Wt >= 6400 oz

## 2021-04-07 DIAGNOSIS — J4540 Moderate persistent asthma, uncomplicated: Secondary | ICD-10-CM

## 2021-04-07 DIAGNOSIS — J309 Allergic rhinitis, unspecified: Secondary | ICD-10-CM | POA: Diagnosis not present

## 2021-04-07 MED ORDER — FLUTICASONE-SALMETEROL 250-50 MCG/ACT IN AEPB: 1.0000 | INHALATION_SPRAY | Freq: Two times a day (BID) | RESPIRATORY_TRACT | 5 refills | Status: DC

## 2021-04-07 MED ORDER — ALBUTEROL SULFATE (2.5 MG/3ML) 0.083% IN NEBU: 2.5000 mg | INHALATION_SOLUTION | Freq: Four times a day (QID) | RESPIRATORY_TRACT | 2 refills | Status: AC | PRN

## 2021-04-07 MED ORDER — ALBUTEROL SULFATE HFA 108 (90 BASE) MCG/ACT IN AERS: 1.0000 | INHALATION_SPRAY | Freq: Four times a day (QID) | RESPIRATORY_TRACT | 5 refills | Status: DC | PRN

## 2021-04-07 NOTE — Patient Instructions (Addendum)
The patient should have follow up scheduled with myself in 6 months.   I am sending a nebulizer machine to your house.   Start taking advair (wixela generic) 1 puff twice a day.   Take the albuterol rescue inhaler every 4 to 6 hours as needed for wheezing or shortness of breath. You can also take it 15 minutes before exercise or exertional activity. Side effects include heart racing or pounding, jitters or anxiety. If you have a history of an irregular heart rhythm, it can make this worse. Can also give some patients a hard time sleeping.  Keep taking allergy meds - flonase, singulair, xyzal.

## 2021-04-07 NOTE — Progress Notes (Signed)
Anita Hodges    [id number]    04/10/1986  Primary Care Physician:Hagler, Apolonio Schneiders, MD Date of Appointment: 04/07/2021 Established Patient Visit  Chief complaint:   Chief Complaint  Patient presents with  . Follow-up    Feeling the same as last visit, SOB, sometimes having a dry cough     HPI: Anita Hodges is a 35 y.o. woman with asthma and obesity   Interval Updates: Never picked up advair - too expensive. She ended up losing her job but has a new job with [employer] now.   Taking albuterol inhaler once a day, usually before bedtime. Very rarely needs to take it for chest tightness coughing or wheezing in between that. Would like to get back on maintenance medication  FeNO: 27 ppb  For rhinitis - taking flonase, montelukast No hospitalizations or ED visits for her breathing   Works from home for [employer].  I have reviewed the patient's family social and past medical history and updated as appropriate.   Past Medical History:  Diagnosis Date  . Allergy   . Anxiety   . Hypertension   . Obesity     Past Surgical History:  Procedure Laterality Date  . CHOLECYSTECTOMY N/A 12/27/2018   Procedure: LAPAROSCOPIC CHOLECYSTECTOMY;  Surgeon: Aviva Signs, MD;  Location: [facility];  Service: General;  Laterality: N/A;  pt knows to arrive at 6:15  . COLONOSCOPY WITH PROPOFOL N/A 07/23/2019   Procedure: COLONOSCOPY WITH PROPOFOL;  Surgeon: Daneil Dolin, MD;  [facility];  Service: Endoscopy;  Laterality: N/A;  1:15pm  . ESOPHAGOGASTRODUODENOSCOPY (EGD) WITH PROPOFOL N/A 12/18/2018   mild erosive reflux esophagitis, normal stomach, normal duodenum.   Marland Kitchen POLYPECTOMY  07/23/2019   Procedure: POLYPECTOMY;  Surgeon: Daneil Dolin, MD;  [facility];  Service: Endoscopy;;  . TONSILLECTOMY    . WISDOM TOOTH EXTRACTION      Family History  Problem Relation Age of Onset  . Hypertension Mother   . Colon polyps Mother         unknown age of onset  . Gallbladder disease Mother   . Diabetes Father   . Gallbladder disease Father   . COPD Father   . Cancer Maternal Grandfather        ?  Marland Kitchen Cancer Paternal Grandmother        ?  . Colon cancer Neg Hx     Social History   Occupational History  . Not on file  Tobacco Use  . Smoking status: Never Smoker  . Smokeless tobacco: Never Used  Vaping Use  . Vaping Use: Never used  Substance and Sexual Activity  . Alcohol use: No    Alcohol/week: 0.0 standard drinks  . Drug use: No  . Sexual activity: Yes    Partners: Male    Birth control/protection: I.U.D.    Comment: 1st intercourse- 80, partners- 39, current partner- 4 months      Physical Exam: Blood pressure (!) 148/86, pulse (!) 103, temperature 98 F (36.7 C), height 5\' 5"  (1.651 m), weight (!) 433 lb (196.4 kg), SpO2 97 %.  Gen:      No acute distress, morbidly obese ENT:  no nasal polyps, mucus membranes moist Lungs:    Diminished, No increased respiratory effort, symmetric chest wall excursion, clear to auscultation bilaterally, no wheezes or crackles CV:         Diminished Tachycardic, regular   Data  Reviewed: Imaging: I have personally reviewed the chest xray March 2021 which shows low lung volumes no acute process.   PFTs: I have personally reviewed the patient's PFTs and spirometry June 2021 suggests restriction to ventilation. No airflow limitation, no BD response.   Labs:  Immunization status: Immunization History  Administered Date(s) Administered  . Influenza, Quadrivalent, Recombinant, Inj, Pf 11/21/2019  . Influenza,inj,Quad PF,6+ Mos 07/27/2018    Assessment:  Moderate persistent asthma Chronic allergic rhinitis   Plan/Recommendations: start wixela 1 puff BID Continue prn albuterol Will prescribe nebulizer machine Continue singulair, flonase, xyzal for rhinitis  Return to Care: Return in about 6 months (around 10/07/2021).   Lenice Llamas, MD Pulmonary and  [place]

## 2021-04-07 NOTE — Telephone Encounter (Signed)
Called and spoke with the pharmacist. She stated that the patient's insurance will only cover ProAir. She just needed a verbal to change the RX. Advised her that it was ok to change it to ProAir. I will update her chart.   Nothing further needed at time of call.

## 2021-04-16 ENCOUNTER — Telehealth: Payer: Self-pay | Admitting: Internal Medicine

## 2021-04-16 NOTE — Telephone Encounter (Signed)
Called and spoke with Patient.  Patient stated Suzie Portela was to expensive, so she never started it. Patient stated her Dad has extra Symbicort and she was wanting to know if Symbicort would be ok to try.  Patient is not sure if it is Symbicort 80 or 160. Patient stated a call back tomorrow 04/17/21, will be ok.    Message routed to Dr. Shearon Stalls to advise  LOV 04/07/21 Dr. Shearon Stalls  Instructions   Return in about 6 months (around 10/07/2021). The patient should have follow up scheduled with myself in 6 months.   I am sending a nebulizer machine to your house.   Start taking advair (wixela generic) 1 puff twice a day.    Take the albuterol rescue inhaler every 4 to 6 hours as needed for wheezing or shortness of breath. You can also take it 15 minutes before exercise or exertional activity. Side effects include heart racing or pounding, jitters or anxiety. If you have a history of an irregular heart rhythm, it can make this worse. Can also give some patients a hard time sleeping.   Keep taking allergy meds - flonase, singulair, xyzal.

## 2021-04-17 ENCOUNTER — Telehealth: Payer: Self-pay | Admitting: Internal Medicine

## 2021-04-17 NOTE — Telephone Encounter (Signed)
PA request was received from (pharmacy): [facility], [place] Fax: [phone] Medication name and strength: fluticasone-salmeterol 250-85mcg Ordering Provider: Shearon Stalls  Was PA started with [facility]?: yes If yes, please enter KEY: [id number] Medication tried and failed: Advair Diskus and Symbicort   PA sent to plan, time frame for approval / denial: [employer] is reviewing your PA request. Typically an electronic response will be received within 24-72 hours. To check for an update later, open this request from your dashboard.   Routing to [place] for follow-up

## 2021-04-17 NOTE — Telephone Encounter (Signed)
Symbicort 160 would be ok

## 2021-04-20 ENCOUNTER — Other Ambulatory Visit: Payer: Self-pay | Admitting: *Deleted

## 2021-04-20 MED ORDER — BUDESONIDE-FORMOTEROL FUMARATE 160-4.5 MCG/ACT IN AERO: 2.0000 | INHALATION_SPRAY | Freq: Two times a day (BID) | RESPIRATORY_TRACT | 6 refills | Status: AC

## 2021-04-20 NOTE — Telephone Encounter (Signed)
Message received from Dr. Shearon Stalls:  Madaline Brilliant to send in Symbicort 160.  Script sent to pharmacy.

## 2021-04-20 NOTE — Telephone Encounter (Signed)
ATC x1, no answer, VM full.  Script sent to pharmacy for Symbicort 160 to pharmacy requested by patient previously.

## 2021-04-21 NOTE — Telephone Encounter (Signed)
Outcome of PA:  Denied on June 10 Request Reference Number: [id number]. FLUTIC/SALME AER 250/50 is denied for not meeting the prior authorization requirement(s). Details of this decision are in the notice attached below or have been faxed to you. Appeals are not supported through [facility]. Please refer to the fax case notice for appeals information and instructions.  Order already sent to pharmacy for Symbicort.  Nothing further needed.  Closing encounter.

## 2021-04-21 NOTE — Telephone Encounter (Signed)
Spoke with the pt and notified that her symbicort was sent to pharm  Nothing further needed

## 2021-06-03 ENCOUNTER — Ambulatory Visit
Admission: EM | Admit: 2021-06-03 | Discharge: 2021-06-03 | Disposition: A | Discharge status: Home / Self Care | Payer: 59 | Attending: Family Medicine | Admitting: Family Medicine

## 2021-06-03 ENCOUNTER — Other Ambulatory Visit: Payer: Self-pay

## 2021-06-03 DIAGNOSIS — I10 Essential (primary) hypertension: Secondary | ICD-10-CM

## 2021-06-03 DIAGNOSIS — R1011 Right upper quadrant pain: Secondary | ICD-10-CM | POA: Diagnosis not present

## 2021-06-03 DIAGNOSIS — R110 Nausea: Secondary | ICD-10-CM

## 2021-06-03 DIAGNOSIS — M545 Low back pain, unspecified: Secondary | ICD-10-CM

## 2021-06-03 LAB — POCT URINALYSIS DIP (MANUAL ENTRY)
Blood, UA: NEGATIVE
Glucose, UA: NEGATIVE mg/dL
Ketones, POC UA: NEGATIVE mg/dL
Leukocytes, UA: NEGATIVE
Nitrite, UA: NEGATIVE
Protein Ur, POC: NEGATIVE mg/dL
Spec Grav, UA: >=1.03 — AB (ref 1.010–1.025)
Urobilinogen, UA: 0.2 E.U./dL
pH, UA: 5 (ref 5.0–8.0)

## 2021-06-03 LAB — POCT URINE PREGNANCY: Preg Test, Ur: NEGATIVE

## 2021-06-03 MED ORDER — ONDANSETRON 4 MG PO TBDP: 4.0000 mg | ORAL_TABLET | Freq: Three times a day (TID) | ORAL | 0 refills | Status: AC | PRN

## 2021-06-03 MED ORDER — PREDNISONE 20 MG PO TABS: 40.0000 mg | ORAL_TABLET | Freq: Every day | ORAL | 0 refills | Status: DC

## 2021-06-03 NOTE — Discharge Instructions (Addendum)
You have been seen today for abdominal pain. Your evaluation was not suggestive of any emergent condition requiring medical intervention at this time. However, some abdominal problems make take more time to appear. Therefore, it is very important for you to pay attention to any new symptoms or worsening of your current condition.  Please return here or to the Emergency Department immediately should you begin to feel worse in any way or have any of the following symptoms: increasing or different abdominal pain, persistent vomiting, inability to drink fluids, fevers, or shaking chills.   You have had labs (blood work) drawn today. We will call you with any significant abnormalities or if there is need to begin or change treatment or pursue further follow up.  You may also review your test results online through [facility]. If you do not have a MyChart account, instructions to sign up should be on your discharge paperwork.  Your blood pressure was noted to be elevated during your visit today. If you are currently taking medication for high blood pressure, please ensure you are taking this as directed. If you do not have a history of high blood pressure and your blood pressure remains persistently elevated, you may need to begin taking a medication at some point. You may return here within the next few days to recheck if unable to see your primary care provider or if you do not have a one.  BP (!) 172/103 (BP Location: Right Wrist)   Pulse 95   Temp 98 F (36.7 C) (Tympanic)   Resp 20   LMP 05/25/2021   SpO2 94%   BP Readings from Last 3 Encounters:  06/03/21 (!) 172/103  04/07/21 (!) 148/86  04/18/20 126/84

## 2021-06-03 NOTE — ED Triage Notes (Signed)
Pt has had intermittent episodes of vomiting since Friday, states abdomen is tender to palpation , appetite has been poor

## 2021-06-04 LAB — COMPREHENSIVE METABOLIC PANEL
ALT: 21 IU/L (ref 0–32)
AST: 22 IU/L (ref 0–40)
Albumin/Globulin Ratio: 1.4 (ref 1.2–2.2)
Albumin: 3.8 g/dL (ref 3.8–4.8)
Alkaline Phosphatase: 119 IU/L (ref 44–121)
BUN/Creatinine Ratio: 15 (ref 9–23)
BUN: 9 mg/dL (ref 6–20)
Bilirubin Total: 0.3 mg/dL (ref 0.0–1.2)
CO2: 21 mmol/L (ref 20–29)
Calcium: 8.6 mg/dL — ABNORMAL LOW (ref 8.7–10.2)
Chloride: 101 mmol/L (ref 96–106)
Creatinine, Ser: 0.59 mg/dL (ref 0.57–1.00)
Globulin, Total: 2.8 g/dL (ref 1.5–4.5)
Glucose: 89 mg/dL (ref 65–99)
Potassium: 4.5 mmol/L (ref 3.5–5.2)
Sodium: 137 mmol/L (ref 134–144)
Total Protein: 6.6 g/dL (ref 6.0–8.5)
eGFR: 120 mL/min/{1.73_m2} (ref >59)

## 2021-06-04 LAB — CBC WITH DIFFERENTIAL/PLATELET
Basophils Absolute: 0 10*3/uL (ref 0.0–0.2)
Basos: 0 %
EOS (ABSOLUTE): 0.2 10*3/uL (ref 0.0–0.4)
Eos: 1 %
Hematocrit: 41.6 % (ref 34.0–46.6)
Hemoglobin: 13.6 g/dL (ref 11.1–15.9)
Immature Grans (Abs): 0 10*3/uL (ref 0.0–0.1)
Immature Granulocytes: 0 %
Lymphocytes Absolute: 3.1 10*3/uL (ref 0.7–3.1)
Lymphs: 27 %
MCH: 26.9 pg (ref 26.6–33.0)
MCHC: 32.7 g/dL (ref 31.5–35.7)
MCV: 82 fL (ref 79–97)
Monocytes Absolute: 0.5 10*3/uL (ref 0.1–0.9)
Monocytes: 4 %
Neutrophils Absolute: 7.4 10*3/uL — ABNORMAL HIGH (ref 1.4–7.0)
Neutrophils: 68 %
Platelets: 384 10*3/uL (ref 150–450)
RBC: 5.05 x10E6/uL (ref 3.77–5.28)
RDW: 14.1 % (ref 11.7–15.4)
WBC: 11.2 10*3/uL — ABNORMAL HIGH (ref 3.4–10.8)

## 2021-06-06 NOTE — ED Provider Notes (Signed)
[place]   [id number] 06/03/21 Arrival Time: 3557  ASSESSMENT & PLAN:  1. Abdominal pain, right upper quadrant   2. Nausea   3. Acute right-sided low back pain without sciatica   4. Elevated blood pressure reading in office with diagnosis of hypertension    Benign abdominal and back exam. With low back pain exacerbation. No indications for urgent abdominal/pelvic imaging at this time. H/O cholecystectomy. Normal PO intake with mild nausea. Would like a trial of prednisone to see if this helps with back. Discussed. WBC just slightly elevated. She understands to seek ED evaluation should any of her symptoms worsen. No signs/symptoms of hypertensive urgency.  Meds ordered this encounter  Medications   predniSONE (DELTASONE) 20 MG tablet    Sig: Take 2 tablets (40 mg total) by mouth daily.    Dispense:  10 tablet    Refill:  0   ondansetron (ZOFRAN-ODT) 4 MG disintegrating tablet    Sig: Take 1 tablet (4 mg total) by mouth every 8 (eight) hours as needed for nausea or vomiting.    Dispense:  15 tablet    Refill:  0     Discharge Instructions      You have been seen today for abdominal pain. Your evaluation was not suggestive of any emergent condition requiring medical intervention at this time. However, some abdominal problems make take more time to appear. Therefore, it is very important for you to pay attention to any new symptoms or worsening of your current condition.  Please return here or to the Emergency Department immediately should you begin to feel worse in any way or have any of the following symptoms: increasing or different abdominal pain, persistent vomiting, inability to drink fluids, fevers, or shaking chills.   You have had labs (blood work) drawn today. We will call you with any significant abnormalities or if there is need to begin or change treatment or pursue further follow up.  You may also review your test results online through [facility]. If you  do not have a MyChart account, instructions to sign up should be on your discharge paperwork.  Your blood pressure was noted to be elevated during your visit today. If you are currently taking medication for high blood pressure, please ensure you are taking this as directed. If you do not have a history of high blood pressure and your blood pressure remains persistently elevated, you may need to begin taking a medication at some point. You may return here within the next few days to recheck if unable to see your primary care provider or if you do not have a one.  BP (!) 172/103 (BP Location: Right Wrist)   Pulse 95   Temp 98 F (36.7 C) (Tympanic)   Resp 20   LMP 05/25/2021   SpO2 94%   BP Readings from Last 3 Encounters:  06/03/21 (!) 172/103  04/07/21 (!) 148/86  04/18/20 126/84       Follow-up Information     Schedule an appointment as soon as possible for a visit  with Caren Macadam, MD.   Specialty: Family Medicine Contact information: [place] [address] [phone]         [facility] EMERGENCY DEPARTMENT.   Specialty: Emergency Medicine Why: If worsening or failing to improve as anticipated. Contact information: [address] [id number] [place] [phone]                Reviewed  expectations re: course of current medical issues. Questions answered. Outlined signs and symptoms indicating need for more acute intervention. Patient verbalized understanding. After Visit Summary given.   SUBJECTIVE: History from: patient. Anita Hodges is a 35 y.o. female who presents with complaint of intermittent R sided LBP; long-standing but with acute exacerbation. No trauma. No extremity sensation changes or weakness. Also reports RUQ abd pain; past few days. Nausea with occas emesis. Afebrile. Ambulatory without difficulty. No urinary symptoms. Abd pain does not wake her at night. Feels  she is moving bowels normally. No tx PTA.  Patient's last menstrual period was 05/25/2021. Past Surgical History:  Procedure Laterality Date   CHOLECYSTECTOMY N/A 12/27/2018   Procedure: LAPAROSCOPIC CHOLECYSTECTOMY;  Surgeon: Aviva Signs, MD;  Location: [facility];  Service: General;  Laterality: N/A;  pt knows to arrive at 6:15   COLONOSCOPY WITH PROPOFOL N/A 07/23/2019   Procedure: COLONOSCOPY WITH PROPOFOL;  Surgeon: Daneil Dolin, MD;  Location: [facility];  Service: Endoscopy;  Laterality: N/A;  1:15pm   ESOPHAGOGASTRODUODENOSCOPY (EGD) WITH PROPOFOL N/A 12/18/2018   mild erosive reflux esophagitis, normal stomach, normal duodenum.    POLYPECTOMY  07/23/2019   Procedure: POLYPECTOMY;  Surgeon: Daneil Dolin, MD;  Location: [facility];  Service: Endoscopy;;   TONSILLECTOMY     WISDOM TOOTH EXTRACTION       OBJECTIVE:  Vitals:   06/03/21 1749  BP: (!) 172/103  Pulse: 95  Resp: 20  Temp: 98 F (36.7 C)  TempSrc: Tympanic  SpO2: 94%    General appearance: alert, oriented, no acute distress; morbid obesity HEENT: [place]; AT; oropharynx moist Lungs: unlabored respirations Abdomen: body habitus limits exam; soft; without distention; mild  and poorly localized tenderness to palpation over RUQ ; without masses or organomegaly; without guarding or rebound tenderness Back: with reported tenderness around SI joint distribution; FROM at waist Extremities: without LE edema; symmetrical; without gross deformities Skin: warm and dry Neurologic: normal gait Psychological: alert and cooperative; normal mood and affect  Labs: Results for orders placed or performed during the hospital encounter of 06/03/21  CBC with Differential/Platelet  Result Value Ref Range   WBC 11.2 (H) 3.4 - 10.8 x10E3/uL   RBC 5.05 3.77 - 5.28 x10E6/uL   Hemoglobin 13.6 11.1 - 15.9 g/dL   Hematocrit 41.6 34.0 - 46.6 %   MCV 82 79 - 97 fL   MCH 26.9 26.6 - 33.0 pg   MCHC 32.7 31.5 - 35.7 g/dL   RDW 14.1  11.7 - 15.4 %   Platelets 384 150 - 450 x10E3/uL   Neutrophils 68 Not Estab. %   Lymphs 27 Not Estab. %   Monocytes 4 Not Estab. %   Eos 1 Not Estab. %   Basos 0 Not Estab. %   Neutrophils Absolute 7.4 (H) 1.4 - 7.0 x10E3/uL   Lymphocytes Absolute 3.1 0.7 - 3.1 x10E3/uL   Monocytes Absolute 0.5 0.1 - 0.9 x10E3/uL   EOS (ABSOLUTE) 0.2 0.0 - 0.4 x10E3/uL   Basophils Absolute 0.0 0.0 - 0.2 x10E3/uL   Immature Granulocytes 0 Not Estab. %   Immature Grans (Abs) 0.0 0.0 - 0.1 x10E3/uL  Comprehensive metabolic panel  Result Value Ref Range   Glucose 89 65 - 99 mg/dL   BUN 9 6 - 20 mg/dL   Creatinine, Ser 0.59 0.57 - 1.00 mg/dL   eGFR 120 >59 mL/min/1.73   BUN/Creatinine Ratio 15 9 - 23   Sodium 137 134 - 144 mmol/L  Potassium 4.5 3.5 - 5.2 mmol/L   Chloride 101 96 - 106 mmol/L   CO2 21 20 - 29 mmol/L   Calcium 8.6 (L) 8.7 - 10.2 mg/dL   Total Protein 6.6 6.0 - 8.5 g/dL   Albumin 3.8 3.8 - 4.8 g/dL   Globulin, Total 2.8 1.5 - 4.5 g/dL   Albumin/Globulin Ratio 1.4 1.2 - 2.2   Bilirubin Total 0.3 0.0 - 1.2 mg/dL   Alkaline Phosphatase 119 44 - 121 IU/L   AST 22 0 - 40 IU/L   ALT 21 0 - 32 IU/L  POCT urinalysis dipstick  Result Value Ref Range   Color, UA yellow yellow   Clarity, UA clear clear   Glucose, UA negative negative mg/dL   Bilirubin, UA small (A) negative   Ketones, POC UA negative negative mg/dL   Spec Grav, UA >=1.030 (A) 1.010 - 1.025   Blood, UA negative negative   pH, UA 5.0 5.0 - 8.0   Protein Ur, POC negative negative mg/dL   Urobilinogen, UA 0.2 0.2 or 1.0 E.U./dL   Nitrite, UA Negative Negative   Leukocytes, UA Negative Negative  POCT urine pregnancy  Result Value Ref Range   Preg Test, Ur Negative Negative   Labs Reviewed  CBC WITH DIFFERENTIAL/PLATELET - Abnormal; Notable for the following components:      Result Value   WBC 11.2 (*)    Neutrophils Absolute 7.4 (*)    All other components within normal limits   Narrative:    Performed at:  [facility] [address], [place], [place]  [id number] Lab Director: Rush Farmer MD, Phone:  [phone]  COMPREHENSIVE METABOLIC PANEL - Abnormal; Notable for the following components:   Calcium 8.6 (*)    All other components within normal limits   Narrative:    Performed at:  [facility] [address] [place], [place]  [id number] Lab Director: Rush Farmer MD, Phone:  [phone]  POCT URINALYSIS DIP (MANUAL ENTRY) - Abnormal; Notable for the following components:   Bilirubin, UA small (*)    Spec Grav, UA >=1.030 (*)    All other components within normal limits  LIPASE, BLOOD  POCT URINE PREGNANCY     Allergies  Allergen Reactions   Codeine Other (See Comments)    DIZZINESS AND COLD (SICK)                                               Past Medical History:  Diagnosis Date   Allergy    Anxiety    Hypertension    Obesity     Social History   Socioeconomic History   Marital status: Single    Spouse name: Not on file   Number of children: Not on file   Years of education: Not on file   Highest education level: Not on file  Occupational History   Not on file  Tobacco Use   Smoking status: Never   Smokeless tobacco: Never  Vaping Use   Vaping Use: Never used  Substance and Sexual Activity   Alcohol use: No    Alcohol/week: 0.0 standard drinks   Drug use: No   Sexual activity: Yes    Partners: Male    Birth control/protection: I.U.D.    Comment: 1st intercourse- 31, partners- 88, current partner- 4 months   Other Topics Concern  Not on file  Social History Narrative   No children.      Social Determinants of Health   Financial Resource Strain: Not on file  Food Insecurity: Not on file  Transportation Needs: Not on file  Physical Activity: Not on file  Stress: Not on file  Social Connections: Not on file  Intimate Partner Violence: Not on file    Family History  Problem Relation Age of Onset   Hypertension Mother    Colon  polyps Mother        unknown age of onset   Gallbladder disease Mother    Diabetes Father    Gallbladder disease Father    COPD Father    Cancer Maternal Grandfather        ?   Cancer Paternal Grandmother        ?   Colon cancer Neg Hx      Vanessa Kick, MD 06/06/21 [phone]

## 2021-06-22 ENCOUNTER — Other Ambulatory Visit: Payer: Self-pay

## 2021-06-22 ENCOUNTER — Encounter: Payer: Self-pay | Admitting: Neurology

## 2021-06-22 ENCOUNTER — Ambulatory Visit (INDEPENDENT_AMBULATORY_CARE_PROVIDER_SITE_OTHER): Payer: 59 | Admitting: Neurology

## 2021-06-22 VITALS — BP 140/84 | HR 91 | Ht 63.0 in | Wt >= 6400 oz

## 2021-06-22 DIAGNOSIS — G932 Benign intracranial hypertension: Secondary | ICD-10-CM | POA: Diagnosis not present

## 2021-06-22 DIAGNOSIS — G43709 Chronic migraine without aura, not intractable, without status migrainosus: Secondary | ICD-10-CM

## 2021-06-22 MED ORDER — VENLAFAXINE HCL ER 37.5 MG PO CP24: 37.5000 mg | ORAL_CAPSULE | Freq: Every day | ORAL | 11 refills | Status: DC

## 2021-06-22 MED ORDER — SUMATRIPTAN SUCCINATE 100 MG PO TABS: 100.0000 mg | ORAL_TABLET | Freq: Once | ORAL | 6 refills | Status: DC | PRN

## 2021-06-22 NOTE — Progress Notes (Signed)
Chief Complaint  Patient presents with   New Patient (Initial Visit)    Room 16 - alone. Referral for IIH and migraines. She has been on acetazolamide '125mg'$  daily but had adverse side effects (does not remember what it caused). She was changed to topiramate '100mg'$ , one tab BID but it caused cognitive and mood swings. She has not been on any medications in the last three months. Reports worsening, daily headaches. At one point, she was seen by neurosurgery, Dr. Caryl Comes. She discussed having a shunt placed but decided against the surgery.       ASSESSMENT AND PLAN  Anita Hodges is a 35 y.o. female   History of pseudotumor cerebri  Lumbar puncture in November 2020 showed opening pressure of 310 mm  Normal MRI brain November 2020  Referral to formal ophthalmology evaluation, if she continue has evidence of papillary edema, may consider repeat fluoroscopy guided lumbar puncture  Chronic headache  With migraine features,  She does not want to try either Diamox or Topamax due to unbearable side effect will try Effexor XR 37.5 mg daily  Imitrex 100 mg as needed   DIAGNOSTIC DATA (LABS, IMAGING, TESTING) - I reviewed patient records, labs, notes, testing and imaging myself where available.  Laboratory evaluations in May 2022, TSH was mildly elevated at 4.67, normal CBC hemoglobin of 13.7, free T4 0.8  Fundus photography OU December 17, 2019 The optic disc appearance is normal, no disc edema or pallor is  identified.  Epiretinal membranes are noted, right greater than left.  Macula OCT on February 05 2020  Both eyes: Mild inner retinal corrugations nasally, no fluid  MRV of brain : Normal intracranial MR venography.   MRI of brain w/wo on Sep 20 2019 was normal.  Lumbar puncture on Oct 02 2019, open pressure 310 mm H2O.  [facility] Exam on Dec 17 2019: Possible slight enlargement of blind spot in both eye. Interventional venogram on June 04, 2020, diminutive left transverse  sinus, continuing into the sigmoid sinus with a high-grade stenosis at the junction, right transverse sinus micro injection demonstrated filling of the right transverse sinus without any significant stenotic component with  MEDICAL HISTORY:  Anita Hodges is a 35 year old female, seen in request by her primary care physician Dr. Mannie Stabile, Apolonio Schneiders, for evaluation of intracranial hypertension, chronic migraine headache, initial evaluation was June 22, 2021  I reviewed and summarized the referring note. PMHx. Depression HTN Morbid obesity, weight gain of 40 pounds over the past 1 year, BMI of 78  She began to noticed gradual worsening headache since 2020, it has become constant daily headaches, also with light noise sensitivity, irritable, noticed blurry vision, she was seen by local neurologist, was diagnosed with pseudotumor three-point  I reviewed extensive evaluation from [facility] system, MRI of the brain with without contrast November 2020 was normal  MRV of the brain was normal  Lumbar puncture on October 02, 2019 with opening pressure of 310 mm water  [facility] eye examination in February 2021 showed enlarged blind spots in both eyes  Per patient, she was referred to see neurosurgeon, had IR venogram, which showed high-grade stenosis at left transverse sinus continuing into sigmoid sinus, patent on the right side, patient reported, she was on the schedule for stent placement, for high-grade stenosis at the left transverse sinus, that was postponed and lost follow-up due to pandemic  She was treated with Diamox 125 mg given low-dose she complains of side effect, had a short  trial of Topamax 100 mg twice daily, complains significant mood swings, and cognitive impairment is longer taking it  She now complains of constant daily pressure headaches stemming from lack of nuchal, occipital region, holoacranial, 8 out of 10, she tried over-the-counter medicine does not help, now she tends to lying  down to help her headache, couple times each week, it would exacerbate to much more severe headaches, pounding, light noise sensitivity  She reported she had a recent eye examination in July 2022, getting a new pair of glasses, but there was no significant edema per patient report,  I do not have formal report    PHYSICAL EXAM:   Vitals:   06/22/21 1406  BP: 140/84  Pulse: 91  Weight: (!) 442 lb 8 oz (200.7 kg)  Height: '5\' 3"'$  (1.6 m)   Not recorded     Body mass index is 78.39 kg/m.  PHYSICAL EXAMNIATION:  Gen: NAD, conversant, well nourised, well groomed                     Cardiovascular: Regular rate rhythm, no peripheral edema, warm, nontender. Eyes: Conjunctivae clear without exudates or hemorrhage Neck: Supple, no carotid bruits. Pulmonary: Clear to auscultation bilaterally   NEUROLOGICAL EXAM:  MENTAL STATUS: morbid obesity Speech:    Speech is normal; fluent and spontaneous with normal comprehension.  Cognition:     Orientation to time, place and person     Normal recent and remote memory     Normal Attention span and concentration     Normal Language, naming, repeating,spontaneous speech     Fund of knowledge   CRANIAL NERVES: CN II: Visual fields are full to confrontation. Pupils are round equal and briskly reactive to light. CN III, IV, VI: extraocular movement are normal. No ptosis. CN V: Facial sensation is intact to light touch CN VII: Face is symmetric with normal eye closure  CN VIII: Hearing is normal to causal conversation. CN IX, X: Phonation is normal. CN XI: Head turning and shoulder shrug are intact  MOTOR: There is no pronator drift of out-stretched arms. Muscle bulk and tone are normal. Muscle strength is normal.  REFLEXES: Reflexes are 2+ and symmetric at the biceps, triceps, knees, and ankles. Plantar responses are flexor.  SENSORY: Intact to light touch, pinprick and vibratory sensation are intact in fingers and  toes.  COORDINATION: There is no trunk or limb dysmetria noted.  GAIT/STANCE: Gait is limited due her big body habitus.  REVIEW OF SYSTEMS:  Full 14 system review of systems performed and notable only for as above All other review of systems were negative.   ALLERGIES: Allergies  Allergen Reactions   Codeine Other (See Comments)    DIZZINESS AND COLD (SICK)   Topiramate Other (See Comments)    Cognitive changes, mood swings    HOME MEDICATIONS: Current Outpatient Medications  Medication Sig Dispense Refill   albuterol (PROVENTIL) (2.5 MG/3ML) 0.083% nebulizer solution Take 3 mLs (2.5 mg total) by nebulization every 6 (six) hours as needed for wheezing or shortness of breath. 75 mL 2   albuterol (VENTOLIN HFA) 108 (90 Base) MCG/ACT inhaler Inhale 1-2 puffs into the lungs every 6 (six) hours as needed for wheezing or shortness of breath. 18 g 0   amLODipine (NORVASC) 10 MG tablet Take 10 mg by mouth daily.     atenolol (TENORMIN) 50 MG tablet Take 50 mg by mouth daily.     budesonide-formoterol (SYMBICORT) 160-4.5 MCG/ACT inhaler Inhale 2  puffs into the lungs 2 (two) times daily. 1 each 6   escitalopram (LEXAPRO) 20 MG tablet Take 1 tablet (20 mg total) by mouth daily. 30 tablet 1   levocetirizine (XYZAL) 5 MG tablet Take 5 mg by mouth every morning.      montelukast (SINGULAIR) 10 MG tablet Take 10 mg by mouth at bedtime.     ondansetron (ZOFRAN-ODT) 4 MG disintegrating tablet Take 1 tablet (4 mg total) by mouth every 8 (eight) hours as needed for nausea or vomiting. 15 tablet 0   promethazine (PHENERGAN) 25 MG tablet Take 0.5 tablets (12.5 mg total) by mouth every 6 (six) hours as needed for nausea or vomiting. 30 tablet 0   fluticasone (FLONASE) 50 MCG/ACT nasal spray Place 1 spray into both nostrils daily for 14 days. 16 g 0   No current facility-administered medications for this visit.    PAST MEDICAL HISTORY: Past Medical History:  Diagnosis Date   Allergy    Anxiety     Hypertension    IIH (idiopathic intracranial hypertension)    Migraine    Obesity     PAST SURGICAL HISTORY: Past Surgical History:  Procedure Laterality Date   CHOLECYSTECTOMY N/A 12/27/2018   Procedure: LAPAROSCOPIC CHOLECYSTECTOMY;  Surgeon: Aviva Signs, MD;  Location: [facility];  Service: General;  Laterality: N/A;  pt knows to arrive at 6:15   COLONOSCOPY WITH PROPOFOL N/A 07/23/2019   Procedure: COLONOSCOPY WITH PROPOFOL;  Surgeon: Daneil Dolin, MD;  Location[facility];  Service: Endoscopy;  Laterality: N/A;  1:15pm   ESOPHAGOGASTRODUODENOSCOPY (EGD) WITH PROPOFOL N/A 12/18/2018   mild erosive reflux esophagitis, normal stomach, normal duodenum.    POLYPECTOMY  07/23/2019   Procedure: POLYPECTOMY;  Surgeon: Daneil Dolin, MD;  [facility];  Service: Endoscopy;;   TONSILLECTOMY     WISDOM TOOTH EXTRACTION      FAMILY HISTORY: Family History  Problem Relation Age of Onset   Hypertension Mother    Colon polyps Mother        unknown age of onset   Gallbladder disease Mother    Gallbladder disease Father    COPD Father    Cancer Maternal Grandfather        ?   Cancer Paternal Grandmother        ?   Colon cancer Neg Hx     SOCIAL HISTORY: Social History   Socioeconomic History   Marital status: Single    Spouse name: Not on file   Number of children: 0   Years of education: college   Highest education level: Bachelor's degree (e.g., BA, AB, BS)  Occupational History   Occupation: Glass blower/designer  Tobacco Use   Smoking status: Never   Smokeless tobacco: Never  Vaping Use   Vaping Use: Never used  Substance and Sexual Activity   Alcohol use: No    Alcohol/week: 0.0 standard drinks   Drug use: No   Sexual activity: Yes    Partners: Male    Birth control/protection: I.U.D.    Comment: 1st intercourse- 15, partners- 21, current partner- 4 months   Other Topics Concern   Not on file  Social History Narrative   Lives with boyfriend.    Right-handed.   Three cups caffeine per day.      Social Determinants of Health   Financial Resource Strain: Not on file  Food Insecurity: Not on file  Transportation Needs: Not on file  Physical Activity: Not on file  Stress:  Not on file  Social Connections: Not on file  Intimate Partner Violence: Not on file      Marcial Pacas, M.D. Ph.D.  [facility] [address], [place], [place] [address] Ph: [phone] Fax: [phone]  CC:  Caren Macadam, MD [place],  [place] [address]  Caren Macadam, MD

## 2021-06-23 ENCOUNTER — Telehealth: Payer: Self-pay | Admitting: Neurology

## 2021-06-23 NOTE — Telephone Encounter (Signed)
Sent to Dr. Katy Fitch [facility] ph  # [phone]

## 2021-08-18 ENCOUNTER — Ambulatory Visit (HOSPITAL_COMMUNITY)
Admission: RE | Admit: 2021-08-18 | Discharge: 2021-08-18 | Disposition: A | Discharge status: Home / Self Care | Payer: 59 | Source: Ambulatory Visit | Attending: Family Medicine | Admitting: Family Medicine

## 2021-08-18 ENCOUNTER — Other Ambulatory Visit (HOSPITAL_COMMUNITY): Payer: Self-pay | Admitting: Family Medicine

## 2021-08-18 ENCOUNTER — Other Ambulatory Visit: Payer: Self-pay

## 2021-08-18 DIAGNOSIS — M5442 Lumbago with sciatica, left side: Secondary | ICD-10-CM

## 2021-09-01 ENCOUNTER — Ambulatory Visit: Payer: 59 | Admitting: Orthopedic Surgery

## 2021-09-07 ENCOUNTER — Ambulatory Visit: Payer: 59 | Admitting: Orthopedic Surgery

## 2021-09-08 ENCOUNTER — Ambulatory Visit (INDEPENDENT_AMBULATORY_CARE_PROVIDER_SITE_OTHER): Payer: 59 | Admitting: Orthopedic Surgery

## 2021-09-08 ENCOUNTER — Other Ambulatory Visit: Payer: Self-pay

## 2021-09-08 ENCOUNTER — Encounter: Payer: Self-pay | Admitting: Orthopedic Surgery

## 2021-09-08 VITALS — BP 167/118 | HR 108 | Ht 65.0 in | Wt >= 6400 oz

## 2021-09-08 DIAGNOSIS — G8929 Other chronic pain: Secondary | ICD-10-CM

## 2021-09-08 DIAGNOSIS — M5440 Lumbago with sciatica, unspecified side: Secondary | ICD-10-CM

## 2021-09-08 DIAGNOSIS — Z6841 Body Mass Index (BMI) 40.0 and over, adult: Secondary | ICD-10-CM | POA: Diagnosis not present

## 2021-09-08 NOTE — Progress Notes (Signed)
New Patient Visit  Assessment: Anita Hodges is a 35 y.o. female with the following: 1. Chronic midline low back pain with sciatica, sciatica laterality unspecified  Plan: Progressively worsening lower back pain, with radiating pains into both legs.  She is tried over-the-counter pain medications.  No physical therapy.  Based on the radiating pains, an MRI may be appropriate in the future.  However, we will refer her to physical therapy prior to ordering MRI.  Follow-up as needed.  The patient meets the AMA guidelines for Morbid obesity with BMI > 40.  The patient has been counseled on weight loss.   Follow-up: Return if symptoms worsen or fail to improve.  Subjective:  Chief Complaint  Patient presents with   Back Pain    Lower back pain radiating down both legs to the feet getting worse over the past yr.     History of Present Illness: Anita Hodges is a 35 y.o. female who has been referred to clinic today by Caren Macadam, MD for evaluation of lower back pain.  She denies a specific injury.  She has had progressively worsening back pain for the past year.  She states that over this time, she started to develop more pain into both legs.  Symptoms include numbness and tingling in her feet.  This improves as she leans on a shopping cart for example.  She has tried medication such as Tylenol and ibuprofen with limited improvement in her symptoms.  No previous injections.  She has not worked with physical therapy.   Review of Systems: No fevers or chills No numbness or tingling No chest pain No shortness of breath No bowel or bladder dysfunction No GI distress No headaches   Medical History:  Past Medical History:  Diagnosis Date   Allergy    Anxiety    Hypertension    IIH (idiopathic intracranial hypertension)    Migraine    Obesity     Past Surgical History:  Procedure Laterality Date   CHOLECYSTECTOMY N/A 12/27/2018   Procedure: LAPAROSCOPIC CHOLECYSTECTOMY;   Surgeon: Aviva Signs, MD;  Location: [facility];  Service: General;  Laterality: N/A;  pt knows to arrive at 6:15   COLONOSCOPY WITH PROPOFOL N/A 07/23/2019   Procedure: COLONOSCOPY WITH PROPOFOL;  Surgeon: Daneil Dolin, MD;  Location: [facility];  Service: Endoscopy;  Laterality: N/A;  1:15pm   ESOPHAGOGASTRODUODENOSCOPY (EGD) WITH PROPOFOL N/A 12/18/2018   mild erosive reflux esophagitis, normal stomach, normal duodenum.    POLYPECTOMY  07/23/2019   Procedure: POLYPECTOMY;  Surgeon: Daneil Dolin, MD;  Location: [facility];  Service: Endoscopy;;   TONSILLECTOMY     WISDOM TOOTH EXTRACTION      Family History  Problem Relation Age of Onset   Hypertension Mother    Colon polyps Mother        unknown age of onset   Gallbladder disease Mother    Gallbladder disease Father    COPD Father    Cancer Maternal Grandfather        ?   Cancer Paternal Grandmother        ?   Colon cancer Neg Hx    Social History   Tobacco Use   Smoking status: Never   Smokeless tobacco: Never  Vaping Use   Vaping Use: Never used  Substance Use Topics   Alcohol use: No    Alcohol/week: 0.0 standard drinks   Drug use: No    Allergies  Allergen Reactions  Codeine Other (See Comments)    DIZZINESS AND COLD (SICK)   Topiramate Other (See Comments)    Cognitive changes, mood swings    Current Meds  Medication Sig   albuterol (PROVENTIL) (2.5 MG/3ML) 0.083% nebulizer solution Take 3 mLs (2.5 mg total) by nebulization every 6 (six) hours as needed for wheezing or shortness of breath.   albuterol (VENTOLIN HFA) 108 (90 Base) MCG/ACT inhaler Inhale 1-2 puffs into the lungs every 6 (six) hours as needed for wheezing or shortness of breath.   amLODipine (NORVASC) 10 MG tablet Take 10 mg by mouth daily.   atenolol (TENORMIN) 50 MG tablet Take 50 mg by mouth daily.   budesonide-formoterol (SYMBICORT) 160-4.5 MCG/ACT inhaler Inhale 2 puffs into the lungs 2 (two) times daily.   escitalopram (LEXAPRO)  20 MG tablet Take 1 tablet (20 mg total) by mouth daily.   levocetirizine (XYZAL) 5 MG tablet Take 5 mg by mouth every morning.    montelukast (SINGULAIR) 10 MG tablet Take 10 mg by mouth at bedtime.   ondansetron (ZOFRAN-ODT) 4 MG disintegrating tablet Take 1 tablet (4 mg total) by mouth every 8 (eight) hours as needed for nausea or vomiting.   promethazine (PHENERGAN) 25 MG tablet Take 0.5 tablets (12.5 mg total) by mouth every 6 (six) hours as needed for nausea or vomiting.   SUMAtriptan (IMITREX) 100 MG tablet Take 1 tablet (100 mg total) by mouth once as needed for up to 1 dose for migraine. May repeat in 2 hours if headache persists or recurs.   venlafaxine XR (EFFEXOR XR) 37.5 MG 24 hr capsule Take 1 capsule (37.5 mg total) by mouth daily with breakfast.    Objective: BP (!) 167/118   Pulse (!) 108   Ht 5\' 5"  (1.651 m)   Wt (!) 449 lb (203.7 kg)   BMI 74.72 kg/m   Physical Exam:  General: No acute distress., Age appropriate behavior., and Obese female. Gait: Slow, waddling gait.  Primarily midline point tenderness.  This is most prominent on the left side, at about the thoracolumbar junction.  She also has pain lower, in the area of the SI joints.  Negative straight leg raise bilaterally.  5/5 strength in bilateral lower extremities.  IMAGING: I personally reviewed images previously obtained in clinic  Lumbar spine x-rays without acute injuries.  Well-maintained disc height.  No anterolisthesis.  New Medications:  No orders of the defined types were placed in this encounter.     Mordecai Rasmussen, MD  09/08/2021 10:55 PM

## 2021-09-15 ENCOUNTER — Ambulatory Visit (HOSPITAL_COMMUNITY): Payer: 59

## 2021-10-05 ENCOUNTER — Telehealth: Payer: Self-pay | Admitting: Neurology

## 2021-10-05 NOTE — Telephone Encounter (Signed)
I called to confirm this patient's appointment for tomorrow and she said that she would like to be rescheduled with a different provider. I asked if she wanted to see a nurse practitioner this time and she said that she would like to be switched to a different neurologist and did not want to see Dr. Krista Blue again. I asked if there was a particular reason and she said "I just did not care for her." I confirmed with her that I am cancelling tomorrow's appointment and that our office will be in touch with her to schedule her with a new provider.

## 2021-10-05 NOTE — Telephone Encounter (Signed)
It is Ok to switch to different provider.

## 2021-10-05 NOTE — Telephone Encounter (Signed)
This patient has only had one new patient appointment with [place]. Will you please set her up with a different provider that fits her referral, thanks!

## 2021-10-06 ENCOUNTER — Ambulatory Visit: Payer: 59 | Admitting: Neurology

## 2021-10-06 ENCOUNTER — Ambulatory Visit (HOSPITAL_COMMUNITY): Payer: 59 | Admitting: Physical Therapy

## 2021-10-06 NOTE — Telephone Encounter (Signed)
Scheduled consult with patient.

## 2021-10-06 NOTE — Telephone Encounter (Signed)
Sure, I'll see her. Thanks

## 2021-10-29 ENCOUNTER — Institutional Professional Consult (permissible substitution): Payer: 59 | Admitting: Psychiatry

## 2021-12-01 ENCOUNTER — Emergency Department (HOSPITAL_COMMUNITY): Payer: 59

## 2021-12-01 ENCOUNTER — Encounter (HOSPITAL_COMMUNITY): Payer: Self-pay | Admitting: *Deleted

## 2021-12-01 ENCOUNTER — Emergency Department (HOSPITAL_COMMUNITY)
Admission: EM | Admit: 2021-12-01 | Discharge: 2021-12-01 | Disposition: A | Discharge status: Home / Self Care | Payer: 59 | Attending: Emergency Medicine | Admitting: Emergency Medicine

## 2021-12-01 DIAGNOSIS — R1084 Generalized abdominal pain: Secondary | ICD-10-CM | POA: Insufficient documentation

## 2021-12-01 DIAGNOSIS — R1011 Right upper quadrant pain: Secondary | ICD-10-CM | POA: Diagnosis not present

## 2021-12-01 LAB — CBC WITH DIFFERENTIAL/PLATELET
Abs Immature Granulocytes: 0.03 10*3/uL (ref 0.00–0.07)
Basophils Absolute: 0.1 10*3/uL (ref 0.0–0.1)
Basophils Relative: 1 %
Eosinophils Absolute: 0.2 10*3/uL (ref 0.0–0.5)
Eosinophils Relative: 2 %
HCT: 39.8 % (ref 36.0–46.0)
Hemoglobin: 13.1 g/dL (ref 12.0–15.0)
Immature Granulocytes: 0 %
Lymphocytes Relative: 28 %
Lymphs Abs: 2.7 10*3/uL (ref 0.7–4.0)
MCH: 28.6 pg (ref 26.0–34.0)
MCHC: 32.9 g/dL (ref 30.0–36.0)
MCV: 86.9 fL (ref 80.0–100.0)
Monocytes Absolute: 0.4 10*3/uL (ref 0.1–1.0)
Monocytes Relative: 5 %
Neutro Abs: 6.1 10*3/uL (ref 1.7–7.7)
Neutrophils Relative %: 64 %
Platelets: 337 10*3/uL (ref 150–400)
RBC: 4.58 MIL/uL (ref 3.87–5.11)
RDW: 15.5 % (ref 11.5–15.5)
WBC: 9.5 10*3/uL (ref 4.0–10.5)
nRBC: 0 % (ref 0.0–0.2)

## 2021-12-01 LAB — COMPREHENSIVE METABOLIC PANEL
ALT: 34 U/L (ref 0–44)
AST: 30 U/L (ref 15–41)
Albumin: 3.5 g/dL (ref 3.5–5.0)
Alkaline Phosphatase: 92 U/L (ref 38–126)
Anion gap: 10 (ref 5–15)
BUN: 8 mg/dL (ref 6–20)
CO2: 28 mmol/L (ref 22–32)
Calcium: 8.7 mg/dL — ABNORMAL LOW (ref 8.9–10.3)
Chloride: 100 mmol/L (ref 98–111)
Creatinine, Ser: 0.57 mg/dL (ref 0.44–1.00)
GFR, Estimated: >60 mL/min (ref >60)
Glucose, Bld: 97 mg/dL (ref 70–99)
Potassium: 3.9 mmol/L (ref 3.5–5.1)
Sodium: 138 mmol/L (ref 135–145)
Total Bilirubin: 0.8 mg/dL (ref 0.3–1.2)
Total Protein: 7 g/dL (ref 6.5–8.1)

## 2021-12-01 LAB — ETHANOL: Alcohol, Ethyl (B): <10 mg/dL (ref <10)

## 2021-12-01 LAB — LIPASE, BLOOD: Lipase: 33 U/L (ref 11–51)

## 2021-12-01 IMAGING — CT CT RENAL STONE PROTOCOL
2 of 4 series · 16 of 46 positions shown, 18 images · non-contrast
Comparison: CT [DATE]

CLINICAL DATA: Right flank and lateral abdominal pain.



[Series 2: axial st · axial · 0.98mm/px · z∈[+890,+1365]mm · 13 of 109 slices shown, 15 images]
[im 7/109  soft-tissue]
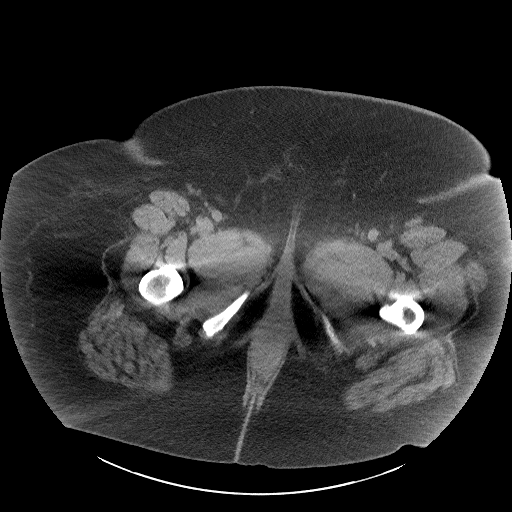
[im 7/109  bone]
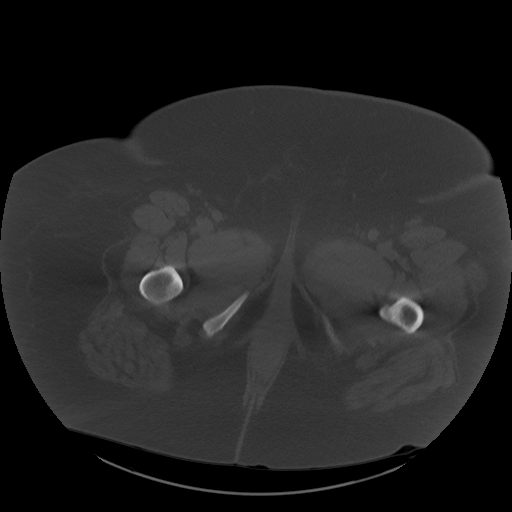
[im 13/109  soft-tissue]
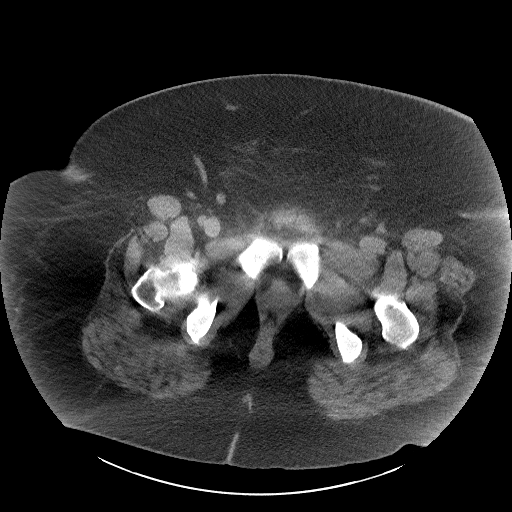
[im 26/109  soft-tissue]
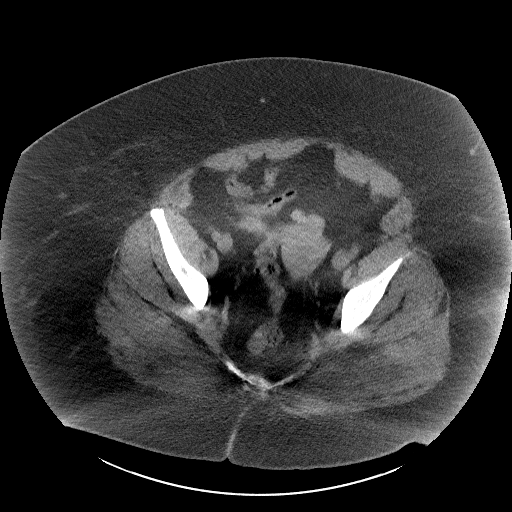
[im 32/109  soft-tissue]
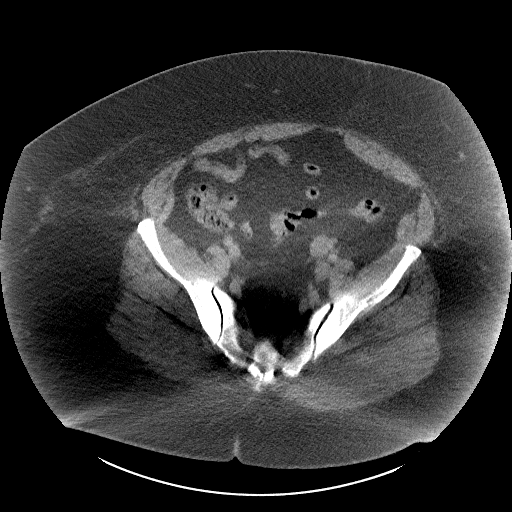
[im 39/109  soft-tissue]
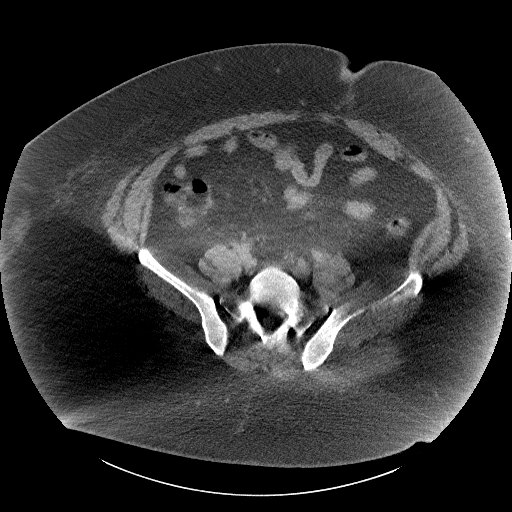
[im 45/109  soft-tissue]
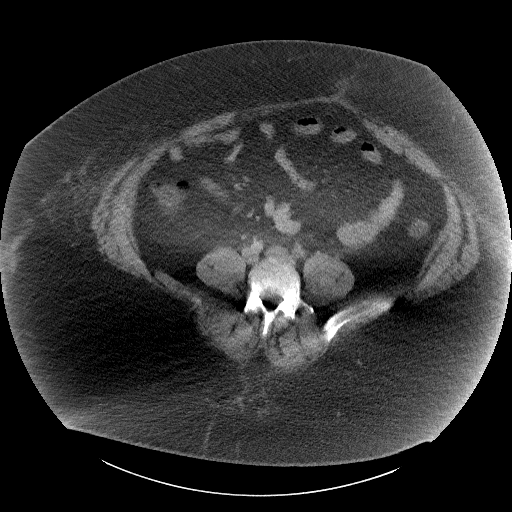
[im 58/109  soft-tissue]
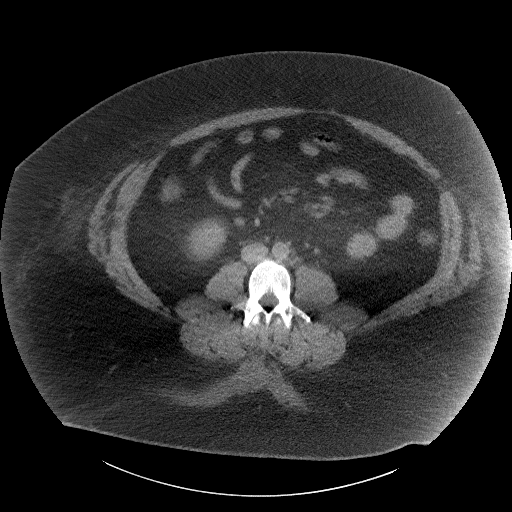
[im 64/109  soft-tissue]
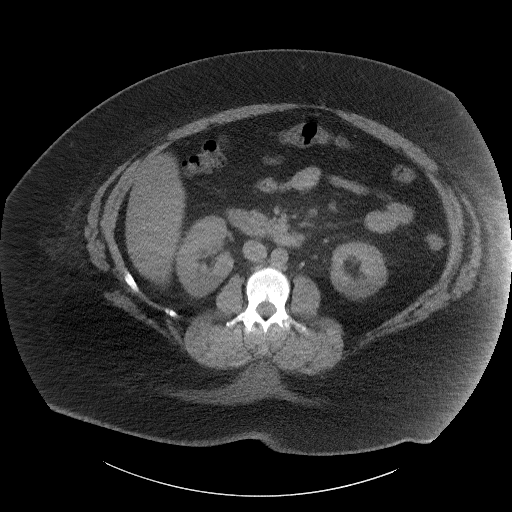
[im 70/109  soft-tissue]
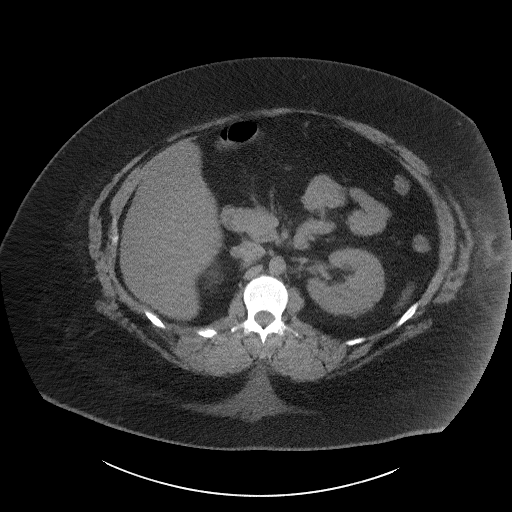
[im 70/109  bone]
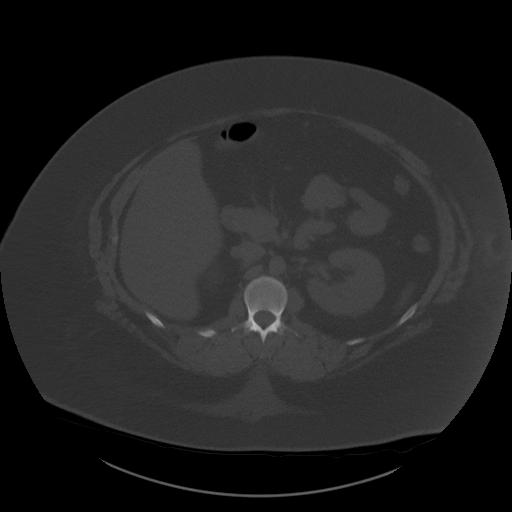
[im 77/109  soft-tissue]
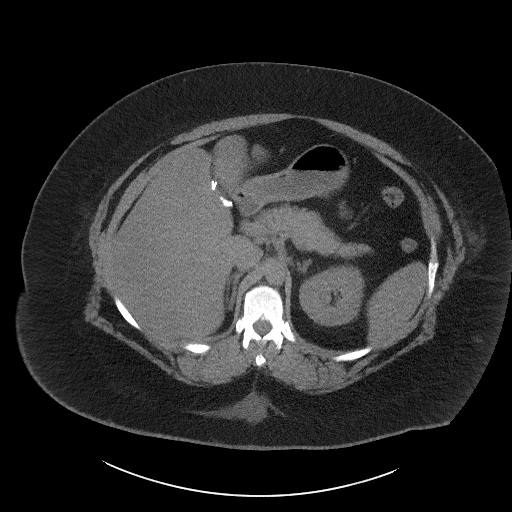
[im 83/109  soft-tissue]
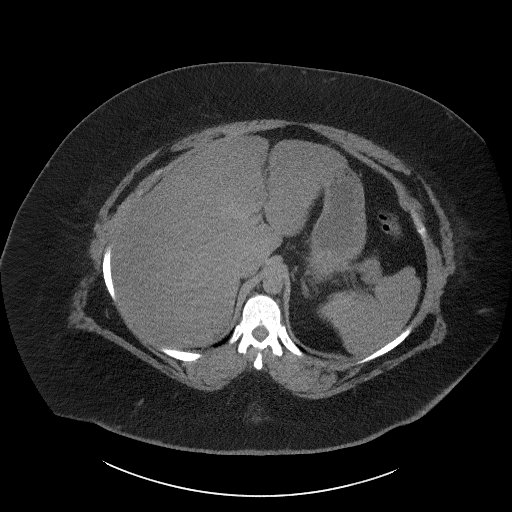
[im 96/109  soft-tissue]
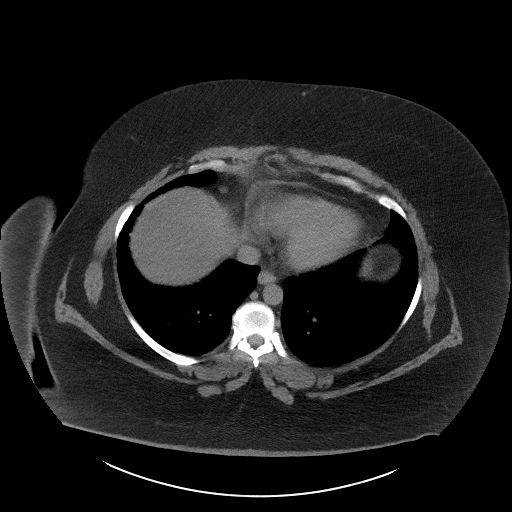
[im 102/109  soft-tissue]
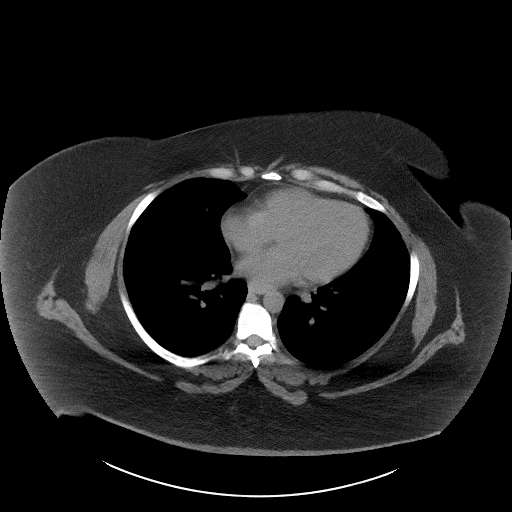

[Series 5: coronal st · coronal · 0.98mm/px · 3 of 119 slices shown]
[im 40/119  soft-tissue]
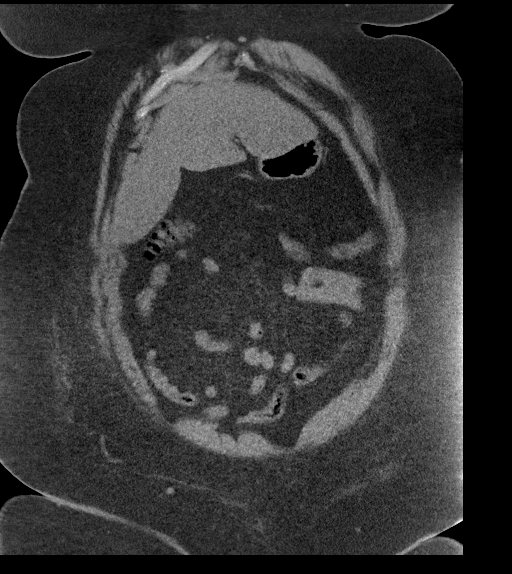
[im 53/119  soft-tissue]
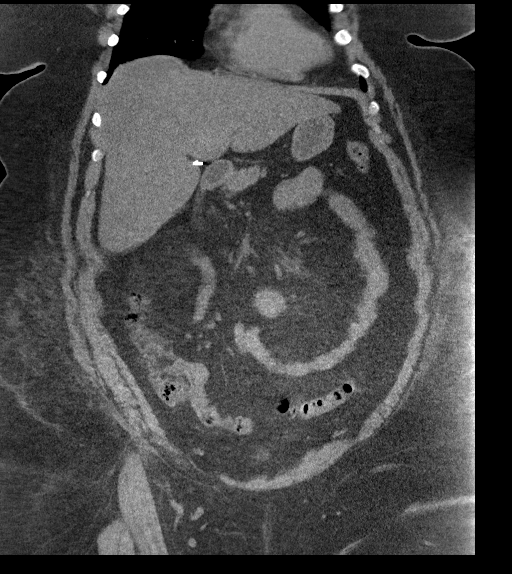
[im 66/119  soft-tissue]
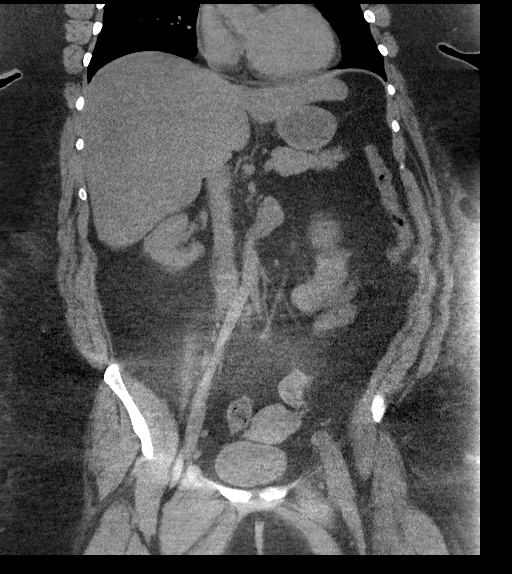

[16 of 46 positions shown; findings below may reference images not displayed]

FINDINGS: Lower chest: No consolidation or pleural effusion. Chronic
subpleural reticulation in the anterior right middle lobe.

Hepatobiliary: Enlarged liver spanning 20 cm cranial caudal with
diffuse steatosis. No focal hepatic abnormality on this unenhanced
exam. Clips in the gallbladder fossa postcholecystectomy. No biliary
dilatation.

Pancreas: No ductal dilatation or inflammation.

Spleen: Mildly enlarged, 13.4 cm cranial caudal. No focal
abnormality.

Adrenals/Urinary Tract: Normal adrenal glands. No hydronephrosis or
renal calculi. No definite perinephric edema, there is motion
artifact through the mid lower right kidney. No focal renal
abnormality is seen. Decompressed ureters without stones along the
course. Unremarkable urinary bladder.

Stomach/Bowel: Stomach is within normal limits. Appendix appears
normal. No evidence of bowel wall thickening, distention, or
inflammatory changes.

Vascular/Lymphatic: Normal caliber abdominal aorta. No portal venous
or mesenteric gas. Small central mesenteric lymph nodes with misty
mesentery. No enlarged lymph nodes by size criteria.

Reproductive: IUD is no longer present in the uterus. Small uterine
fibroids. Normal appearance of the ovaries, no adnexal mass.

Other: No free air, free fluid, or intra-abdominal fluid collection.
No abdominal wall hernia.

Musculoskeletal: Sclerosis about the iliac aspect of both iliac
bones. There are no acute or suspicious osseous abnormalities.
IMPRESSION: 1. No renal stones or obstructive uropathy.
2. Hepatosplenomegaly and hepatic steatosis. Capsular stretching of
the liver can contribute to right-sided pain.
3. IUD is no longer present in the uterus, recommend correlation
with clinical history if it has been removed. Small uterine
fibroids.

## 2021-12-01 MED ORDER — KETOROLAC TROMETHAMINE 30 MG/ML IJ SOLN
30.0000 mg | Freq: Once | INTRAMUSCULAR | Status: AC
  Administered 2021-12-01: 16:00:00 30 mg via INTRAMUSCULAR
  Filled 2021-12-01: qty 1

## 2021-12-01 NOTE — Discharge Instructions (Signed)
As discussed, your evaluation today has been largely reassuring.  But, it is important that you monitor your condition carefully, and do not hesitate to return to the ED if you develop new, or concerning changes in your condition.  Otherwise, please follow-up with your physician for appropriate ongoing care.   Your CT interpretation from today is included below: IMPRESSION: 1. No renal stones or obstructive uropathy. 2. Hepatosplenomegaly and hepatic steatosis. Capsular stretching of the liver can contribute to right-sided pain. 3. IUD is no longer present in the uterus, recommend correlation with clinical history if it has been removed. Small uterine fibroids.     Electronically Signed   By: Keith Rake M.D.   On: 12/01/2021 16:22

## 2021-12-01 NOTE — ED Triage Notes (Signed)
Pain under right rib cage radiating into pelvic area for 2 days

## 2021-12-01 NOTE — ED Provider Notes (Signed)
[facility] EMERGENCY DEPARTMENT Provider Note   CSN: [id number] Arrival date & time: 12/01/21  1456     History  Chief Complaint  Patient presents with   Abdominal Pain    Anita Hodges is a 36 y.o. female.  HPI Adult female presents with 4 days of pain in the right upper abdomen.  Radiationacross the superior abdomen and towards the inguinal crease, ipsilateral.  No fever, nausea, vomiting, pain is intermittent, inconsistent, without clear precipitating, alleviating or exacerbating factors.  She has a history of prior cholecystectomy, as well as idiopathic intracranial hypertension.  Last menstrual period was about 2 weeks ago.  No true urinary complaints, but she notes that she has been mildly oliguric during this illness.  She notes a history of COVID x3 over the past year.    Home Medications Prior to Admission medications   Medication Sig Start Date End Date Taking? Authorizing Provider  albuterol (PROVENTIL) (2.5 MG/3ML) 0.083% nebulizer solution Take 3 mLs (2.5 mg total) by nebulization every 6 (six) hours as needed for wheezing or shortness of breath. 04/07/21   Spero Geralds, MD  albuterol (VENTOLIN HFA) 108 (90 Base) MCG/ACT inhaler Inhale 1-2 puffs into the lungs every 6 (six) hours as needed for wheezing or shortness of breath. 04/07/21   Spero Geralds, MD  amLODipine (NORVASC) 10 MG tablet Take 10 mg by mouth daily.    [provider]  atenolol (TENORMIN) 50 MG tablet Take 50 mg by mouth daily.    [provider]  budesonide-formoterol (SYMBICORT) 160-4.5 MCG/ACT inhaler Inhale 2 puffs into the lungs 2 (two) times daily. 04/20/21   Spero Geralds, MD  escitalopram (LEXAPRO) 20 MG tablet Take 1 tablet (20 mg total) by mouth daily. 12/22/20   Norman Clay, MD  fluticasone (FLONASE) 50 MCG/ACT nasal spray Place 1 spray into both nostrils daily for 14 days. 12/13/19 04/18/21  Avegno, Darrelyn Hillock, FNP  levocetirizine (XYZAL) 5 MG tablet Take 5 mg by mouth  every morning.     [provider]  montelukast (SINGULAIR) 10 MG tablet Take 10 mg by mouth at bedtime.    [provider]  ondansetron (ZOFRAN-ODT) 4 MG disintegrating tablet Take 1 tablet (4 mg total) by mouth every 8 (eight) hours as needed for nausea or vomiting. 06/03/21   Vanessa Kick, MD  promethazine (PHENERGAN) 25 MG tablet Take 0.5 tablets (12.5 mg total) by mouth every 6 (six) hours as needed for nausea or vomiting. 12/05/18   Annitta Needs, NP  SUMAtriptan (IMITREX) 100 MG tablet Take 1 tablet (100 mg total) by mouth once as needed for up to 1 dose for migraine. May repeat in 2 hours if headache persists or recurs. 06/22/21   Marcial Pacas, MD  venlafaxine XR (EFFEXOR XR) 37.5 MG 24 hr capsule Take 1 capsule (37.5 mg total) by mouth daily with breakfast. 06/22/21   Marcial Pacas, MD      Allergies    Codeine and Topiramate    Review of Systems   Review of Systems  Constitutional:        Per HPI, otherwise negative  HENT:         Per HPI, otherwise negative  Respiratory:         Per HPI, otherwise negative  Cardiovascular:        Per HPI, otherwise negative  Gastrointestinal:  Negative for vomiting.  Endocrine:       Negative aside from HPI  Genitourinary:  Neg aside from HPI   Musculoskeletal:        Per HPI, otherwise negative  Skin: Negative.   Neurological:  Negative for syncope.   Physical Exam Updated Vital Signs BP (!) 159/96 (BP Location: Right Wrist)    Pulse (!) 103    Temp 98.7 F (37.1 C) (Oral)    Resp 20    Ht 5\' 5"  (1.651 m)    Wt (!) 204.7 kg    LMP 11/08/2021 (Approximate)    SpO2 96%    BMI 75.10 kg/m  Physical Exam Vitals and nursing note reviewed.  Constitutional:      General: She is not in acute distress.    Appearance: She is well-developed. She is obese. She is not ill-appearing or diaphoretic.  HENT:     Head: Normocephalic and atraumatic.  Eyes:     Conjunctiva/sclera: Conjunctivae normal.  Cardiovascular:     Rate and  Rhythm: Normal rate and regular rhythm.  Pulmonary:     Effort: Pulmonary effort is normal. No respiratory distress.     Breath sounds: Normal breath sounds. No stridor.  Abdominal:     General: There is no distension.     Tenderness: There is abdominal tenderness in the right upper quadrant, epigastric area and periumbilical area.  Skin:    General: Skin is warm and dry.  Neurological:     Mental Status: She is alert and oriented to person, place, and time.     Cranial Nerves: No cranial nerve deficit.    ED Results / Procedures / Treatments   Labs (all labs ordered are listed, but only abnormal results are displayed) Labs Reviewed  COMPREHENSIVE METABOLIC PANEL - Abnormal; Notable for the following components:      Result Value   Calcium 8.7 (*)    All other components within normal limits  ETHANOL  LIPASE, BLOOD  CBC WITH DIFFERENTIAL/PLATELET    EKG None  Radiology CT Renal Stone Study  Result Date: 12/01/2021 CLINICAL DATA:  Right flank and lateral abdominal pain. EXAM: CT ABDOMEN AND PELVIS WITHOUT CONTRAST TECHNIQUE: Multidetector CT imaging of the abdomen and pelvis was performed following the standard protocol without IV contrast. RADIATION DOSE REDUCTION: This exam was performed according to the departmental dose-optimization program which includes automated exposure control, adjustment of the mA and/or kV according to patient size and/or use of iterative reconstruction technique. COMPARISON:  CT 05/15/2019 FINDINGS: Lower chest: No consolidation or pleural effusion. Chronic subpleural reticulation in the anterior right middle lobe. Hepatobiliary: Enlarged liver spanning 20 cm cranial caudal with diffuse steatosis. No focal hepatic abnormality on this unenhanced exam. Clips in the gallbladder fossa postcholecystectomy. No biliary dilatation. Pancreas: No ductal dilatation or inflammation. Spleen: Mildly enlarged, 13.4 cm cranial caudal. No focal abnormality.  Adrenals/Urinary Tract: Normal adrenal glands. No hydronephrosis or renal calculi. No definite perinephric edema, there is motion artifact through the mid lower right kidney. No focal renal abnormality is seen. Decompressed ureters without stones along the course. Unremarkable urinary bladder. Stomach/Bowel: Stomach is within normal limits. Appendix appears normal. No evidence of bowel wall thickening, distention, or inflammatory changes. Vascular/Lymphatic: Normal caliber abdominal aorta. No portal venous or mesenteric gas. Small central mesenteric lymph nodes with misty mesentery. No enlarged lymph nodes by size criteria. Reproductive: IUD is no longer present in the uterus. Small uterine fibroids. Normal appearance of the ovaries, no adnexal mass. Other: No free air, free fluid, or intra-abdominal fluid collection. No abdominal wall hernia. Musculoskeletal: Sclerosis about the iliac aspect  of both iliac bones. There are no acute or suspicious osseous abnormalities. IMPRESSION: 1. No renal stones or obstructive uropathy. 2. Hepatosplenomegaly and hepatic steatosis. Capsular stretching of the liver can contribute to right-sided pain. 3. IUD is no longer present in the uterus, recommend correlation with clinical history if it has been removed. Small uterine fibroids. Electronically Signed   By: Keith Rake M.D.   On: 12/01/2021 16:22    Procedures Procedures    Medications Ordered in ED Medications  ketorolac (TORADOL) 30 MG/ML injection 30 mg (30 mg Intramuscular Given 12/01/21 1608)    ED Course/ Medical Decision Making/ A&P [facility] differential including peritonitis, nephrolithiasis, hepatitis, enteritis, urinary tract infection considered.  Labs, CT ordered.  4:59 PM I have interpreted the CT, who reviewed all labs, consistent with hepatomegaly, and discussed this with the patient she notes that she has previously had abdominal pain similar to this, has seen multiple GI service, but has been  unsatisfied with their evaluation. Without other evidence for acute new pathology, including evidence for peritonitis, urinary tract infection, nephrolithiasis, low suspicion for bacteremia, sepsis patient appropriate for discharge and close outpatient follow-up with GI.                         Medical Decision Making Amount and/or Complexity of Data Reviewed External Data Reviewed: radiology and notes.    Details: abd pel ct 202 no hepatomegaly Labs: ordered. Decision-making details documented in ED Course. Radiology: ordered and independent interpretation performed. Decision-making details documented in ED Course.    Details: Interpretation notable for hepatomegaly  Risk Prescription drug management. Decision regarding hospitalization.  Critical Care Total time providing critical care: < 30 minutes         Final Clinical Impression(s) / ED Diagnoses Final diagnoses:  Generalized abdominal pain     Carmin Muskrat, MD 12/01/21 [phone]

## 2021-12-02 ENCOUNTER — Other Ambulatory Visit: Payer: Self-pay

## 2021-12-02 ENCOUNTER — Ambulatory Visit: Payer: 59 | Admitting: Psychiatry

## 2021-12-02 ENCOUNTER — Encounter: Payer: Self-pay | Admitting: Psychiatry

## 2021-12-02 VITALS — BP 178/104 | HR 95 | Ht 65.0 in | Wt >= 6400 oz

## 2021-12-02 DIAGNOSIS — M2550 Pain in unspecified joint: Secondary | ICD-10-CM

## 2021-12-02 DIAGNOSIS — G43719 Chronic migraine without aura, intractable, without status migrainosus: Secondary | ICD-10-CM

## 2021-12-02 MED ORDER — AIMOVIG 70 MG/ML ~~LOC~~ SOAJ: 1.0000 "pen " | SUBCUTANEOUS | 3 refills | Status: DC

## 2021-12-02 MED ORDER — PREGABALIN 75 MG PO CAPS: 75.0000 mg | ORAL_CAPSULE | Freq: Two times a day (BID) | ORAL | 3 refills | Status: DC

## 2021-12-02 NOTE — Progress Notes (Signed)
Referring:  Caren Macadam, MD [address] [place],  [place] [address]  PCP: Caren Macadam, MD  Neurology was asked to evaluate Anita Hodges, a 36 year old female for a chief complaint of headaches.  Our recommendations of care will be communicated by shared medical record.    CC:  headaches  HPI:  Medical co-morbidities: IIH (diagnosed 2020 via LP), asthma, HTN  The patient presents for evaluation of headaches. States she developed gradually worsening headaches in 2020. These were associated with photophobia, phonophobia, and blurred vision. MRIMRV brain were unremarkable. She was found to have elevated opening pressure via LP (OP 31) and was diagnosed with IIH. She tried Topamax and Diamox, but was unable to tolerate these due to side effects.  Fundus photography in February 2021 showed no papilledema. Visual field testing showed enlarged blind spots in both eyes.  She saw neurosurgery for possible venous stent placement. 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 She was scheduled for venous stent placement, but surgery was postponed last minute and she decided she did not want to pursue it.  States her last eye exam in July 2022 did not show papilledema.  Headaches are constant, daily pressure and throbbing with associated photophobia, phonophobia, and nausea. Denies vision changes. Imitrex does help reduce the pain of her headaches, but she only takes this on the weekends because she is afraid it will make her drowsy. She is not sure if it causes side effects because she usually takes it and then goes to sleep.  States she wakes up with whole body pain every day. Keeps missing work due to pain. She has seen Rheumatology but did not like the office she was going to and was lost to follow up.  Headache History: Onset: 2020 Triggers: smells Aura: none Location: occiput, temples, neck Quality/Description: pressure, throbbing Associated  Symptoms:  Photophobia: yes  Phonophobia: yes  Nausea: yes Worse with activity?: yes Duration of headaches: constant  Headache days per month: 30 Headache free days per month: 0  Current Treatment: Abortive Imitrex 100 mg PRN  Preventative none  Prior Therapies                                 Topamax 100 mg BID - mood swings Diamox - side effects Lexapro Cymbalta 60 mg daily Atenolol 50 mg QHS Emgality 120 mg monthly Imitrex  Headache Risk Factors: Headache risk factors and/or co-morbidities (+) Neck Pain (+) Back Pain (+) Sleep Disorder - tossing and turning because of pain, states PSG was normal (+) Obesity  Body mass index is 75.05 kg/m.  LABS: CBC    Component Value Date/Time   WBC 9.5 12/01/2021 1537   RBC 4.58 12/01/2021 1537   HGB 13.1 12/01/2021 1537   HGB 13.6 06/03/2021 1841   HCT 39.8 12/01/2021 1537   HCT 41.6 06/03/2021 1841   PLT 337 12/01/2021 1537   PLT 384 06/03/2021 1841   MCV 86.9 12/01/2021 1537   MCV 82 06/03/2021 1841   MCH 28.6 12/01/2021 1537   MCHC 32.9 12/01/2021 1537   RDW 15.5 12/01/2021 1537   RDW 14.1 06/03/2021 1841   LYMPHSABS 2.7 12/01/2021 1537   LYMPHSABS 3.1 06/03/2021 1841   MONOABS 0.4 12/01/2021 1537   EOSABS 0.2 12/01/2021 1537   EOSABS 0.2 06/03/2021 1841   BASOSABS 0.1 12/01/2021 1537   BASOSABS 0.0 06/03/2021 1841  CMP Latest Ref Rng & Units 12/01/2021 06/03/2021 07/03/2019  Glucose 70 - 99 mg/dL 97 89 102(H)  BUN 6 - 20 mg/dL 8 9 10   Creatinine 0.44 - 1.00 mg/dL 0.57 0.59 0.70  Sodium 135 - 145 mmol/L 138 137 137  Potassium 3.5 - 5.1 mmol/L 3.9 4.5 4.0  Chloride 98 - 111 mmol/L 100 101 104  CO2 22 - 32 mmol/L 28 21 26   Calcium 8.9 - 10.3 mg/dL 8.7(L) 8.6(L) 8.8(L)  Total Protein 6.5 - 8.1 g/dL 7.0 6.6 7.3  Total Bilirubin 0.3 - 1.2 mg/dL 0.8 0.3 0.6  Alkaline Phos 38 - 126 U/L 92 119 97  AST 15 - 41 U/L 30 22 28   ALT 0 - 44 U/L 34 21 37     IMAGING:  MRI/MRV brain 2020: unremarkable  Current  Outpatient Medications on File Prior to Visit  Medication Sig Dispense Refill   albuterol (PROVENTIL) (2.5 MG/3ML) 0.083% nebulizer solution Take 3 mLs (2.5 mg total) by nebulization every 6 (six) hours as needed for wheezing or shortness of breath. 75 mL 2   albuterol (VENTOLIN HFA) 108 (90 Base) MCG/ACT inhaler Inhale 1-2 puffs into the lungs every 6 (six) hours as needed for wheezing or shortness of breath. 18 g 0   amLODipine (NORVASC) 10 MG tablet Take 10 mg by mouth daily.     atenolol (TENORMIN) 50 MG tablet Take 50 mg by mouth daily.     budesonide-formoterol (SYMBICORT) 160-4.5 MCG/ACT inhaler Inhale 2 puffs into the lungs 2 (two) times daily. 1 each 6   escitalopram (LEXAPRO) 20 MG tablet Take 1 tablet (20 mg total) by mouth daily. 30 tablet 1   levocetirizine (XYZAL) 5 MG tablet Take 5 mg by mouth every morning.      montelukast (SINGULAIR) 10 MG tablet Take 10 mg by mouth at bedtime.     ondansetron (ZOFRAN-ODT) 4 MG disintegrating tablet Take 1 tablet (4 mg total) by mouth every 8 (eight) hours as needed for nausea or vomiting. 15 tablet 0   promethazine (PHENERGAN) 25 MG tablet Take 0.5 tablets (12.5 mg total) by mouth every 6 (six) hours as needed for nausea or vomiting. 30 tablet 0   SUMAtriptan (IMITREX) 100 MG tablet Take 1 tablet (100 mg total) by mouth once as needed for up to 1 dose for migraine. May repeat in 2 hours if headache persists or recurs. 12 tablet 6   fluticasone (FLONASE) 50 MCG/ACT nasal spray Place 1 spray into both nostrils daily for 14 days. 16 g 0   No current facility-administered medications on file prior to visit.     Allergies: Allergies  Allergen Reactions   Codeine Other (See Comments)    DIZZINESS AND COLD (SICK)   Topiramate Other (See Comments)    Cognitive changes, mood swings    Family History: Migraine or other headaches in the family:  mother, sister, grandmother, cousin, aunt Aneurysms in a first degree relative:  no Brain tumors in  the family:  no Other neurological illness in the family:   no  Past Medical History: Past Medical History:  Diagnosis Date   Allergy    Anxiety    Hypertension    IIH (idiopathic intracranial hypertension)    Migraine    Obesity     Past Surgical History Past Surgical History:  Procedure Laterality Date   CHOLECYSTECTOMY N/A 12/27/2018   Procedure: LAPAROSCOPIC CHOLECYSTECTOMY;  Surgeon: Aviva Signs, MD;  Location: [facility];  Service: General;  Laterality: N/A;  pt knows to arrive at 6:15   COLONOSCOPY WITH PROPOFOL N/A 07/23/2019   Procedure: COLONOSCOPY WITH PROPOFOL;  Surgeon: Daneil Dolin, MD;  Location: [facility];  Service: Endoscopy;  Laterality: N/A;  1:15pm   ESOPHAGOGASTRODUODENOSCOPY (EGD) WITH PROPOFOL N/A 12/18/2018   mild erosive reflux esophagitis, normal stomach, normal duodenum.    POLYPECTOMY  07/23/2019   Procedure: POLYPECTOMY;  Surgeon: Daneil Dolin, MD;  Location: [facility];  Service: Endoscopy;;   TONSILLECTOMY     WISDOM TOOTH EXTRACTION      Social History: Social History   Tobacco Use   Smoking status: Never   Smokeless tobacco: Never  Vaping Use   Vaping Use: Never used  Substance Use Topics   Alcohol use: No    Alcohol/week: 0.0 standard drinks   Drug use: No    ROS: Negative for fevers, chills. Positive for headaches, body pain. All other systems reviewed and negative unless stated otherwise in HPI.   Physical Exam:   Vital Signs: BP (!) 178/104    Pulse 95    Ht 5\' 5"  (1.651 m)    Wt (!) 451 lb (204.6 kg)    LMP 11/08/2021 (Approximate)    BMI 75.05 kg/m  GENERAL: well appearing,in no acute distress,alert SKIN:  Color, texture, turgor normal. No rashes or lesions HEAD:  Normocephalic/atraumatic. CV:  RRR RESP: Normal respiratory effort MSK: +tenderness to palpation over bilateral neck and shoulders  NEUROLOGICAL: Mental Status: Alert, oriented to person, place and time,Follows commands Cranial Nerves: PERRL, no  papilledema visualized, visual fields intact to confrontation,extraocular movements intact,facial sensation intact,no facial droop or ptosis,hearing grossly intact,no dysarthria Motor: muscle strength 5/5 both upper and lower extremities,no drift, normal tone Reflexes: 2+ throughout Sensation: intact to light touch all 4 extremities Coordination: Finger-to- nose-finger intact bilaterally,Heel-to-shin intact bilaterally Gait: normal-based   IMPRESSION: 36 year old female with a history of IIH (diagnosed 2020 via LP), asthma, HTN who presents for evaluation of daily headaches. Suspect her headaches are secondary to chronic migraine as she does not have evidence of papilledema to suggest active IIH. She has tried and failed multiple medications for headache prevention. Discussed treatment options including CGRP and Botox. Will try Aimovig for prevention [place] and Botox would be less convenient for her. Will also start Lyrica which may help with her body pain as well as her headaches. She would like to re-establish with Rheumatology, referral placed at her request.  PLAN: -Prevention: Start Aimovig 70 mg daily. Start Lyrica 75 mg BID. -Rescue: Continue Imitrex 100 mg PRN -Next steps: Consider Botox, qulipta, gabapentin   I spent a total of 47 minutes chart reviewing and counseling the patient. Headache education was done. Discussed treatment options including preventive and acute medications. Discussed medication overuse headache and to limit use of acute treatments to no more than 2 days/week or 10 days/month. Discussed medication side effects, adverse reactions and drug interactions. Written educational materials and patient instructions outlining all of the above were given.  Follow-up: 3 months   Genia Harold, MD 12/02/2021   4:37 PM

## 2021-12-02 NOTE — Patient Instructions (Addendum)
Start Aimovig 70 mg monthly for migraine prevention Continue Imitrex 100 mg as needed for migraine Start Lyrica 75 mg twice a day for body pain Referral to Rheumatology

## 2021-12-03 ENCOUNTER — Telehealth: Payer: Self-pay | Admitting: *Deleted

## 2021-12-03 ENCOUNTER — Telehealth: Payer: Self-pay

## 2021-12-03 NOTE — Telephone Encounter (Signed)
Documents attached, PA sent to plan.  Your information has been submitted to [facility].  If [employer] has not responded to your request within 24 hours, contact [facility] at [phone].

## 2021-12-03 NOTE — Telephone Encounter (Signed)
PA for Pregabalin 75 mg has been sent.    (Key: [id number])  Your information has been submitted to [facility]. To check for an updated outcome later, reopen this PA request from your dashboard.  If [employer] has not responded to your request within 24 hours, contact [facility] at [phone]. If you think there may be a problem with your PA request, use our live chat feature at the bottom right.

## 2021-12-03 NOTE — Telephone Encounter (Signed)
Aimovig PA, key [id number], [id number]. faxed office note to be attached to PA.

## 2021-12-03 NOTE — Telephone Encounter (Signed)
Aimovig approved 12/03/21-12/03/2022. Faxed approval letter to pharmacy.

## 2021-12-07 NOTE — Telephone Encounter (Signed)
Pregabalin approved from 12/03/2021 to 12/03/2022. Approval letter faxed to pharmacy.

## 2021-12-23 ENCOUNTER — Other Ambulatory Visit: Payer: Self-pay | Admitting: Psychiatry

## 2021-12-23 ENCOUNTER — Encounter: Payer: Self-pay | Admitting: Psychiatry

## 2021-12-23 DIAGNOSIS — M2550 Pain in unspecified joint: Secondary | ICD-10-CM

## 2021-12-23 NOTE — Telephone Encounter (Signed)
I placed a new referral, thanks

## 2021-12-23 NOTE — Telephone Encounter (Signed)
I never got the denial from the office.. but I just looked on the old referral and it stated "We appreciate your referral. Each referral is reviewed by our providers to ensure that the patient will receive the best medical care possible. Upon review of this referral Dr. Estanislado Pandy and Dr. Benjamine Mola have declined it.  We do not treat Chronic Pain Syndrome. "   Can you put a new referral in and I will try [facility] Rheumatology

## 2022-02-17 ENCOUNTER — Encounter: Payer: Self-pay | Admitting: Psychiatry

## 2022-02-17 ENCOUNTER — Other Ambulatory Visit: Payer: Self-pay | Admitting: Psychiatry

## 2022-02-17 MED ORDER — METHOCARBAMOL 500 MG PO TABS: 500.0000 mg | ORAL_TABLET | Freq: Three times a day (TID) | ORAL | 3 refills | Status: AC | PRN

## 2022-02-17 MED ORDER — AIMOVIG 140 MG/ML ~~LOC~~ SOAJ: 140.0000 mg | SUBCUTANEOUS | 3 refills | Status: DC

## 2022-02-18 ENCOUNTER — Other Ambulatory Visit: Payer: Self-pay | Admitting: Psychiatry

## 2022-02-18 MED ORDER — AIMOVIG 70 MG/ML ~~LOC~~ SOAJ: 2.0000 "pen " | SUBCUTANEOUS | 3 refills | Status: DC

## 2022-02-23 ENCOUNTER — Encounter: Payer: Self-pay | Admitting: *Deleted

## 2022-02-25 ENCOUNTER — Telehealth: Payer: Self-pay | Admitting: Adult Health

## 2022-02-25 ENCOUNTER — Ambulatory Visit: Payer: 59 | Admitting: Adult Health

## 2022-02-25 ENCOUNTER — Encounter: Payer: Self-pay | Admitting: Adult Health

## 2022-02-25 VITALS — BP 184/107 | HR 114 | Ht 65.0 in | Wt >= 6400 oz

## 2022-02-25 DIAGNOSIS — G932 Benign intracranial hypertension: Secondary | ICD-10-CM

## 2022-02-25 DIAGNOSIS — G43719 Chronic migraine without aura, intractable, without status migrainosus: Secondary | ICD-10-CM

## 2022-02-25 DIAGNOSIS — M2550 Pain in unspecified joint: Secondary | ICD-10-CM | POA: Diagnosis not present

## 2022-02-25 MED ORDER — RIZATRIPTAN BENZOATE 10 MG PO TBDP: 10.0000 mg | ORAL_TABLET | ORAL | 11 refills | Status: DC | PRN

## 2022-02-25 MED ORDER — ONABOTULINUMTOXINA 200 UNITS IJ SOLR: 155.0000 [IU] | INTRAMUSCULAR | 0 refills | Status: DC

## 2022-02-25 MED ORDER — PREGABALIN 150 MG PO CAPS: 150.0000 mg | ORAL_CAPSULE | Freq: Two times a day (BID) | ORAL | 5 refills | Status: DC

## 2022-02-25 NOTE — Telephone Encounter (Signed)
Please start process for Botox.  Thank you. ?

## 2022-02-25 NOTE — Progress Notes (Signed)
? ?Referring:  ?Caren Macadam, MD ?[address] ?[place],  [place] [address] ? ?PCP: ?Caren Macadam, MD ? ?Neurology was asked to evaluate Anita Hodges, a 36 year old female for a chief complaint of headaches.  Our recommendations of care will be communicated by shared medical record.   ? ?CC:  headaches ?Chief Complaint  ?Patient presents with  ? Migraine  ?  Rm 3  early FU for increased migraines  " have taken 2 doses of Aimovig 140 mg, was on 70 mg before"  ?  ? ? ?HPI:  ? ?Update 02/25/2022 JM: Patient returns for acute visit due to continued daily headaches as well as needing a work note as she missed the last 3 days of work. ? ?She reports continued daily posterior/frontal headaches but will experience at least 1 severe migraine per week with mood changes the day prior, then will experience severe migraine with photophobia, phonophobia and nausea which can last 3 days and then fatigue for at least 24 hours after.  These migraines are debilitating and is unable to work at this time.  She has 30 headache days per month and at minimum 20 migrainous days. ? ?She has had 2 Aimovig injections but has not noticed much benefit ?She has remained on pregabalin 75 mg twice daily with some improvement of generalized pain but nothing significant. ? ?Last aimovig injection 2 weeks ago ?She will use sumatriptan but unable to use unless she is at home due to GI side effects and fatigue. She has not previously trialed other triptans that she can remember. ?Continued lyrica 75 mg BID ?She has not yet been seen by rheumatology - she was referred to [facility] rheumatology and about 3 weeks ago, signed a release of information for their office to review but has not yet heard from them. She reports she has tried to call them multiple times but unable to speak with anyone.  ? ?She is understandably very frustrated as this has been going on for the past 3 years.  She feels like she is going to lose her job if she continues to  have to miss work. ? ? ? ? ? ?History copied from Dr. Georgina Peer prior [place] note for reference purposes only ?Medical co-morbidities: IIH (diagnosed 2020 via LP), asthma, HTN ? ?The patient presents for evaluation of headaches. States she developed gradually worsening headaches in 2020. These were associated with photophobia, phonophobia, and blurred vision. MRIMRV brain were unremarkable. She was found to have elevated opening pressure via LP (OP 31) and was diagnosed with IIH. She tried Topamax and Diamox, but was unable to tolerate these due to side effects. ? ?Fundus photography in February 2021 showed no papilledema. Visual field testing showed enlarged blind spots in both eyes. ? ?She saw neurosurgery for possible venous stent placement. 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 She was scheduled for venous stent placement, but surgery was postponed last minute and she decided she did not want to pursue it. ? ?States her last eye exam in July 2022 did not show papilledema. ? ?Headaches are constant, daily pressure and throbbing with associated photophobia, phonophobia, and nausea. Denies vision changes. Imitrex does help reduce the pain of her headaches, but she only takes this on the weekends because she is afraid it will make her drowsy. She is not sure if it causes side effects because she usually takes it and then goes to sleep. ? ?States she wakes up with  whole body pain every day. Keeps missing work due to pain. She has seen Rheumatology but did not like the office she was going to and was lost to follow up. ? ? ? ? ?Current Treatment: ?Abortive ?Imitrex 100 mg PRN ? ?Preventative ?Aimovig ? ?Prior Therapies                                 ?Topamax 100 mg BID - mood swings ?Diamox - side effects ?Lexapro ?Cymbalta 60 mg daily ?Atenolol 50 mg QHS ?Emgality 120 mg monthly ?Aimovig ?Imitrex ? ?Headache Risk Factors: ?Headache risk factors and/or co-morbidities ?(+) Neck  Pain ?(+) Back Pain ?(+) Sleep Disorder - tossing and turning because of pain, states PSG was normal ?(+) Obesity  Body mass index is 75.45 kg/m?. ? ?LABS: ?CBC ?   ?Component Value Date/Time  ? WBC 9.5 12/01/2021 1537  ? RBC 4.58 12/01/2021 1537  ? HGB 13.1 12/01/2021 1537  ? HGB 13.6 06/03/2021 1841  ? HCT 39.8 12/01/2021 1537  ? HCT 41.6 06/03/2021 1841  ? PLT 337 12/01/2021 1537  ? PLT 384 06/03/2021 1841  ? MCV 86.9 12/01/2021 1537  ? MCV 82 06/03/2021 1841  ? MCH 28.6 12/01/2021 1537  ? MCHC 32.9 12/01/2021 1537  ? RDW 15.5 12/01/2021 1537  ? RDW 14.1 06/03/2021 1841  ? LYMPHSABS 2.7 12/01/2021 1537  ? LYMPHSABS 3.1 06/03/2021 1841  ? MONOABS 0.4 12/01/2021 1537  ? EOSABS 0.2 12/01/2021 1537  ? EOSABS 0.2 06/03/2021 1841  ? BASOSABS 0.1 12/01/2021 1537  ? BASOSABS 0.0 06/03/2021 1841  ? ? ?  Latest Ref Rng & Units 12/01/2021  ?  3:37 PM 06/03/2021  ?  6:41 PM 07/03/2019  ?  4:41 PM  ?CMP  ?Glucose 70 - 99 mg/dL 97   89   102    ?BUN 6 - 20 mg/dL '8   9   10    '$ ?Creatinine 0.44 - 1.00 mg/dL 0.57   0.59   0.70    ?Sodium 135 - 145 mmol/L 138   137   137    ?Potassium 3.5 - 5.1 mmol/L 3.9   4.5   4.0    ?Chloride 98 - 111 mmol/L 100   101   104    ?CO2 22 - 32 mmol/L '28   21   26    '$ ?Calcium 8.9 - 10.3 mg/dL 8.7   8.6   8.8    ?Total Protein 6.5 - 8.1 g/dL 7.0   6.6   7.3    ?Total Bilirubin 0.3 - 1.2 mg/dL 0.8   0.3   0.6    ?Alkaline Phos 38 - 126 U/L 92   119   97    ?AST 15 - 41 U/L '30   22   28    '$ ?ALT 0 - 44 U/L 34   21   37    ? ? ? ?IMAGING:  ?MRI/MRV brain 2020: unremarkable ? ?Current Outpatient Medications on File Prior to Visit  ?Medication Sig Dispense Refill  ? albuterol (PROVENTIL) (2.5 MG/3ML) 0.083% nebulizer solution Take 3 mLs (2.5 mg total) by nebulization every 6 (six) hours as needed for wheezing or shortness of breath. 75 mL 2  ? albuterol (VENTOLIN HFA) 108 (90 Base) MCG/ACT inhaler Inhale 1-2 puffs into the lungs every 6 (six) hours as needed for wheezing or shortness of breath. 18 g 0  ?  amLODipine (NORVASC)  10 MG tablet Take 10 mg by mouth daily.    ? atenolol (TENORMIN) 50 MG tablet Take 50 mg by mouth daily.    ? budesonide-formoterol (SYMBICORT) 160-4.5 MCG/ACT inhaler Inhale 2 puffs into the lungs 2 (two) times daily. 1 each 6  ? [START ON 03/20/2022] Erenumab-aooe (AIMOVIG) 70 MG/ML SOAJ Inject 2 pens. into the skin every 30 (thirty) days. 1.12 mL 3  ? levocetirizine (XYZAL) 5 MG tablet Take 5 mg by mouth every morning.     ? methocarbamol (ROBAXIN) 500 MG tablet Take 1 tablet (500 mg total) by mouth every 8 (eight) hours as needed for muscle spasms. 90 tablet 3  ? montelukast (SINGULAIR) 10 MG tablet Take 10 mg by mouth at bedtime.    ? ondansetron (ZOFRAN-ODT) 4 MG disintegrating tablet Take 1 tablet (4 mg total) by mouth every 8 (eight) hours as needed for nausea or vomiting. 15 tablet 0  ? pregabalin (LYRICA) 75 MG capsule Take 1 capsule (75 mg total) by mouth 2 (two) times daily. 60 capsule 3  ? promethazine (PHENERGAN) 25 MG tablet Take 0.5 tablets (12.5 mg total) by mouth every 6 (six) hours as needed for nausea or vomiting. 30 tablet 0  ? SUMAtriptan (IMITREX) 100 MG tablet Take 1 tablet (100 mg total) by mouth once as needed for up to 1 dose for migraine. May repeat in 2 hours if headache persists or recurs. 12 tablet 6  ? venlafaxine XR (EFFEXOR-XR) 37.5 MG 24 hr capsule Take 37.5 mg by mouth every morning.    ? escitalopram (LEXAPRO) 20 MG tablet Take 1 tablet (20 mg total) by mouth daily. 30 tablet 1  ? fluticasone (FLONASE) 50 MCG/ACT nasal spray Place 1 spray into both nostrils daily for 14 days. 16 g 0  ? ?No current facility-administered medications on file prior to visit.  ? ? ? ?Allergies: ?Allergies  ?Allergen Reactions  ? Codeine Other (See Comments)  ?  DIZZINESS AND COLD (SICK)  ? Topiramate Other (See Comments)  ?  Cognitive changes, mood swings  ? ? ?Family History: ?Migraine or other headaches in the family:  mother, sister, grandmother, cousin, aunt ?Aneurysms in a  first degree relative:  no ?Brain tumors in the family:  no ?Other neurological illness in the family:   no ? ?Past Medical History: ?Past Medical History:  ?Diagnosis Date  ? Allergy   ? Anxiety   ? Hypertensi

## 2022-02-25 NOTE — Patient Instructions (Addendum)
Your Plan: ? ?Lets try to see if botox will be approved to help with continued headaches  ? ?Increase lyrica to '150mg'$  twice daily  ? ?Look in to [facility] to do dry needling  ? ?Trial Rizatriptan for abortive therapy  ? ? ? ? ? ? ? ?Thank you for coming to see [place] at [facility]. I hope we have been able to provide you high quality care today. ? ?You may receive a patient satisfaction survey over the next few weeks. We would appreciate your feedback and comments so that we may continue to improve ourselves and the health of our patients. ? ?

## 2022-03-01 ENCOUNTER — Encounter: Payer: Self-pay | Admitting: Adult Health

## 2022-03-01 ENCOUNTER — Telehealth: Payer: Self-pay | Admitting: Adult Health

## 2022-03-01 NOTE — Telephone Encounter (Signed)
Completed [employer] PA form, placed in Nurse Pod for MD signature. ?

## 2022-03-01 NOTE — Telephone Encounter (Signed)
Called [employer] spoke with Anita Hodges she states CPT code [phone] required a PA but [phone] does not require one. She states patient can be Human resources officer, reference # for today's call [id number]. ?

## 2022-03-02 NOTE — Telephone Encounter (Signed)
Faxed signed PA form with OV notes to [employer].  

## 2022-03-03 ENCOUNTER — Ambulatory Visit: Payer: 59 | Admitting: Psychiatry

## 2022-03-03 ENCOUNTER — Encounter: Payer: Self-pay | Admitting: Adult Health

## 2022-03-03 DIAGNOSIS — L03115 Cellulitis of right lower limb: Secondary | ICD-10-CM | POA: Diagnosis not present

## 2022-03-03 NOTE — Telephone Encounter (Signed)
Can a letter please be provided excusing Anita Hodges from work from 4/24 - 4/26 due to migraine headache (can be similar to prior note provided). Should be able to send thru MyChart unless she wants to pick it up. Thank you.  ?

## 2022-03-08 ENCOUNTER — Ambulatory Visit: Payer: 59 | Admitting: Psychiatry

## 2022-03-09 ENCOUNTER — Encounter: Payer: Self-pay | Admitting: Psychiatry

## 2022-03-09 NOTE — Telephone Encounter (Signed)
Will forward to Dr. Billey Gosling now that she is back in office. I believe this would be under responsibility PCP to provide letter if out of work for sinus infection but Dr. Billey Gosling can clarify.  ? ?Dr. Billey Gosling, please review prior phone calls and OV note. She continues to have migraines where she is continuously out of work and requesting a letter excusing her for such. Trying to get botox approved at this point to see if this helps but maybe you have other recommendations?  ?

## 2022-03-09 NOTE — Telephone Encounter (Signed)
She was out of work due to sinus infection, we will not be excusing her for that, correct? We excused her last week due to her migraines. Should advise to get letter from PCP or who treated her for infection.  ?

## 2022-03-09 NOTE — Telephone Encounter (Signed)
I think Botox is a good idea since she's already failed so many other medications. Could always try something like Qulipta, but it may be easier to get Botox approved. In terms of the work letter for sinus infection that should fall under PCP's responsibility.

## 2022-03-25 DIAGNOSIS — R946 Abnormal results of thyroid function studies: Secondary | ICD-10-CM | POA: Diagnosis not present

## 2022-05-12 ENCOUNTER — Telehealth: Payer: Self-pay | Admitting: *Deleted

## 2022-05-12 ENCOUNTER — Ambulatory Visit: Payer: 59 | Admitting: Psychiatry

## 2022-05-12 ENCOUNTER — Ambulatory Visit (INDEPENDENT_AMBULATORY_CARE_PROVIDER_SITE_OTHER): Payer: 59 | Admitting: Psychiatry

## 2022-05-12 VITALS — BP 182/109 | HR 93 | Ht 65.0 in | Wt >= 6400 oz

## 2022-05-12 DIAGNOSIS — G43719 Chronic migraine without aura, intractable, without status migrainosus: Secondary | ICD-10-CM

## 2022-05-12 DIAGNOSIS — M542 Cervicalgia: Secondary | ICD-10-CM

## 2022-05-12 MED ORDER — UBRELVY 100 MG PO TABS: 100.0000 mg | ORAL_TABLET | ORAL | 6 refills | Status: AC | PRN

## 2022-05-12 NOTE — Telephone Encounter (Signed)
Roselyn Meier PA,  Key: [id number] , [id number]. 05/13/22 faxed office notes to be attached to key.

## 2022-05-12 NOTE — Progress Notes (Signed)
   CC:  headaches  Follow-up Visit  Last visit: 02/25/22  Brief HPI: 36 year old female with a history of IIH (OP 31 in 2020), asthma, HTN who follows in clinic for headaches.  At her last visit Lyrica was increased to 150 mg BID and she was planned to start Botox for migraine prevention. Maxalt was started for rescue.  Interval History: She continues to have a headache almost every day. She has still not heard anything from insurance regarding Botox. Rizatriptan helps but upsets her stomach.  Lyrica helps a little with her body pains but she does continue to suffer from generalized body pain daily. Never heard back from Rheumatology.  Last ophtho visit was 6-7 months ago and no papilledema was noted at that time.  Headache days per month: 30 Headache free days per month: 0  Current Headache Regimen: Preventative: Lyrica 150 mg BID Abortive: Maxalt 10 mg PRN  Prior Therapies                                  Topamax 100 mg BID - mood swings Diamox - side effects Lexapro Cymbalta 60 mg daily Atenolol 50 mg QHS Lyrica 150 mg BID Emgality 120 mg monthly Aimovig - lack of efficacy Imitrex - side effects Maxalt - side effects  Physical Exam:   Vital Signs: BP (!) 182/109   Pulse 93   Ht '5\' 5"'$  (1.651 m)   Wt (!) 460 lb (208.7 kg)   BMI 76.55 kg/m  GENERAL:  well appearing, in no acute distress, alert  SKIN:  Color, texture, turgor normal. No rashes or lesions HEAD:  Normocephalic/atraumatic. RESP: normal respiratory effort MSK:  No gross joint deformities.   NEUROLOGICAL: Mental Status: Alert, oriented to person, place and time, Follows commands, and Speech fluent and appropriate. Cranial Nerves: PERRL, face symmetric, no dysarthria, hearing grossly intact Motor: moves all extremities equally Gait: normal-based.  IMPRESSION: 36 year old female with a history of IIH (OP 31 in 2020), asthma, HTN who presents for follow up of chronic migraines. She has still not heard  anything regarding her insurance and Botox. Will reach out to Botox team to attempt to expedite this. Will switch Maxalt to [place] as she has been unable to tolerate 2 triptans at this point.  PLAN: -Prevention: Continue Lyrica 150 mg BID. Start Botox -Rescue: Start Ubrelvy 100 mg PRN -Referral to neck PT for cervicalgia   Follow-up: for Botox  I spent a total of 26 minutes on the date of the service. Headache education was done. Discussed medication side effects, adverse reactions and drug interactions. Written educational materials and patient instructions outlining all of the above were given.  Genia Harold, MD 05/12/22 4:09 PM

## 2022-05-13 ENCOUNTER — Telehealth: Payer: Self-pay | Admitting: Psychiatry

## 2022-05-13 DIAGNOSIS — G8929 Other chronic pain: Secondary | ICD-10-CM

## 2022-05-13 NOTE — Telephone Encounter (Signed)
Ubrelvy approved 05/13/2022 to 05/14/2023. Approval letter faxed to pharmacy.

## 2022-05-13 NOTE — Telephone Encounter (Signed)
I resent referral for Rheumatology to [facility] [phone].

## 2022-05-13 NOTE — Telephone Encounter (Signed)
Received e mail: documents attached. PA sent to plan.  Your information has been submitted to [place].  If [employer] has not responded to your request within 24 hours, contact [place] at [phone].

## 2022-05-19 NOTE — Telephone Encounter (Addendum)
Referral for Physical Therapy sent to [facility] [phone].

## 2022-05-24 NOTE — Telephone Encounter (Signed)
Pt called states [facility]ology will not see her, I have resent referral for Rheumatology to [facility] [phone].

## 2022-06-17 DIAGNOSIS — R7303 Prediabetes: Secondary | ICD-10-CM | POA: Diagnosis not present

## 2022-06-17 DIAGNOSIS — I10 Essential (primary) hypertension: Secondary | ICD-10-CM | POA: Diagnosis not present

## 2022-06-17 DIAGNOSIS — R5383 Other fatigue: Secondary | ICD-10-CM | POA: Diagnosis not present

## 2022-06-17 DIAGNOSIS — E8889 Other specified metabolic disorders: Secondary | ICD-10-CM | POA: Diagnosis not present

## 2022-06-17 DIAGNOSIS — R69 Illness, unspecified: Secondary | ICD-10-CM | POA: Diagnosis not present

## 2022-06-17 DIAGNOSIS — F331 Major depressive disorder, recurrent, moderate: Secondary | ICD-10-CM | POA: Diagnosis not present

## 2022-06-17 DIAGNOSIS — Z6841 Body Mass Index (BMI) 40.0 and over, adult: Secondary | ICD-10-CM | POA: Diagnosis not present

## 2022-06-17 DIAGNOSIS — Z8639 Personal history of other endocrine, nutritional and metabolic disease: Secondary | ICD-10-CM | POA: Diagnosis not present

## 2022-06-29 NOTE — Telephone Encounter (Signed)
Pt called stating that the last two Rheumatologist she has been referred to, have declined her referral. Pt would like to know where else she can be referred to that treats Chronic Pain. Please advise.

## 2022-07-07 DIAGNOSIS — R946 Abnormal results of thyroid function studies: Secondary | ICD-10-CM | POA: Diagnosis not present

## 2022-07-07 DIAGNOSIS — K625 Hemorrhage of anus and rectum: Secondary | ICD-10-CM | POA: Diagnosis not present

## 2022-07-09 NOTE — Telephone Encounter (Signed)
It looks like our office has not been able to find a rheumatologist for this patient since we started in January, can we change this to a chronic pain referral?

## 2022-07-13 ENCOUNTER — Telehealth: Payer: Self-pay | Admitting: Psychiatry

## 2022-07-13 DIAGNOSIS — G43719 Chronic migraine without aura, intractable, without status migrainosus: Secondary | ICD-10-CM

## 2022-07-13 MED ORDER — AIMOVIG 70 MG/ML ~~LOC~~ SOAJ: 2.0000 "pen " | SUBCUTANEOUS | 3 refills | Status: DC

## 2022-07-13 NOTE — Telephone Encounter (Signed)
Called [employer], spoke with Kenton Kingfisher to check status of Botox PA submitted in April . He stated the PA form had dispensing provider information missing. He took information, he processed request, stated they had all information required. He will send to precert review within 15 days for standard time frame however will send for faster review. Decision via fax.  Case [id number].  Called patient, advised her of botox status. She stated she needs refill of Aimovig.  She stated that she has continued taking monthly injections even though they are not totally effective because she hasn't gotten started on Botox yet. . She uses Ubrelvy as needed which does help.  I advised will refill Aimovig and I;ll update her on Botox asap. Patient verbalized understanding, appreciation.

## 2022-07-13 NOTE — Addendum Note (Signed)
Addended by: Genia Harold on: 07/13/2022 10:19 AM   Modules accepted: Orders

## 2022-07-13 NOTE — Telephone Encounter (Signed)
Referral to pain clinic placed, thanks

## 2022-07-13 NOTE — Telephone Encounter (Signed)
Pt is calling. Stated she needs to talk to nurse about her  Erenumab-aooe ([place]) 29 MG/ML SOAJ. She also stated she never heard back from anyone about id she got approved for Botox.

## 2022-07-13 NOTE — Telephone Encounter (Signed)
Referral sent to [facility] [phone]

## 2022-07-14 ENCOUNTER — Encounter: Payer: Self-pay | Admitting: *Deleted

## 2022-07-14 MED ORDER — ONABOTULINUMTOXINA 200 UNITS IJ SOLR: 155.0000 [IU] | INTRAMUSCULAR | 6 refills | Status: DC

## 2022-07-14 NOTE — Telephone Encounter (Signed)
Faxed botox approval letter to [facility] specialty pharmacy. Received confirmation.

## 2022-07-14 NOTE — Telephone Encounter (Signed)
Rx sent, thanks 

## 2022-07-14 NOTE — Telephone Encounter (Signed)
Called [employer], spoke with rep who was unable to give specialty pharmacy. She gave # [phone], called #, reached [facility] PA dept, spoke with Aldona Bar who gave me [phone] [facility] provider line. Called #, spoke with Jenny Reichmann who transferred call to customer service, she stated to [facility] or [place].  Ref [id number]

## 2022-07-14 NOTE — Telephone Encounter (Signed)
Received fax form [employer]: Botox approved 07/13/22 - 01/10/2023, case # [id number].

## 2022-07-14 NOTE — Addendum Note (Signed)
Addended by: Genia Harold on: 07/14/2022 04:03 PM   Modules accepted: Orders

## 2022-07-16 ENCOUNTER — Telehealth: Payer: Self-pay | Admitting: Psychiatry

## 2022-07-16 NOTE — Telephone Encounter (Signed)
Inez Catalina from [facility] called stating that they have received the pt's referral but are unable to accept this pt.

## 2022-07-19 NOTE — Telephone Encounter (Signed)
Resent referral for pain clinic to [facility].Phone: [phone], Fax: [phone]

## 2022-07-21 NOTE — Telephone Encounter (Signed)
[place], # invalid, called # online for [facility]ova, reached [facility], per message her Botox order is still in process.

## 2022-08-02 NOTE — Telephone Encounter (Addendum)
Called [facility] pharmacy to schedule delivery of Botox. Spoke with Thereasa Parkin who stated  need to call help desk, [phone]. Showing botox is approved , but when trying to process Rx she gets a rejection. [place] management help desk, spoke with priscilla who transferred me to mail order team. Person I was connected to stated I need to talk to [place], # [phone]. She transferred call, reached [facility] PA dept, spoke with who stated botox is plan excluded in her pharmacy benefits. So PA cannot be done, appeal must be done and sent to [employer]. She'll start an initial benefit review letter for denial letter that states if we want it approved we have to submit appeal. She advised that if approved through medical benefits it may be able to be buy and bill.  Note routed to Rx Auth team.

## 2022-08-03 ENCOUNTER — Other Ambulatory Visit (HOSPITAL_COMMUNITY): Payer: Self-pay

## 2022-08-04 ENCOUNTER — Other Ambulatory Visit (HOSPITAL_COMMUNITY): Payer: Self-pay

## 2022-08-05 ENCOUNTER — Telehealth (HOSPITAL_COMMUNITY): Payer: Self-pay

## 2022-08-05 ENCOUNTER — Other Ambulatory Visit (HOSPITAL_COMMUNITY): Payer: Self-pay

## 2022-08-05 NOTE — Telephone Encounter (Signed)
[employer]otox[employer] verification submitted   Key#  [id number]

## 2022-08-09 ENCOUNTER — Other Ambulatory Visit (HOSPITAL_COMMUNITY): Payer: Self-pay

## 2022-08-09 ENCOUNTER — Telehealth: Payer: Self-pay | Admitting: Adult Health

## 2022-08-09 NOTE — Telephone Encounter (Signed)
Unable to leave VM due to VM box being full, sent mychart msg informing pt of initial botox appointment that was scheduled for 10/10 at 12:45pm

## 2022-08-09 NOTE — Telephone Encounter (Signed)
See phone note

## 2022-08-09 NOTE — Telephone Encounter (Signed)
Botox approved. [place] where Rx was sent, spoke with Lauro Regulus who stated I need to speak with office, p [phone]. She transferred call, spoke with [facility] with [facility] who stated Rx is still being rejected. She spoke with help desk, but I ended call. I got message from Rx Auth Pool: [employer] patient is approved through [place] not through [employer]. Message sent to [facility] and [place] to schedule her for B&B Botox injection.

## 2022-08-16 ENCOUNTER — Telehealth: Payer: Self-pay | Admitting: Adult Health

## 2022-08-16 NOTE — Telephone Encounter (Signed)
Anita Hodges will contact pt back as we have no information on the price of pts injection.

## 2022-08-16 NOTE — Progress Notes (Unsigned)
08/16/22 JM: Patient is being seen for initial Botox injection.    Consent Form Botulism Toxin Injection For Chronic Migraine    Reviewed orally with patient, additionally signature is on file:  Botulism toxin has been approved by the Federal drug administration for treatment of chronic migraine. Botulism toxin does not cure chronic migraine and it may not be effective in some patients.  The administration of botulism toxin is accomplished by injecting a small amount of toxin into the muscles of the neck and head. Dosage must be titrated for each individual. Any benefits resulting from botulism toxin tend to wear off after 3 months with a repeat injection required if benefit is to be maintained. Injections are usually done every 3-4 months with maximum effect peak achieved by about 2 or 3 weeks. Botulism toxin is expensive and you should be sure of what costs you will incur resulting from the injection.  The side effects of botulism toxin use for chronic migraine may include:   -Transient, and usually mild, facial weakness with facial injections  -Transient, and usually mild, head or neck weakness with head/neck injections  -Reduction or loss of forehead facial animation due to forehead muscle weakness  -Eyelid drooping  -Dry eye  -Pain at the site of injection or bruising at the site of injection  -Double vision  -Potential unknown long term risks   Contraindications: You should not have Botox if you are pregnant, nursing, allergic to albumin, have an infection, skin condition, or muscle weakness at the site of the injection, or have myasthenia gravis, Lambert-Eaton syndrome, or ALS.  It is also possible that as with any injection, there may be an allergic reaction or no effect from the medication. Reduced effectiveness after repeated injections is sometimes seen and rarely infection at the injection site may occur. All care will be taken to prevent these side effects. If therapy  is given over a long time, atrophy and wasting in the muscle injected may occur. Occasionally the patient's become refractory to treatment because they develop antibodies to the toxin. In this event, therapy needs to be modified.  I have read the above information and consent to the administration of botulism toxin.    BOTOX PROCEDURE NOTE FOR MIGRAINE HEADACHE  Contraindications and precautions discussed with patient(above). Aseptic procedure was observed and patient tolerated procedure. Procedure performed by Frann Rider, NP-BC.   The condition has existed for more than 6 months, and pt does not have a diagnosis of ALS, Myasthenia Gravis or Lambert-Eaton Syndrome.  Risks and benefits of injections discussed and pt agrees to proceed with the procedure.  Written consent obtained  These injections are medically necessary. Pt  receives good benefits from these injections. These injections do not cause sedations or hallucinations which the oral therapies may cause.   Description of procedure:  The patient was placed in a sitting position. The standard protocol was used for Botox as follows, with 5 units of Botox injected at each site:  -Procerus muscle, midline injection  -Corrugator muscle, bilateral injection  -Frontalis muscle, bilateral injection, with 2 sites each side, medial injection was performed in the upper one third of the frontalis muscle, in the region vertical from the medial inferior edge of the superior orbital rim. The lateral injection was again in the upper one third of the forehead vertically above the lateral limbus of the cornea, 1.5 cm lateral to the medial injection site.  -Temporalis muscle injection, 4 sites, bilaterally. The first injection was 3  cm above the tragus of the ear, second injection site was 1.5 cm to 3 cm up from the first injection site in line with the tragus of the ear. The third injection site was 1.5-3 cm forward between the first 2 injection  sites. The fourth injection site was 1.5 cm posterior to the second injection site. 5th site laterally in the temporalis  muscleat the level of the outer canthus.  -Occipitalis muscle injection, 3 sites, bilaterally. The first injection was done one half way between the occipital protuberance and the tip of the mastoid process behind the ear. The second injection site was done lateral and superior to the first, 1 fingerbreadth from the first injection. The third injection site was 1 fingerbreadth superiorly and medially from the first injection site.  -Cervical paraspinal muscle injection, 2 sites, bilaterally. The first injection site was 1 cm from the midline of the cervical spine, 3 cm inferior to the lower border of the occipital protuberance. The second injection site was 1.5 cm superiorly and laterally to the first injection site.  -Trapezius muscle injection was performed at 3 sites, bilaterally. The first injection site was in the upper trapezius muscle halfway between the inflection point of the neck, and the acromion. The second injection site was one half way between the acromion and the first injection site. The third injection was done between the first injection site and the inflection point of the neck.   Will return for repeat injection in 3 months.   A total of 200 units of Botox was prepared, 155 units of Botox was injected as documented above, any Botox not injected was wasted. The patient tolerated the procedure well, there were no complications of the above procedure.

## 2022-08-16 NOTE — Telephone Encounter (Signed)
Patient would like a call back regarding her cost of the Botox for apt tomorrow

## 2022-08-17 ENCOUNTER — Ambulatory Visit: Payer: 59 | Admitting: Adult Health

## 2022-08-19 NOTE — Progress Notes (Deleted)
08/19/22 JM: Patient is being seen for initial Botox injection.    Consent Form Botulism Toxin Injection For Chronic Migraine    Reviewed orally with patient, additionally signature is on file:  Botulism toxin has been approved by the Federal drug administration for treatment of chronic migraine. Botulism toxin does not cure chronic migraine and it may not be effective in some patients.  The administration of botulism toxin is accomplished by injecting a small amount of toxin into the muscles of the neck and head. Dosage must be titrated for each individual. Any benefits resulting from botulism toxin tend to wear off after 3 months with a repeat injection required if benefit is to be maintained. Injections are usually done every 3-4 months with maximum effect peak achieved by about 2 or 3 weeks. Botulism toxin is expensive and you should be sure of what costs you will incur resulting from the injection.  The side effects of botulism toxin use for chronic migraine may include:   -Transient, and usually mild, facial weakness with facial injections  -Transient, and usually mild, head or neck weakness with head/neck injections  -Reduction or loss of forehead facial animation due to forehead muscle weakness  -Eyelid drooping  -Dry eye  -Pain at the site of injection or bruising at the site of injection  -Double vision  -Potential unknown long term risks   Contraindications: You should not have Botox if you are pregnant, nursing, allergic to albumin, have an infection, skin condition, or muscle weakness at the site of the injection, or have myasthenia gravis, Lambert-Eaton syndrome, or ALS.  It is also possible that as with any injection, there may be an allergic reaction or no effect from the medication. Reduced effectiveness after repeated injections is sometimes seen and rarely infection at the injection site may occur. All care will be taken to prevent these side effects. If therapy  is given over a long time, atrophy and wasting in the muscle injected may occur. Occasionally the patient's become refractory to treatment because they develop antibodies to the toxin. In this event, therapy needs to be modified.  I have read the above information and consent to the administration of botulism toxin.    BOTOX PROCEDURE NOTE FOR MIGRAINE HEADACHE  Contraindications and precautions discussed with patient(above). Aseptic procedure was observed and patient tolerated procedure. Procedure performed by Frann Rider, NP-BC.   The condition has existed for more than 6 months, and pt does not have a diagnosis of ALS, Myasthenia Gravis or Lambert-Eaton Syndrome.  Risks and benefits of injections discussed and pt agrees to proceed with the procedure.  Written consent obtained  These injections are medically necessary. Pt  receives good benefits from these injections. These injections do not cause sedations or hallucinations which the oral therapies may cause.   Description of procedure:  The patient was placed in a sitting position. The standard protocol was used for Botox as follows, with 5 units of Botox injected at each site:  -Procerus muscle, midline injection  -Corrugator muscle, bilateral injection  -Frontalis muscle, bilateral injection, with 2 sites each side, medial injection was performed in the upper one third of the frontalis muscle, in the region vertical from the medial inferior edge of the superior orbital rim. The lateral injection was again in the upper one third of the forehead vertically above the lateral limbus of the cornea, 1.5 cm lateral to the medial injection site.  -Temporalis muscle injection, 4 sites, bilaterally. The first injection was 3  cm above the tragus of the ear, second injection site was 1.5 cm to 3 cm up from the first injection site in line with the tragus of the ear. The third injection site was 1.5-3 cm forward between the first 2 injection  sites. The fourth injection site was 1.5 cm posterior to the second injection site. 5th site laterally in the temporalis  muscleat the level of the outer canthus.  -Occipitalis muscle injection, 3 sites, bilaterally. The first injection was done one half way between the occipital protuberance and the tip of the mastoid process behind the ear. The second injection site was done lateral and superior to the first, 1 fingerbreadth from the first injection. The third injection site was 1 fingerbreadth superiorly and medially from the first injection site.  -Cervical paraspinal muscle injection, 2 sites, bilaterally. The first injection site was 1 cm from the midline of the cervical spine, 3 cm inferior to the lower border of the occipital protuberance. The second injection site was 1.5 cm superiorly and laterally to the first injection site.  -Trapezius muscle injection was performed at 3 sites, bilaterally. The first injection site was in the upper trapezius muscle halfway between the inflection point of the neck, and the acromion. The second injection site was one half way between the acromion and the first injection site. The third injection was done between the first injection site and the inflection point of the neck.   Will return for repeat injection in 3 months.   A total of 200 units of Botox was prepared, 155 units of Botox was injected as documented above, any Botox not injected was wasted. The patient tolerated the procedure well, there were no complications of the above procedure.

## 2022-08-23 ENCOUNTER — Ambulatory Visit: Payer: 59 | Admitting: Adult Health

## 2022-08-23 ENCOUNTER — Encounter: Payer: Self-pay | Admitting: Psychiatry

## 2022-08-24 NOTE — Telephone Encounter (Signed)
Resent referral to [facility] as requested by patient to see Dr. Suella Broad. Phone: [phone], Fax: [phone]

## 2022-10-11 DIAGNOSIS — M542 Cervicalgia: Secondary | ICD-10-CM | POA: Diagnosis not present

## 2022-10-11 DIAGNOSIS — M545 Low back pain, unspecified: Secondary | ICD-10-CM | POA: Diagnosis not present

## 2022-10-18 DIAGNOSIS — M545 Low back pain, unspecified: Secondary | ICD-10-CM | POA: Diagnosis not present

## 2022-10-18 DIAGNOSIS — M546 Pain in thoracic spine: Secondary | ICD-10-CM | POA: Diagnosis not present

## 2022-10-18 DIAGNOSIS — M2569 Stiffness of other specified joint, not elsewhere classified: Secondary | ICD-10-CM | POA: Diagnosis not present

## 2022-10-18 DIAGNOSIS — M542 Cervicalgia: Secondary | ICD-10-CM | POA: Diagnosis not present

## 2022-10-18 DIAGNOSIS — R531 Weakness: Secondary | ICD-10-CM | POA: Diagnosis not present

## 2022-10-22 DIAGNOSIS — M545 Low back pain, unspecified: Secondary | ICD-10-CM | POA: Diagnosis not present

## 2022-10-22 DIAGNOSIS — M546 Pain in thoracic spine: Secondary | ICD-10-CM | POA: Diagnosis not present

## 2022-10-22 DIAGNOSIS — M542 Cervicalgia: Secondary | ICD-10-CM | POA: Diagnosis not present

## 2022-10-22 DIAGNOSIS — M2569 Stiffness of other specified joint, not elsewhere classified: Secondary | ICD-10-CM | POA: Diagnosis not present

## 2022-10-22 DIAGNOSIS — R531 Weakness: Secondary | ICD-10-CM | POA: Diagnosis not present

## 2022-10-25 DIAGNOSIS — R531 Weakness: Secondary | ICD-10-CM | POA: Diagnosis not present

## 2022-10-25 DIAGNOSIS — M545 Low back pain, unspecified: Secondary | ICD-10-CM | POA: Diagnosis not present

## 2022-10-25 DIAGNOSIS — M2569 Stiffness of other specified joint, not elsewhere classified: Secondary | ICD-10-CM | POA: Diagnosis not present

## 2022-10-25 DIAGNOSIS — M542 Cervicalgia: Secondary | ICD-10-CM | POA: Diagnosis not present

## 2022-10-25 DIAGNOSIS — M546 Pain in thoracic spine: Secondary | ICD-10-CM | POA: Diagnosis not present

## 2022-11-15 ENCOUNTER — Ambulatory Visit: Payer: 59 | Admitting: Adult Health

## 2023-01-06 ENCOUNTER — Encounter: Payer: Self-pay | Admitting: Radiology

## 2023-01-17 ENCOUNTER — Other Ambulatory Visit (HOSPITAL_COMMUNITY): Payer: Self-pay

## 2023-05-10 ENCOUNTER — Other Ambulatory Visit (HOSPITAL_COMMUNITY): Payer: Self-pay

## 2023-05-10 ENCOUNTER — Telehealth: Payer: Self-pay

## 2023-05-10 NOTE — Telephone Encounter (Signed)
Patient Advocate Encounter   Received notification from [facility] [employer] that prior authorization is required for Bernita Raisin 100MG  tablets   Submitted: n/a Key [id number]  Awaiting clinical questions to generate

## 2023-05-11 NOTE — Telephone Encounter (Signed)
Clinical questions have been submitted-awaiting determination.

## 2023-05-17 ENCOUNTER — Other Ambulatory Visit (HOSPITAL_COMMUNITY): Payer: Self-pay

## 2023-05-17 NOTE — Telephone Encounter (Signed)
Received a faxed form requesting add information for Initial coverage-faxed completed forms and clinicals to [phone].

## 2023-05-20 ENCOUNTER — Other Ambulatory Visit (HOSPITAL_COMMUNITY): Payer: Self-pay

## 2023-05-20 NOTE — Telephone Encounter (Signed)
Pharmacy Patient Advocate Encounter  Received notification from [facility] that Prior Authorization for Ubrelvy 100MG  tablets has been APPROVED from 05/19/2023 to 08/03/2023.Marland Kitchen  PA #/Case ID/Reference #: PA Case ID #: [id number]

## 2023-08-12 ENCOUNTER — Telehealth: Payer: Self-pay

## 2023-08-12 NOTE — Telephone Encounter (Signed)
*  [facility]  Pharmacy Patient Advocate Encounter   Received notification from Cover[employer]eds that prior authorization for Ubrelvy 100MG  tablets  is required/requested.   Insurance verification completed.   The patient is insured through [facility] .   Per test claim: PA required; PA started via CoverMyMeds. KEY [id number] . Waiting for clinical questions to populate.

## 2023-08-26 NOTE — Telephone Encounter (Signed)
PA expired-renewed-  Pharmacy Patient Advocate Encounter   Received notification from Cover[employer]Meds that prior authorization for Ubrelvy 100MG  Tablets is required/requested.   Insurance verification completed.   The patient is insured through [facility] .   Per test claim: PA required; PA started via CoverMyMeds. KEY [id number] . Waiting for clinical questions to populate.

## 2023-08-30 NOTE — Telephone Encounter (Signed)
Received a faxed form requesting add info-faxed to [phone]  Also completed the clinical questions on CMM and submitted-awaiting determination.

## 2023-09-05 ENCOUNTER — Other Ambulatory Visit (HOSPITAL_COMMUNITY): Payer: Self-pay

## 2023-09-05 NOTE — Telephone Encounter (Signed)
Pharmacy Patient Advocate Encounter  Received notification from [facility] that Prior Authorization for Ubrelvy 100MG  tablets has been APPROVED from 08/12/2023 to 08/11/2024. Ran test claim, Copay is $0.00. This test claim was processed through [facility]- copay amounts may vary at other pharmacies due to pharmacy/plan contracts, or as the patient moves through the different stages of their insurance plan.   PA #/Case ID/Reference #: [id number]

## 2024-12-07 ENCOUNTER — Encounter: Payer: Self-pay | Admitting: Hematology

## 2024-12-07 ENCOUNTER — Inpatient Hospital Stay: Payer: Self-pay

## 2024-12-07 ENCOUNTER — Inpatient Hospital Stay: Payer: Self-pay | Attending: Hematology | Admitting: Hematology

## 2024-12-07 ENCOUNTER — Other Ambulatory Visit: Payer: Self-pay

## 2024-12-07 VITALS — BP 152/96 | HR 98 | Temp 98.1°F | Resp 16 | Ht 65.0 in | Wt >= 6400 oz

## 2024-12-07 DIAGNOSIS — D72828 Other elevated white blood cell count: Secondary | ICD-10-CM | POA: Diagnosis not present

## 2024-12-07 LAB — CBC WITH DIFFERENTIAL/PLATELET
Abs Immature Granulocytes: 0.05 10*3/uL (ref 0.00–0.07)
Basophils Absolute: 0 10*3/uL (ref 0.0–0.1)
Basophils Relative: 0 %
Eosinophils Absolute: 0.1 10*3/uL (ref 0.0–0.5)
Eosinophils Relative: 0 %
HCT: 41.3 % (ref 36.0–46.0)
Hemoglobin: 12.7 g/dL (ref 12.0–15.0)
Immature Granulocytes: 0 %
Lymphocytes Relative: 19 %
Lymphs Abs: 3 10*3/uL (ref 0.7–4.0)
MCH: 25.3 pg — ABNORMAL LOW (ref 26.0–34.0)
MCHC: 30.8 g/dL (ref 30.0–36.0)
MCV: 82.4 fL (ref 80.0–100.0)
Monocytes Absolute: 0.6 10*3/uL (ref 0.1–1.0)
Monocytes Relative: 4 %
Neutro Abs: 12.2 10*3/uL — ABNORMAL HIGH (ref 1.7–7.7)
Neutrophils Relative %: 77 %
Platelets: 469 10*3/uL — ABNORMAL HIGH (ref 150–400)
RBC: 5.01 MIL/uL (ref 3.87–5.11)
RDW: 15.6 % — ABNORMAL HIGH (ref 11.5–15.5)
WBC: 16 10*3/uL — ABNORMAL HIGH (ref 4.0–10.5)
nRBC: 0 % (ref 0.0–0.2)

## 2024-12-07 NOTE — Progress Notes (Signed)
 " [facility]   [phone]) [phone] Fax:([phone]   Clinic New Consult Note   Patient Care Team: Rolinda Millman, MD as PCP - General (Family Medicine) 12/07/2024  CHIEF COMPLAINTS/PURPOSE OF CONSULTATION:  Elevated WBC  REFERRING PHYSICIAN: Maree Isles, MD   Discussed the use of AI scribe software for clinical note transcription with the patient, who gave verbal consent to proceed.  History of Present Illness Anita Hodges is a 39 year old woman with chronic intermittent leukocytosis and neutrophilia who presents for evaluation of persistent mild leukocytosis.  She has had intermittent mild leukocytosis for at least ten years with [facility] in 11-13K range, with recent labs in September and December 2025 showing persistent mild leukocytosis (WBC 13.6 and 12.9 x10^9/L, normal 4-10), predominantly neutrophilic (ANC 9.1, normal 1.4-7), with occasional mild lymphocytosis (lymphocytes 4.0, normal upper limit 3.1). Platelets were mildly elevated at 459 x10^9/L (upper limit 450) once and hemoglobin has remained normal. Most labs were obtained for routine screening. She denies infectious symptoms such as fever or chills at the times of testing. She reports significant anxiety.  She has idiopathic intracranial hypertension with cerebral venous stenosis, previously with severe headaches, tinnitus, and photophobia that are now mild.  She is treated for hypertension with medication but did not take her antihypertensive on the day of the visit due to anxiety. She has generalized anxiety treated with buspirone. She previously received pregabalin , venlafaxine , and erenumab  for migraines but is no longer taking these. Current medications include meloxicam and pregabalin .  Menstrual cycles are irregular with spotting followed by heavier bleeding lasting up to four days, and she has dizziness with menses. She has chronic sinus congestion and seasonal allergies with sneezing and rhinorrhea.  She denies thrombosis, tobacco, alcohol, vaping, and recreational drug use.     MEDICAL HISTORY:  Past Medical History:  Diagnosis Date   Allergy    Anxiety    Hypertension    IIH (idiopathic intracranial hypertension)    Migraine    Obesity     SURGICAL HISTORY: Past Surgical History:  Procedure Laterality Date   CHOLECYSTECTOMY N/A 12/27/2018   Procedure: LAPAROSCOPIC CHOLECYSTECTOMY;  Surgeon: Mavis Anes, MD;  Location: [facility] ORS;  Service: General;  Laterality: N/A;  pt knows to arrive at 6:15   COLONOSCOPY WITH PROPOFOL  N/A 07/23/2019   Procedure: COLONOSCOPY WITH PROPOFOL ;  Surgeon: Shaaron Lamar HERO, MD;  Location: [facility];  Service: Endoscopy;  Laterality: N/A;  1:15pm   ESOPHAGOGASTRODUODENOSCOPY (EGD) WITH PROPOFOL  N/A 12/18/2018   mild erosive reflux esophagitis, normal stomach, normal duodenum.    POLYPECTOMY  07/23/2019   Procedure: POLYPECTOMY;  Surgeon: Shaaron Lamar HERO, MD;  Location: [facility];  Service: Endoscopy;;   TONSILLECTOMY     WISDOM TOOTH EXTRACTION      SOCIAL HISTORY: Social History   Socioeconomic History   Marital status: Single    Spouse name: Not on file   Number of children: 0   Years of education: college   Highest education level: Bachelor's degree (e.g., BA, AB, BS)  Occupational History   Occupation: print production planner  Tobacco Use   Smoking status: Never   Smokeless tobacco: Never  Vaping Use   Vaping status: Never Used  Substance and Sexual Activity   Alcohol use: No    Alcohol/week: 0.0 standard drinks of alcohol   Drug use: No   Sexual activity: Yes    Partners: Male    Comment: 1st intercourse- 3, partners- 8, current partner- 4 months  Other Topics Concern   Not on file  Social History Narrative   Lives with boyfriend.   Right-handed.   Three cups caffeine per day.      Social Drivers of Health   Tobacco Use: Low Risk (12/07/2024)   Patient History    Smoking Tobacco Use: Never    Smokeless Tobacco Use:  Never    Passive Exposure: Not on file  Financial Resource Strain: Not on file  Food Insecurity: No Food Insecurity (12/07/2024)   Epic    Worried About Programme Researcher, Broadcasting/film/video in the Last Year: Never true    Ran Out of Food in the Last Year: Never true  Transportation Needs: No Transportation Needs (12/07/2024)   Epic    Lack of Transportation (Medical): No    Lack of Transportation (Non-Medical): No  Physical Activity: Insufficiently Active (09/15/2023)   Received from [facility]   Exercise Vital Sign    On average, how many days per week do you engage in moderate to strenuous exercise (like a brisk walk)?: 2 days    On average, how many minutes do you engage in exercise at this level?: 30 min  Stress: No Stress Concern Present (09/15/2023)   Received from [facility] - Occupational Stress Questionnaire    Feeling of Stress : Only a little  Social Connections: Unknown (03/08/2022)   [facility]   Social Network    Social Network: Not on file  Intimate Partner Violence: Not At Risk (09/15/2023)   Received from [facility]   Epic    Within the last year, have you been afraid of your partner or ex-partner?: No    Within the last year, have you been humiliated or emotionally abused in other ways by your partner or ex-partner?: No    Within the last year, have you been kicked, hit, slapped, or otherwise physically hurt by your partner or ex-partner?: No    Within the last year, have you been raped or forced to have any kind of sexual activity by your partner or ex-partner?: No  Depression (PHQ2-9): High Risk (12/07/2024)   Depression (PHQ2-9)    PHQ-2 Score: 16  Alcohol Screen: Not on file  Housing: Unknown (12/07/2024)   Epic    Unable to Pay for Housing in the Last Year: Patient declined    Number of Times Moved in the Last Year: Not on file    Homeless in the Last Year: No  Utilities: Not At Risk (12/07/2024)   Epic     Threatened with loss of utilities: No  Health Literacy: Low Risk (09/15/2023)   Received from [facility] Literacy    How often do you need to have someone help you when you read instructions, pamphlets, or other written material from your doctor or pharmacy?: Never    FAMILY HISTORY: Family History  Problem Relation Age of Onset   Hypertension Mother    Colon polyps Mother        unknown age of onset   Gallbladder disease Mother    Gallbladder disease Father    COPD Father    Cancer Maternal Grandfather        NHL   Cancer Paternal Grandmother        lung cancer   Colon cancer Neg Hx     ALLERGIES:  is allergic to codeine and topiramate.  MEDICATIONS:  Current Outpatient Medications  Medication  Sig Dispense Refill   albuterol  (PROVENTIL ) (2.5 MG/3ML) 0.083% nebulizer solution Take 3 mLs (2.5 mg total) by nebulization every 6 (six) hours as needed for wheezing or shortness of breath. 75 mL 2   albuterol  (VENTOLIN  HFA) 108 (90 Base) MCG/ACT inhaler Inhale 1-2 puffs into the lungs every 6 (six) hours as needed for wheezing or shortness of breath. 18 g 0   budesonide -formoterol  (SYMBICORT ) 160-4.5 MCG/ACT inhaler Inhale 2 puffs into the lungs 2 (two) times daily. 1 each 6   levocetirizine (XYZAL) 5 MG tablet Take 5 mg by mouth every morning.      methocarbamol  (ROBAXIN ) 500 MG tablet Take 1 tablet (500 mg total) by mouth every 8 (eight) hours as needed for muscle spasms. 90 tablet 3   montelukast  (SINGULAIR ) 10 MG tablet Take 10 mg by mouth at bedtime.     ondansetron  (ZOFRAN -ODT) 4 MG disintegrating tablet Take 1 tablet (4 mg total) by mouth every 8 (eight) hours as needed for nausea or vomiting. 15 tablet 0   promethazine  (PHENERGAN ) 25 MG tablet Take 0.5 tablets (12.5 mg total) by mouth every 6 (six) hours as needed for nausea or vomiting. 30 tablet 0   Ubrogepant  (UBRELVY ) 100 MG TABS Take 100 mg by mouth as needed. Can repeat a dose in 2 hours if headache persists.  Max dose 2 pills in 24 hours 16 tablet 6   No current facility-administered medications for this visit.    REVIEW OF SYSTEMS:   Constitutional: Denies fevers, chills or abnormal night sweats Eyes: Denies blurriness of vision, double vision or watery eyes Ears, nose, mouth, throat, and face: Denies mucositis or sore throat Respiratory: Denies cough, dyspnea or wheezes Cardiovascular: Denies palpitation, chest discomfort or lower extremity swelling Gastrointestinal:  Denies nausea, heartburn or change in bowel habits Skin: Denies abnormal skin rashes Lymphatics: Denies new lymphadenopathy or easy bruising Neurological:Denies numbness, tingling or new weaknesses Behavioral/Psych: Mood is stable, no new changes  All other systems were reviewed with the patient and are negative.  PHYSICAL EXAMINATION: ECOG PERFORMANCE STATUS: 1 - Symptomatic but completely ambulatory  Vitals:   12/07/24 1413  BP: (!) 152/96  Pulse: 98  Resp: 16  Temp: 98.1 F (36.7 C)  SpO2: 97%   Filed Weights   12/07/24 1413  Weight: (!) 423 lb 1 oz (191.9 kg)    GENERAL:alert, no distress and comfortable SKIN: skin color, texture, turgor are normal, no rashes or significant lesions EYES: normal, conjunctiva are pink and non-injected, sclera clear OROPHARYNX:no exudate, no erythema and lips, buccal mucosa, and tongue normal  NECK: supple, thyroid  normal size, non-tender, without nodularity LYMPH:  no palpable lymphadenopathy in the cervical, axillary or inguinal LUNGS: clear to auscultation and percussion with normal breathing effort HEART: regular rate & rhythm and no murmurs and no lower extremity edema ABDOMEN:abdomen soft, non-tender and normal bowel sounds Musculoskeletal:no cyanosis of digits and no clubbing  PSYCH: alert & oriented x 3 with fluent speech NEURO: no focal motor/sensory deficits  Physical Exam CARDIOVASCULAR: Heart normal ABDOMEN: Tenderness in the upper right and left quadrants,  abdomen soft  LABORATORY DATA:  I have reviewed the data as listed    Latest Ref Rng & Units 12/01/2021    3:37 PM 06/03/2021    6:41 PM 07/03/2019    4:41 PM  CBC  WBC 4.0 - 10.5 K/uL 9.5  11.2  9.6   Hemoglobin 12.0 - 15.0 g/dL 86.8  86.3  86.6   Hematocrit 36.0 - 46.0 % 39.8  41.6  42.4   Platelets 150 - 400 K/uL 337  384  358       Latest Ref Rng & Units 12/01/2021    3:37 PM 06/03/2021    6:41 PM 07/03/2019    4:41 PM  CMP  Glucose 70 - 99 mg/dL 97  89  897   BUN 6 - 20 mg/dL 8  9  10    Creatinine 0.44 - 1.00 mg/dL 9.42  9.40  9.29   Sodium 135 - 145 mmol/L 138  137  137   Potassium 3.5 - 5.1 mmol/L 3.9  4.5  4.0   Chloride 98 - 111 mmol/L 100  101  104   CO2 22 - 32 mmol/L 28  21  26    Calcium 8.9 - 10.3 mg/dL 8.7  8.6  8.8   Total Protein 6.5 - 8.1 g/dL 7.0  6.6  7.3   Total Bilirubin 0.3 - 1.2 mg/dL 0.8  0.3  0.6   Alkaline Phos 38 - 126 U/L 92  119  97   AST 15 - 41 U/L 30  22  28    ALT 0 - 44 U/L 34  21  37      RADIOGRAPHIC STUDIES: I have personally reviewed the radiological images as listed and agreed with the findings in the report. No results found.  Assessment & Plan Leukocytosis with neutrophilia Chronic, intermittent, mild leukocytosis with neutrophilia has persisted for over ten years, with white blood cell counts only slightly above the upper limit of normal and periods of normalization. The chronicity, absence of symptoms, and lack of abnormal cells on peripheral smear suggest a benign, reactive etiology such as chronic inflammation or allergy. No high clinical suspicion for chronic myeloid leukemia or rheumatologic disease based on history and absence of suggestive symptoms. Mild, transient thrombocytosis is likely secondary to menstrual blood loss. No evidence of hematologic malignancy or rheumatologic disorder at this time. - Ordered repeat complete blood count with differential to reassess leukocytosis and neutrophilia. - Ordered BCR-ABL gene mutation  testing to exclude chronic myeloid leukemia. - Provided education regarding the benign nature of leukocytosis and the potential roles of inflammation, allergy, and other reactive causes. - Advised that if BCR-ABL testing is negative, no further hematology follow-up is required; recommended annual CBC monitoring. - No treatment indicated unless new symptoms develop. - Provided reassurance regarding the low likelihood of malignancy or rheumatologic disease given her clinical presentation.  Plan -lab today and I will call her with results -if BCR/ABL negartive, I will see her as needed    Orders Placed This Encounter  Procedures   CBC with Differential (Cancer Center Only)    Standing Status:   Future    Expected Date:   12/07/2024    Expiration Date:   12/07/2025   BCR-ABL1 FISH    Standing Status:   Future    Number of Occurrences:   1    Expected Date:   12/07/2024    Expiration Date:   12/07/2025    All questions were answered. The patient knows to call the clinic with any problems, questions or concerns. I spent 25 minutes counseling the patient face to face. The total time spent in the appointment was 30 minutes including review of chart and various tests results, discussions about plan of care and coordination of care plan.     Onita Mattock, MD 12/07/2024 2:47 PM    "

## 2024-12-11 LAB — BCR-ABL1 FISH
Cells Analyzed: 200
Cells Counted: 200
